# Patient Record
Sex: Female | Born: 1955 | Race: White | Hispanic: No | Smoking: Former smoker
Health system: Southern US, Community
[De-identification: ages and names within clinical notes are randomized; demographics above are authoritative.]

## PROBLEM LIST (undated history)

## (undated) DIAGNOSIS — M1712 Unilateral primary osteoarthritis, left knee: Secondary | ICD-10-CM

## (undated) DIAGNOSIS — M1990 Unspecified osteoarthritis, unspecified site: Secondary | ICD-10-CM

## (undated) DIAGNOSIS — E119 Type 2 diabetes mellitus without complications: Secondary | ICD-10-CM

## (undated) DIAGNOSIS — C801 Malignant (primary) neoplasm, unspecified: Secondary | ICD-10-CM

## (undated) DIAGNOSIS — F419 Anxiety disorder, unspecified: Secondary | ICD-10-CM

## (undated) DIAGNOSIS — F32A Depression, unspecified: Secondary | ICD-10-CM

## (undated) DIAGNOSIS — F329 Major depressive disorder, single episode, unspecified: Secondary | ICD-10-CM

## (undated) DIAGNOSIS — I10 Essential (primary) hypertension: Secondary | ICD-10-CM

## (undated) DIAGNOSIS — R55 Syncope and collapse: Secondary | ICD-10-CM

## (undated) DIAGNOSIS — Z8489 Family history of other specified conditions: Secondary | ICD-10-CM

## (undated) DIAGNOSIS — J189 Pneumonia, unspecified organism: Secondary | ICD-10-CM

## (undated) DIAGNOSIS — E7800 Pure hypercholesterolemia, unspecified: Secondary | ICD-10-CM

## (undated) DIAGNOSIS — J45909 Unspecified asthma, uncomplicated: Secondary | ICD-10-CM

## (undated) HISTORY — PX: ABDOMINAL HYSTERECTOMY: SHX81

## (undated) HISTORY — DX: Pure hypercholesterolemia, unspecified: E78.00

## (undated) HISTORY — PX: JOINT REPLACEMENT: SHX530

## (undated) HISTORY — PX: DILATION AND CURETTAGE OF UTERUS: SHX78

## (undated) HISTORY — PX: BACK SURGERY: SHX140

## (undated) NOTE — Assessment & Plan Note (Signed)
 Formatting of this note might be different from the original.  Problem: Acute Pain Goal: Improve-Acute Pain Description: Pain that persists over time -- Improve Flowsheets (Taken 01/10/2024 0403) Acute pain outcomes: Stabilizing  Electronically signed by Joen LITTIE Pinal at 01/10/2024  4:03 EST

## (undated) NOTE — Assessment & Plan Note (Signed)
 Formatting of this note is different from the original. Images from the original note were not included. Physical Therapy Evaluation-Inpatient  Patient Name:  Tiffany Robles   Date of Birth:  Oct 27, 1956  MRN: [id number]    LOS: 0 days Attending Physician: Ramiro LITTIE Clerk, MD  Problem List Patient Active Problem List  Diagnosis   Closed fracture of right tibial plateau   Past Medical History Past Medical History:   Anxiety   Chronic kidney disease (CKD), stage III (moderate) (CMS-HCC)   Diabetes mellitus (CMS, HHS-HCC)   Hyperlipidemia   Hypertension   Neuropathy   Past Surgical History Past Surgical History:  Procedure Laterality Date   BACK SURGERY     HYSTERECTOMY         ICD-10-CM   1. Closed fracture of lateral portion of right tibial plateau, initial encounter  S82.121A    2. Acute pain of right knee  M25.561    3. Impaired mobility and ADLs  Z74.09    Z78.9       Precautions Weight bearing right lower extremity: Non-weight bearing Bracing: R Hinged knee (unlocked) General precautions: Fall;Skin Other (comment): right tib plat fx (non op) unlocked hinged knee brace, PMHx: L TKA(approximately 3 years ago), anxiety, neuropathy  Subjective/Pain Assessment   Pt. Met in ED hallway, pleasant and agreeable to therapy. I just want to go home. Pt. With high levels of R knee pain with all mobility, improved with rest. RN aware.   Objective Evaluation    Cognition - 01/08/24 1729      Cognition   Overall cognitive status Within Functional Limits    Direction following Exceptions to [facility]    Exceptions to direction following Multi-step   extra time.   Attention span Attends with cues to redirect    Following commands Follows multi-step commands with increased time;Follows multi-step commands with repetition;Follows one-step commands without difficulty    Safety judgement Decreased awareness of need for assistance     Communication   Communication Within  functional limits          Home/PLOF - 01/08/24 1728      Living Situation   Lives with Significant other    Type of home House    Home layout One-level    Entrance Stairs    Exterior stairs 2    Bathroom shower/tub Walk-in shower    Bathroom toilet Tall toilet    Bathroom equipment Grab bars    Home Equipment Walker, front wheel    Other (comment) FWW from TKA, has not used recently.     Level of ADL Independence   Level of independence Independence with ADLs;Independence with transfers    General mobility Independence with ambulation    Other (comment) Was in [place] on vacation, fall during tub transfer.          Lower Extremity - 01/08/24 1730      RLE Assessment   RLE assessment Exceptions to [facility]   ankle PF/DF 4/5, unable to perform SLR but can perform quad set.    LLE Assessment   LLE assessment Within Functional Limits          Sensation - 01/08/24 1731      Sensation   Light touch No apparent deficits          Bed Mobility/Transfers - 01/08/24 1731      Bed Mobility   Supine to sit Moderate assist;Head of bed elevated;Rails used   extra time, assist with RLE   Sit  to supine Moderate assist;Rails used   extra time   Scooting Maximum assist;Two person   to [facility]   Lateral Moderate assist;Two person;Left   extra time, pt. only able to perform one short seated side scoot 2/2 pain    Transfers   Sit to stand Moderate assist;Two person   initially 2p modA for half stand, with time and education on goal for d/c,pt. was able to fully upright to [facility]   Assistive device used for sit to stand Walker, front wheel    Stand to sit Moderate assist    Assistive device used for stand to sit Vannie, front wheel          GaitWC Skills - 01/08/24 1733      Gait Skills   Level of independence Moderate assist (50% patient's effort)    Weight-bearing restrictions Nonweight-bearing, right    Assistive device Walker, front wheel    Gait distance 1  heel-toe side step with FWW, pt. unable to hop or complete functional side step or pivot; quickly fatigues     Gait Analysis   Impairments contributing to gait deviations Decreased strength;Decreased endurance;Acute pain          Balance - 01/08/24 1734      Balance   Static sitting balance Contact guard assist    Dynamic sitting balance Contact guard assist;Minimal assist   2/2 pain and pt. not wanting to fully place RLE on floor   Static standing balance Contact guard assist;Minimal assist   with FWW   Dynamic standing balance Minimal assist   FWW         Assessment    Assessment - 01/08/24 1735      Assessment   Mobility assessment Decline in ambulation;Decreased ADL status;Decreased LE strength;Decreased safe judgement;Decreased activity tolerance;Decreased self-care;Decreased high-level ADLs;Decreased functional ability;Decreased balance;Fall risk;Gait instability;Generalized weakness;Impaired gait;Impaired transfers;Decreased LE ROM    Overall assessment Pt. presented to ED s/p fall from shower while on vacation in [place]. At baseline, pt. is IND with amb and all ADLs. Today, she presents below mobility baseline 2/2 high levels of pain, new RLE NWB status, and decreased activity tolerance. She will benefit from continued PT to progress her mobility. She hopes to d/c back to hotel and then drive home to [facility] tomorrow. Long discussion with pt. and boyfriend regarding the assist level pt. required with hospital bed and inability to fully stand pivot to get to w/c. With discussion, pt. agreeable to continued PT.    Rehab potential Good for stated goals    Prognosis Good;With continued PT status post acute discharge;Ongoing PT assessment needed          Goals - 01/08/24 1743      Short Term Goals   Short term goal 1 sup<>sit CGA to improve bed mobility    Short term goal 2 sit<>stand to FWW minA to improve functional mobility    Short term goal 3 bed<>w/c transfer minA  to improve transfers and mobility    Short term goal 4 Demos understanding of NWB RLE with all mobility.     Long Term Goals   Long term goal 1 Return to PLOF without falls or complications.    Anticipated to be met by end of hospital stay No         Plan/Recommendations    Plan/Recommendations - 01/08/24 1745      Plan/Recommendations   Treatment/Interventions Gait training;Transfer training;Bed mobility;Therapeutic exercise;Therapeutic activity;Strengthening;Endurance;Patient/Family education;Neuro re-education;Balance;Wheelchair mobility;Equipment evaluation/education    PT IP frequency  3-5x/week;5-7x/week    Duration LOS or until goals met     Recommendations   PT Discharge recommendations Supervision/assistance recommended for mobility and ADL's;Post-acute therapy needed to maximize functional status given current deficits    Post-acute therapy recommendation Patient needs therapy at 3-5 days per week.   Pt. wishes to d/c home with [facility] however, not able to complete transfers at eval, would not be safe to d/c at this time.    Dietitian, front wheel;Wheelchair   R elevating leg rest        Time Calculation  Eval Time Calculation - 01/08/24 1747      PT Eval Time Calculation   Start time 1630    Stop time 1645    Time calculation (min) 15          Treatment Time Calculation     Row Name 01/08/24 1747    PT Visit Status   PT Visit Status --    PT Treat Time Calculation   Start time 1645   Stop time 1700   Time calculation (min) 15        Goals and treatment plan discussed and agreed upon with patient and family  Treatment will continue unless new orders are written  High complexity due to 3 or more social or personal factors impacting care including patient is past/current experience, behavior pattern, prior living conditions, prior physical limitations. Please see above for details for prior living  environment and PLOF. Patient demonstrates with four or more elements from any of the following body structures and function, activity limitation, and/or participation restrictions that needs to be addressed during the evaluation including the following decreased functional mobility, difficulty with transfers/ambulation, abilities to complete ADLs. Please see above for details about PMH and problem list. Patient demonstrated with unstable clinical presentation with changing characteristics.   Evaluation completed for 15 minutes with treatment followed by 15 minutes for TA to facilitate return to PLOF   Education:  Role, purpose, goals of acute PT. Educated on safety awareness, energy conservation, and fall prevention during functional mobility. Pt verbalized understanding.   Supine > Sit:  modA, cued pt. To complete quad set prior to moving RLE towards midline to exit bed to her L, cues for deep nasal breathing to avoid holding breath, needs assist at R heel, pt. Very fearful of R knee bending/pain.   Sitting balance:  minA 2/2 pain, progressed to SBA, needs assist using draw sheet and chuck to get feet to floor.   Sit < > stand: x2 attempts from slightly elevated EOB. First attempt modA x2 to FWW, pt. Unable to come to full stand with hip and trunk flexion. Second attempt, after discussion that pt. Would need to be able to at least stand to d/c tonight, pt. Able to complete full stand modA x1 + minA x1 to FWW.   Ambulation:  Pt. Unable to complete with RLE NWB, did take one heel-toe side step with FWW but quickly fatigued and needed to sit back down.   Sit > Supine:  modA to scoop legs and assist at trunk.modA x2 to boost in bed.   Tolerance to activity: fair  Pt left resting in bed, sig other at bedside, aware to ask Rns for any other needs.    [place] M Pester, PT 01/08/2024 17:48   Cosigned by Ramiro LITTIE Clerk, MD at 01/09/2024 16:45 EST Electronically signed by [place] M Pester at 01/08/2024  17:52 EST Electronically signed by  Ramiro LITTIE Clerk, MD at 01/09/2024 16:45 EST

## (undated) NOTE — Consults (Signed)
 Formatting of this note is different from the original.    Orthopaedic Trauma Surgery Consult  Date of Consult: 01/08/2024   Requesting Clinician: Dr. Niki  Reason for Consult: Right tibial plateau fracture  Subjective:    CC:  Knee Pain (Pt c/o R knee pain after slipping getting out of shower. Unable to bear weight. Denies hitting head/ blood thinners. )   HPI:  15 y.o. female with a PMH of HTN and DM2 who presents emergency department with complaint of right knee pain status post fall.  Patient was getting out of the shower when she slipped and fell sustaining injury to the right knee.  Upon presentation she was evaluated by the emergency department team where imaging was obtained and she was diagnosed with a right tibial plateau fracture.  Orthopedics has been consulted for the patient's right knee fracture.  The patient was seen in the emergency department with no family present at the bedside.  Patient reports right knee pain.  Pain is elicited with direct pressure placed about the proximal tibia and knee as well as movement of the right knee.  Pain improves with immobilization and pain medication.  Pain does not radiate from the knee.  She reports no numbness or tingling of the right leg.  She denies any additional complaints.  She does report sustaining prior right knee fracture in the past which was treated without surgery.    PAST MEDICAL HISTORY Past Medical History:   Anxiety   Diabetes mellitus (CMS, HHS-HCC)   Hypertension   PAST SURGICAL HISTORY Past Surgical History:  Procedure Laterality Date   BACK SURGERY     HYSTERECTOMY     -Left total knee arthroplasty - [place]   MEDICATIONS No current facility-administered medications for this encounter.   No current outpatient medications on file.   Prior to Admission medications   Not on File    ALLERGIES Not on File  SOCIAL HISTORY Social History   Socioeconomic History   Marital status: Single  Tobacco  Use   Smoking status: Unknown   FAMILY HISTORY No family history on file.    REVIEW OF SYSTEMS:  General: neg fever, chills, weight changes, no new food allergies HEENT: neg swollen lymph nodes, congestion, dental problems, environmental allergies, eye discharge, vision changes, hearing loss CV: neg chest pain, irregular heartbeat Resp: neg cough, cough with blood, shortness of breath, wheezing GI: neg abdominal pain, bloody or black tarry stool, diarrhea, nausea or vomiting GU: neg difficulty urinating, loss of bladder control, pain or burning with urination, blood in urine   Endocrine: neg excessive appetite, excessive thirst, excessive urination Musc:  Positive right knee pain. Neuro: neg fainting spells, numbness, headaches or migraines Psych: neg agitation, memory changes  Hemo: neg easy bruising, bleeding tendencies  Objective:    BP 144/76   Pulse 92   Temp 36.6 C (97.9 F)   Resp 16   Ht 1.6 m (5' 3)   Wt 210 lb   SpO2 92%   BMI 37.20 kg/m   Physical Exam:  - General: Healthy appearing.  Well-developed.  Well-nourished.  Apparent age appears consistent with stated age.  No acute distress.  Cooperative.  Pleasant and conversational.  Obese.  - Psych: Alert and oriented x3.  Normal affect.  Normal mood.   Examination of the RUE:   -SKIN: No erythema or ecchymosis. No skin lesions or masses.    -MUSCULOSKELETAL:  No obvious deformity.  No tenderness to palpation of the hand, wrist, radius,  ulna, humerus, or shoulder girdle.  No crepitus with palpation of the limb.   No swelling noted.  Active range of motion of the shoulder, elbow, wrist, and hand pain free and full.  No mechanical blocks or crepitus with motion of the shoulder, elbow, wrist, or hand.  -NEURO: Sensation to light touch intact in the distributions of the radial, median, ulnar, and axillary nerves.  Positive extensor pollicis longus, abductor digiti minimi, abductor pollicis brevis, and interosseous  muscle active motion.    -VASCULAR:  Capillary refill <2 seconds. 2+ radial pulse. No palpable edema.   Examination of the LUE:    -SKIN: No erythema or ecchymosis. No skin lesions or masses.     -MUSCULOSKELETAL:  No obvious deformity.  No tenderness to palpation of the hand, wrist, radius, ulna, humerus, or shoulder girdle.  No crepitus with palpation of the limb.   No swelling noted.  Active range of motion of the shoulder, elbow, wrist, and hand pain free and full.  No mechanical blocks or crepitus with motion of the shoulder, elbow, wrist, or hand.  -NEURO: Sensation to light touch intact in the distributions of the radial, median, ulnar, and axillary nerves.  Positive extensor pollicis longus, abductor digiti minimi, abductor pollicis brevis, and interosseous muscle active motion.    -VASCULAR:  Capillary refill <2 seconds. 2+ radial pulse. No palpable edema.   Examination of the RLE:   -SKIN:  Swelling present of the knee.  No erythema or ecchymosis.  No skin lesions or masses.     -MUSCULOSKELETAL:  Tenderness to palpation about the knee and proximal tibia.  No additional tenderness to palpation about the limb.  No crepitus with palpation of the limb.  All compartments of the lower extremity are soft and compressible.  No pain with passive range of motion of the toes in flexion or extension.  Active range of motion of the hip, ankle, and foot are pain free.  Range of motion of the knee elicits pain.  No hip pain with log roll.     -NEURO: Sensation to light touch intact in the distributions of the superficial peroneal nerve, deep peroneal nerve, and tibial nerve distributions.  Positive extensor hallucis longus, tibialis anterior, and gastrocsoleus complex active motion.    -VASCULAR: Capillary refill <2 seconds. 2+ dorsalis pedis pulse.  No palpable edema.  Examination of the LLE:   -SKIN: No erythema or ecchymosis.  No skin lesions or masses.  -MUSCULOSKELETAL:  No tenderness to palpation  of the foot, ankle, tibia, fibula, patella, femur, or hip.  No crepitus with palpation of the limb.  No gross swelling noted.  All compartments of the lower extremity are soft and compressible.  No pain with passive range of motion of the toes in flexion or extension.  Active range of motion of the hip, knee, ankle, and foot pain free.  No pain with log roll.  Patient able to perform straight leg raise.    -NEURO: Sensation to light touch intact in the distributions of the superficial peroneal nerve, deep peroneal nerve, and tibial nerve distributions.  Positive extensor hallucis longus, tibialis anterior, and gastrocsoleus complex active motion.    -VASCULAR: Capillary refill <2 seconds. 2+ dorsalis pedis pulse.  No palpable edema.  PELVIS: Stable to compression.  No pain with compression of the pelvis.  GAIT: Unable to assess at this time.    IMAGING: X-ray imaging of the right tibia/fibula personally reviewed.  Lateral depression fracture noted of the tibial plateau.  No dislocations noted.  No additional fractures noted.  CT imaging of the right knee personally reviewed.  There is a posterior lateral depression fracture of the right tibial plateau.  No additional fractures or dislocations are noted.  Radiology: X-ray hip right with pelvis 2-3 views  Result Date: 01/07/2024 Cortical irregularity along the lateral tibial plateau better appreciated on this study, suspicious for nondepressed fracture. Interpreted By: Belvie Mullinix                                 Electronically signed by: Belvie Mullinix  01/07/2024 11:10 [id number] Report created in [facility] [employer].   X-ray femur right 2 views  Result Date: 01/07/2024 Cortical irregularity along the lateral tibial plateau better appreciated on this study, suspicious for nondepressed fracture. Interpreted By: Belvie Mullinix                                 Electronically signed by: Belvie Mullinix  01/07/2024 11:10 [id number] Report  created in [facility] [employer].   X-ray tibia fibula right AP and lateral  Result Date: 01/07/2024 Cortical irregularity along the lateral tibial plateau better appreciated on this study, suspicious for nondepressed fracture. Interpreted By: Belvie Mullinix                                 Electronically signed by: Belvie Mullinix  01/07/2024 11:10 [id number] Report created in [facility] [employer].   X-ray knee right AP lateral and axial  Result Date: 01/07/2024 No acute bony injury. Mild degenerative changes of the knee, with small knee joint effusion. Interpreted By: Belvie Mullinix                                 Electronically signed by: Belvie Mullinix  01/07/2024 9:02 [id number] Report created in [facility] [employer].    Assessment & Plan    Diagnosis:   Closed right tibial plateau fracture  Plan:   Injury discussed with the patient.  Treatment options in the form of operative and non operative management were discussed.  The patient may benefit from surgical fixation right tibial plateau fracture.  Recommend she remain nonweightbearing to the right lower extremity.  Further treatment planning will follow once advanced imaging is further reviewed by our team.  She is being admitted to the hospitalist service for medical management and pain control.  Hold chemical prophylaxis at this time.  Remain NPO at this time.  Knee immobilizer applied to the right lower extremity at the bedside, this is to remain in place at this time.  Further treatment planning to follow up.   Deward Mosie Raddle., PA 01/08/24 0408   Charlie JONETTA Pouch, MD 01/08/24 1237  Cosigned by Charlie JONETTA Pouch, MD at 01/08/2024 12:37 EST Electronically signed by Deward Mosie Raddle., PA at 01/08/2024  4:05 EST Electronically signed by Deward Mosie Raddle., PA at 01/08/2024  4:08 EST Electronically signed by Charlie JONETTA Pouch, MD at 01/08/2024 12:37 EST  Associated attestation - Pouch Charlie JONETTA, MD - 01/08/2024 1237 EST Formatting  of this note might be different from the original. I have seen and examined Ms. Essner.  I have reviewed all available imaging.  I agree with the above findings, assessment, and plan of care.   The following  portions of the patient's history were reviewed and confirmed with the patient: allergies, current medications, past family history, past medical history, past social history and past surgical history.  Physical exam:  Right upper extremity:  No appreciable skin lacerations, abrasions, ecchymoses, edema Full range of motion of the shoulder, elbow, wrist, fingers Nontender to palpation over the long bones or bony prominences. 5/5 strength with thumb extension, A-Okay sign, finger abduction  Sensation intact to light touch 2/2 in the radial, median, ulnar nerve distributions Radial pulse 2 +, capillary refill less than 2 seconds  Left upper extremity:  No appreciable skin lacerations, abrasions, ecchymoses, edema Full range of motion of the shoulder, elbow, wrist, fingers Nontender to palpation over the long bones or bony prominences. 5/5 strength with thumb extension, A-Okay sign, finger abduction  Sensation intact to light touch 2/2 in the radial, median, ulnar nerve distributions Radial pulse 2 +, capillary refill less than 2 seconds  Right lower extremity:  No appreciable skin lacerations, abrasions, ecchymoses, edema  log roll and axial load deferred Tender to palpation about the knee, otherwise nontender to palpation over the long bones and bony prominences  4/5 strength with EHL/FHL, ankle dorsiflexion/plantar flexion.  Unable to perform straight leg raise  Sensation intact to light touch 2/2 in the sural, saphenous, superficial peroneal, deep peroneal, tibial nerve distributions  DP/PT pulses 2+, capillary refill less than 2 seconds  Anterior, lateral, superficial posterior compartments soft and compressible  Left lower extremity:  No appreciable skin lacerations,  abrasions, ecchymoses, edema  Nontender with log roll and axial load Nontender to palpation over the long bones and bony prominences  5/5 strength with EHL/FHL, ankle dorsiflexion/plantar flexion.  Able to perform straight leg raise  Sensation intact to light touch 2/2 in the sural, saphenous, superficial peroneal, deep peroneal, tibial nerve distributions  DP/PT pulses 2+, capillary refill less than 2 seconds   On review of her radiographs, Ms. Aranas has a lateral depression right tibial plateau fracture. CT reveals 4mm of depression  We have discussed the risks and benefits of continued nonoperative care versus operative intervention.  Specifically, we reviewed the low risks of permanent deformity, residual limb dysfunction, and chronic pain with conservative management of Schatzker III tibial plateau fracture, as well as the benefits of avoiding surgical risk.  We then reviewed the risks of general anesthesia (cardiac, pulmonary, and neurologic complications) as well the risks of surgical intervention.  We reviewed the specific risks of wound complications, infection, blood vessel damage, bleeding requiring a blood transfusion, nerve damage, malunion despite surgical intervention, nonunion despite surgical intervention, and the likely result of bony union and return to normal function with or without surgery.  Ms. Steinhilber understood this and the rationale for treating without surgery.  She agreed and wished to proceed with nonoperative treatment to avoid surgical risk.  She understands that this problem may progress to the point that the need for surgical intervention significantly outweighs the risks, and that we will need to observe closely for any interval changes.  She will return to clinic in 1 week for repeat radiographs of her right knee.  She will be nonweightbearing to the right lower extremity in an unlocked hinged knee brace. She plans to follow up with an orthopedist closer to home in  [place] . Recommend follow up in 1-2 weeks.

## (undated) NOTE — Assessment & Plan Note (Signed)
 Formatting of this note might be different from the original.  Problem: Acute Pain Goal: Improve-Acute Pain Description: Pain that persists over time -- Improve Flowsheets (Taken 01/09/2024 1007) Acute pain outcomes: Improving  Electronically signed by Sallyann Horsfall, RN at 01/09/2024 10:07 EST

## (undated) NOTE — Progress Notes (Signed)
 Formatting of this note is different from the original. Images from the original note were not included.  HOSPITAL MEDICINE DAILY PROGRESS NOTE Hospital Day: 2  Patient Identifiers:  Tiffany Robles 29 y.o.female DOB: [birth year]  MRN: [id number] ED Bed Date/Time:  01/07/2024 19:53  Attending Physician:  Cinderella Bien, MD    Patient Care Team: Provider Unknown Or Not Found as PCP - General  MDM ASSESSMENT/PLAN  Tiffany Robles is a 41 y.o. year old female chief complaint of right knee pain PMHx hypertension, hyperlipidemia, type 2 diabetes mellitus, CKD stage 3, neuropathy.   Closed right tibial plateau fracture  Multimodal pain management Left lower extremity mechanical DVT prophylaxis only  Most effective pain control with bowel regimen  Per ortho nonweightbearing right lower extremity, pain control medical management per primary team.  DVT prophylaxis per the primary team, she will need 6 weeks of chemical prophylaxis at the time of discharge.  He has knee brace remain in place to the right lower extremity.  Patient is to be unlocked for full range of motion of the knee.  PT/OT.  Medical records in this imaging are to be prior to the patient prior to discharge per her follow up in [place]  with her established orthopedic surgeon.  Detailed discussion was had with the patient about operative nonoperative management of her right tibial plateau fracture.  Patient had decided to proceed with the attempted course of nonoperative management of her right tibial plateau Soto's fraction.  Patient is okay for discharge from orthopedic standpoint once medically stable to do so per primary team.  Patient also to follow up with her established orthopedic surgeon wants 2 weeks.  If patient remains events she is to follow up with orthopedist who had seen her inpatient. PT/OT supervised assistance recommended for mobility and ADLs post-acute therapy needed to maximize functional status given current  deficits Type 2 diabetes mellitus  Monitor blood glucose ISS continue   AKI resolved   Case management consulted for discharge planning   I have reviewed the relevant elements of the check-list below with the team and addressed the issues pertinent DVT prophylaxis:  Lovenox  Class: Inpatient Code Status: Full Code Medication Reconciliation  Foley Catheter: N/A  Isolation: N/A Adult diet Type: Regular; Modifier: Heart healthy, Sodium restriction, Consistent carbohydrate; Sodium restriction: 2 gm Bowel regimen addressed   DISPOSITION  I have evaluated the patient on 01/10/2024 for discharge planning needs.  It is my recommendation that the following post acute services are needed to support a safe discharge plan post hospitalization.  Payor: MR [employer] DUAL AND DSNP / Plan: MR [employer] DUAL AND DSNP / Product Type: *No Product type* /  Barriers to progression/discharge:  Tentative Medically stable for discharge date:  Tentative Recommended Next Site of Care:  Tentative  SUBJECTIVE HISTORY   Interval History:  Patient is out of bed.  No new complaints.  Patient endorses that she is waiting for PT/OT because patient endorses that she does not feel full strength to be discharge and would like more time with PT.  Medications SCHEDULED  Scheduled Medications  Medication Dose Route Frequency Provider Last Rate Last Admin   atorvastatin   80 mg Oral Daily Shumet L Adnew, MD   80 mg at 01/10/24 0924   dapagliflozin propanediol  5 mg Oral Daily Shumet L Adnew, MD   5 mg at 01/10/24 9076   enoxaparin   40 mg Subcutaneous Daily Shumet L Adnew, MD   40 mg at 01/10/24 0924   escitalopram  oxalate  10 mg Oral Daily Shumet L Adnew, MD   10 mg at 01/10/24 9076   gabapentin   600 mg Oral BID Shumet L Adnew, MD   600 mg at 01/10/24 9076   insulin  lispro  0-10 Units Subcutaneous AC HS Shumet L Adnew, MD   3 Units at 01/10/24 9075   metoprolol  tartrate  50 mg Oral BID Shumet L Adnew, MD   50 mg at 01/10/24 9076    multivitamin with folic acid  1 tablet Oral Daily Shumet L Adnew, MD   1 tablet at 01/10/24 9076   traZODone   150 mg Oral Bedtime Shumet L Adnew, MD   150 mg at 01/09/24 2311   AS NEEDED  cyclobenzaprine , 10 mg, TID PRN [facility] Subcutaneous Sliding Scale Insulin  Protocol, , PRN  And insulin  lispro, 3-10 Units, PRN  And glucose, 12-24 g, PRN  And dextrose  50% in water , 12.5-25 g, PRN  And glucagon, 1 mg, PRN HYDROmorphone , 0.5 mg, Q3H PRN hydrOXYzine , 25 mg, Q8H PRN oxyCODONE , 5 mg, Q4H PRN   INFUSIONS  Infusions  Medication Dose Last Rate    EXAM   PHYSICAL EXAM: VITALS INTAKE/OUTPUT  BP 138/68   Pulse 79   Temp 36.6 C (97.9 F) (Oral)   Resp 17   Ht 1.6 m (5' 2.99)   Wt 97.1 kg (214 lb)   SpO2 95%   Breastfeeding No   BMI 37.92 kg/m  ----- Temp:  [36.6 C (97.9 F)-37.2 C (99 F)] 36.6 C (97.9 F) Heart Rate:  [73-88] 79 Resp:  [1-18] 17 BP: (125-161)/(68-84) 138/68 O2 Flow Rate (L/min): 2 (02/09 0749) SpO2:  [91 %-96 %] 95 % (02/09 1240)  Intake/Output Summary (Last 24 hours) at 01/10/2024 1326 Last data filed at 01/09/2024 1951 Gross per 24 hour  Intake --  Output 600 ml  Net -600 ml   Net IO Since Admission: -1,643.75 mL [01/10/24 1326]   Physical Exam Vitals and nursing note reviewed.  Constitutional:      Appearance: Normal appearance.  HENT:     Head: Normocephalic and atraumatic.     Right Ear: External ear normal.     Left Ear: External ear normal.     Nose: Nose normal.     Mouth/Throat:     Pharynx: Oropharynx is clear.  Eyes:     Extraocular Movements: Extraocular movements intact.     Conjunctiva/sclera: Conjunctivae normal.  Cardiovascular:     Rate and Rhythm: Normal rate.     Pulses: Normal pulses.  Pulmonary:     Effort: Pulmonary effort is normal.  Abdominal:     General: Bowel sounds are normal.  Musculoskeletal:        General: Normal range of motion.     Cervical back: Normal range of motion.     Comments: Right lower  extremity in brace  Skin:    General: Skin is warm.  Neurological:     General: No focal deficit present.     Mental Status: She is alert and oriented to person, place, and time.  Psychiatric:        Mood and Affect: Mood normal.     Access/Catheter: Patient Lines/Drains/Airways Status     Active Lines, Drains & Airways     Name Placement date Placement time Site Days   Peripheral IV 01/08/24 Left Antecubital 01/08/24  0258  Antecubital  2        LABS/RADIOLOGY RESULTS   Imaging: CT knee right without contrast  Result Date: 01/08/2024  [id number] HISTORY: fx CT KNEE RIGHT WO CONTRAST COMPARISON: Tib-fib x-ray January 07, 2024 TECHNIQUE: CT right knee without contrast. Coronal and sagittal reformats were obtained. FINDINGS: Comminuted, depressed lateral tibial plateau fracture with involvement of the tibial spine. Subtle acute nondisplaced medial tibial plateau fracture. Visualized proximal fibula is intact. Visualized distal femur is intact. Right knee joint appears congruent with moderate lipohemarthrosis. Infiltrating edema along the soft tissues of the knee.   Acute comminuted, depressed lateral tibial plateau fracture with involvement of the tibial spine. Subtle acute nondepressed medial tibial plateau fracture. Moderate volume right knee lipohemarthrosis. Report Created in [employer]. Interpreted By: Natha Forte, MD                                Electronically signed by: Allana Blanch, MD  01/08/2024 4:22 [id number] Report created in [employer].   X-ray hip right with pelvis 2-3 views  Result Date: 01/07/2024 [id number], [id number], [id number] XR TIBIA FIBULA RIGHT AP AND LATERAL, XR 2+V FEMUR RIGHT, XR HIP W PEL 2-3V RIGHT HISTORY: injury COMPARISON: Right knee x-ray same day. FINDINGS: Bones: Cortical irregularity along the lateral tibial plateau better appreciated on this study. Joints: Normal. Soft Tissues: Small knee joint effusion.   Cortical  irregularity along the lateral tibial plateau better appreciated on this study, suspicious for nondepressed fracture. Interpreted By: Belvie Mullinix                                 Electronically signed by: Belvie Mullinix  01/07/2024 11:10 [id number] Report created in [employer].   X-ray femur right 2 views  Result Date: 01/07/2024 [id number], [id number], [id number] XR TIBIA FIBULA RIGHT AP AND LATERAL, XR 2+V FEMUR RIGHT, XR HIP W PEL 2-3V RIGHT HISTORY: injury COMPARISON: Right knee x-ray same day. FINDINGS: Bones: Cortical irregularity along the lateral tibial plateau better appreciated on this study. Joints: Normal. Soft Tissues: Small knee joint effusion.   Cortical irregularity along the lateral tibial plateau better appreciated on this study, suspicious for nondepressed fracture. Interpreted By: Belvie Mullinix                                 Electronically signed by: Belvie Mullinix  01/07/2024 11:10 [id number] Report created in [employer].   X-ray tibia fibula right AP and lateral  Result Date: 01/07/2024 [id number], [id number], [id number] XR TIBIA FIBULA RIGHT AP AND LATERAL, XR 2+V FEMUR RIGHT, XR HIP W PEL 2-3V RIGHT HISTORY: injury COMPARISON: Right knee x-ray same day. FINDINGS: Bones: Cortical irregularity along the lateral tibial plateau better appreciated on this study. Joints: Normal. Soft Tissues: Small knee joint effusion.   Cortical irregularity along the lateral tibial plateau better appreciated on this study, suspicious for nondepressed fracture. Interpreted By: Belvie Mullinix                                 Electronically signed by: Belvie Mullinix  01/07/2024 11:10 [id number] Report created in [employer].   X-ray knee right AP lateral and axial  Result Date: 01/07/2024 [id number] XR KNEE RIGHT AP LATERAL AND AXIAL HISTORY: injury COMPARISON: None. FINDINGS: Bones: No acute fracture. Joints: Mild tricompartmental joint space narrowing. Soft  Tissues: Small knee joint effusion.   No acute bony injury. Mild degenerative changes  of the knee, with small knee joint effusion. Interpreted By: Belvie Mullinix                                 Electronically signed by: Belvie Mullinix  01/07/2024 9:02 [id number] Report created in [employer].   No results found.  EKG: No results found for this visit on 01/07/24 (from the past 36 hours).  Labs:  Recent Results (from the past 48 hours)  GLUCOSE POCT   Collection Time: 01/08/24 17:42  Result Value Ref Range   Glucose POCT 108 65 - 110 mg/dL  GLUCOSE POCT   Collection Time: 01/08/24 20:59  Result Value Ref Range   Glucose POCT 163 (H) 65 - 110 mg/dL  CBC w/auto diff   Collection Time: 01/09/24  1:48  Result Value Ref Range   WBC 8.0 4.5 - 11.0 K/uL   RBC 4.18 3.80 - 5.20 M/uL   Hemoglobin 12.3 11.7 - 16.1 g/dL   Hematocrit 61.3 64.9 - 47.0 %   MCV 92.3 81.0 - 102.0 fL   MCH 29.4 25.0 - 35.0 pg   MCHC 31.9 30.0 - 36.0 g/dL   RDW 86.7 88.4 - 85.3 %   MPV 8.7 No Established Reference Range fL   Neutrophils % 70.4 (H) 40.0 - 70.0 %   Lymphocytes % 18.3 15.0 - 45.0 %   Monocytes % 8.1 (H) 1.0 - 8.0 %   Eosinophils % 2.9 0.0 - 6.0 %   BASO % 0.3 0.0 - 2.0 %   MONO # 0.7 No Established Reference Range K/uL   EOS # 0.2 No Established Reference Range K/uL   BASO # 0.0 No Established Reference Range K/uL   Platelets 189 150 - 450 K/uL   nRBC 0 0 - 0 /100WBC   LYMPH # 1.5 No Established Reference Range K/uL   ABS NEUT # 5.63 No Established Reference Range K/uL  Basic metabolic panel   Collection Time: 01/09/24  1:48  Result Value Ref Range   Creatinine 1.02 0.52 - 1.25 mg/dL   Glucose 852.20 (H) 74 - 106 mg/dL   Sodium 865.89 (L) 862 - 145 mMOL/L   Potassium 4.3 3.5 - 5.1 mMOL/L   Chloride 100.05 98 - 107 mMOL/L   CO2 28 21 - 31 mMOL/L   Anion Gap 10 10 - 20 mMOL/L   BUN 16 7 - 18 mg/dL   Calcium  8.9 8.5 - 10.5 mg/dL   eGFR (7978) 60 (L) >39 mL/min/1.73 m2  GLUCOSE  POCT   Collection Time: 01/09/24  9:18  Result Value Ref Range   Glucose POCT 227 (H) 65 - 110 mg/dL  GLUCOSE POCT   Collection Time: 01/09/24 11:26  Result Value Ref Range   Glucose POCT 185 (H) 65 - 110 mg/dL  GLUCOSE POCT   Collection Time: 01/09/24 17:30  Result Value Ref Range   Glucose POCT 234 (H) 65 - 110 mg/dL  GLUCOSE POCT   Collection Time: 01/09/24 20:18  Result Value Ref Range   Glucose POCT 213 (H) 65 - 110 mg/dL  GLUCOSE POCT   Collection Time: 01/10/24  7:42  Result Value Ref Range   Glucose POCT 169 (H) 65 - 110 mg/dL  GLUCOSE POCT   Collection Time: 01/10/24 12:55  Result Value Ref Range   Glucose POCT 259 (H) 65 - 110 mg/dL   DISCLAIMER: This chart was created using M-Modal dictation software. Efforts were made by  me to ensure accuracy, however some errors may be present due to limitations of this technology and occasionally words are not transcribed as intended.  Electronically Signed:    Cinderella Bien, MD 01/10/24 1326  Electronically signed by Cinderella Bien, MD at 01/10/2024 13:26 EST

## (undated) NOTE — ED Provider Notes (Signed)
 Formatting of this note is different from the original. History   Chief Complaint  Patient presents with   Knee Pain    Pt c/o R knee pain after slipping getting out of shower. Unable to bear weight. Denies hitting head/ blood thinners.    Reason for ED Visit (v.PCP/UC):   Patient arrives complaining of acute severe right knee pain after an accidental slip and fall shower.  Unable to bear weight.  History provided by:  EMS personnel Knee Pain  Past Medical History:   Anxiety   Chronic kidney disease (CKD), stage III (moderate) (CMS-HCC)   Diabetes mellitus (CMS, HHS-HCC)   Hyperlipidemia   Hypertension   Neuropathy   Past Surgical History:  Procedure Laterality Date   BACK SURGERY     HYSTERECTOMY     History reviewed. No pertinent family history.  Social History   Socioeconomic History   Marital status: Single  Tobacco Use   Smoking status: Former    Types: Cigarettes   Smokeless tobacco: Never  Vaping Use   Vaping status: Never Used  Substance and Sexual Activity   Alcohol use: Never   Drug use: Never   Additional Smoking   E-Cigarettes/Vaping Use   E-Cigarette/Vaping Use never user    E-Cigarette/Vaping Substances   Review of Systems  Constitutional:        A complete review of systems was performed with this patient. All pertinent positives and negatives were included in the HPI and otherwise review of systems is negative.   Physical Exam   ED Triage Vitals [01/07/24 1939]  BP 156/68  Heart Rate 86  Resp 18  Temp 37 C (98.6 F)  SpO2 100 %  O2 Device RA  O2 Flow Rate (L/min)    Physical Exam Vitals and nursing note reviewed.  Constitutional:      General: She is not in acute distress.    Appearance: Normal appearance.  HENT:     Head: Normocephalic and atraumatic.     Nose: Nose normal.     Mouth/Throat:     Mouth: Mucous membranes are moist.     Pharynx: Oropharynx is clear.  Eyes:     Extraocular Movements: Extraocular movements  intact.     Conjunctiva/sclera: Conjunctivae normal.     Pupils: Pupils are equal, round, and reactive to light.  Cardiovascular:     Rate and Rhythm: Normal rate and regular rhythm.     Heart sounds: Normal heart sounds. No murmur heard. Pulmonary:     Effort: Pulmonary effort is normal. No respiratory distress.     Breath sounds: Normal breath sounds. No wheezing.  Abdominal:     General: There is no distension.     Palpations: Abdomen is soft.     Tenderness: There is no abdominal tenderness. There is no guarding.  Musculoskeletal:        General: Swelling and tenderness present.     Cervical back: Normal range of motion and neck supple.  Skin:    General: Skin is warm and dry.  Neurological:     General: No focal deficit present.     Mental Status: She is alert and oriented to person, place, and time. Mental status is at baseline.     Cranial Nerves: No cranial nerve deficit.  Psychiatric:        Mood and Affect: Mood normal.        Behavior: Behavior normal.        Thought Content: Thought content normal.  Judgment: Judgment normal.   ED Course    Procedure(s): Procedures  EKG: Results for orders placed or performed during the hospital encounter of 01/07/24  ECG 12 lead  Result Value Ref Range   Ventricular Rate 90 BPM   Atrial Rate 90 BPM   PR Interval 148 ms   QRS Duration 84 ms   QT Interval 368 ms   QTC Calculation 450 ms   P Axis 41 degrees   R Axis -10 degrees   T Wave Axis 27 degrees   Impression   Normal sinus rhythm Low voltage QRS Septal infarct , age undetermined Abnormal ECG     Laboratory Data: Results for orders placed or performed during the hospital encounter of 01/07/24  Comprehensive metabolic panel   Collection Time: 01/08/24  3:07  Result Value Ref Range   Glucose 121.76 (H) 74 - 106 mg/dL   Sodium 862.90 862 - 854 mMOL/L   Potassium 4.3 3.5 - 5.1 mMOL/L   Chloride 99.75 98 - 107 mMOL/L   CO2 28 21 - 31 mMOL/L   Anion Gap  14 10 - 20 mMOL/L   BUN 29 (H) 7 - 18 mg/dL   Creatinine 8.49 (H) 9.47 - 1.25 mg/dL   Calcium  8.9 8.5 - 10.5 mg/dL   Protein 6.2 6.0 - 8.4 g/dL   Albumin  3.9 3.0 - 5.0 g/dL   A/G Ratio 1.7 >=8.8   Globulin 2.3 1.5 - 3.8 g/dL   Total Bilirubin 0.6 0.2 - 1.3 mg/dL   AST 39 5 - 49 U/L   Alkaline Phosphatase 61.79 38 - 126 U/L   ALT 33 3 - 35 U/L   eGFR (2021) 37 (L) >60 mL/min/1.73 m2  CBC w/auto diff   Collection Time: 01/08/24  3:07  Result Value Ref Range   WBC 10.4 4.5 - 11.0 K/uL   RBC 4.21 3.80 - 5.20 M/uL   Hemoglobin 12.3 11.7 - 16.1 g/dL   Hematocrit 61.6 64.9 - 47.0 %   MCV 91.0 81.0 - 102.0 fL   MCH 29.2 25.0 - 35.0 pg   MCHC 32.1 30.0 - 36.0 g/dL   RDW 86.6 88.4 - 85.3 %   MPV 8.3 No Established Reference Range fL   Neutrophils % 67.7 40.0 - 70.0 %   Lymphocytes % 23.0 15.0 - 45.0 %   Monocytes % 7.8 1.0 - 8.0 %   Eosinophils % 1.2 0.0 - 6.0 %   BASO % 0.3 0.0 - 2.0 %   MONO # 0.8 No Established Reference Range K/uL   EOS # 0.1 No Established Reference Range K/uL   BASO # 0.0 No Established Reference Range K/uL   Platelets 194 150 - 450 K/uL   nRBC 0 0 - 0 /100WBC   LYMPH # 2.4 No Established Reference Range K/uL   ABS NEUT # 7.05 No Established Reference Range K/uL  Protime-INR   Collection Time: 01/08/24  3:07  Result Value Ref Range   PT Patient 10.8 9.9 - 13.6 Sec   INR 0.96 0.88 - 1.20  APTT   Collection Time: 01/08/24  3:07  Result Value Ref Range   PTT Patient 28.5 26.2 - 37.2 Sec  HIV-1 and HIV-2 antibodies   Collection Time: 01/08/24  3:07  Result Value Ref Range   S/CO-HIV 0.11    HIV AG/AB Nonreactive Nonreactive  Hepatitis C antibody   Collection Time: 01/08/24  3:07  Result Value Ref Range   Hepatitis C Antibodies Nonreactive Nonreactive  Type and  screen   Collection Time: 01/08/24  3:07  Result Value Ref Range   ABORh O Positive    Antibody Screen Negative   GLUCOSE POCT   Collection Time: 01/08/24  4:49  Result Value Ref Range    Glucose POCT 130 (H) 65 - 110 mg/dL  GLUCOSE POCT   Collection Time: 01/08/24 11:03  Result Value Ref Range   Glucose POCT 116 (H) 65 - 110 mg/dL  GLUCOSE POCT   Collection Time: 01/08/24 17:42  Result Value Ref Range   Glucose POCT 108 65 - 110 mg/dL   Imaging: Results for orders placed or performed during the hospital encounter of 01/07/24  X-ray knee right AP lateral and axial   Narrative   [id number]  XR KNEE RIGHT AP LATERAL AND AXIAL  HISTORY: injury   COMPARISON: None.  FINDINGS:  Bones: No acute fracture.  Joints: Mild tricompartmental joint space narrowing.  Soft Tissues: Small knee joint effusion.   Impression   No acute bony injury. Mild degenerative changes of the knee, with small knee joint effusion.  Interpreted By: Belvie Mullinix                                 Electronically signed by: Belvie Mullinix  01/07/2024 9:02 [id number] Report created in [facility] [employer].  X-ray hip right with pelvis 2-3 views   Narrative   [id number], [id number], [id number]  XR TIBIA FIBULA RIGHT AP AND LATERAL, XR 2+V FEMUR RIGHT, XR HIP W PEL 2-3V RIGHT  HISTORY: injury   COMPARISON: Right knee x-ray same day.  FINDINGS:  Bones: Cortical irregularity along the lateral tibial plateau better appreciated on this study.  Joints: Normal.  Soft Tissues: Small knee joint effusion.   Impression   Cortical irregularity along the lateral tibial plateau better appreciated on this study, suspicious for nondepressed fracture.  Interpreted By: Belvie Mullinix                                 Electronically signed by: Belvie Mullinix  01/07/2024 11:10 [id number] Report created in [facility] [employer].  X-ray femur right 2 views   Narrative   [id number], [id number], [id number]  XR TIBIA FIBULA RIGHT AP AND LATERAL, XR 2+V FEMUR RIGHT, XR HIP W PEL 2-3V RIGHT  HISTORY: injury   COMPARISON: Right knee x-ray same  day.  FINDINGS:  Bones: Cortical irregularity along the lateral tibial plateau better appreciated on this study.  Joints: Normal.  Soft Tissues: Small knee joint effusion.   Impression   Cortical irregularity along the lateral tibial plateau better appreciated on this study, suspicious for nondepressed fracture.  Interpreted By: Belvie Mullinix                                 Electronically signed by: Belvie Mullinix  01/07/2024 11:10 [id number] Report created in [facility] [employer].  X-ray tibia fibula right AP and lateral   Narrative   [id number], [id number], [id number]  XR TIBIA FIBULA RIGHT AP AND LATERAL, XR 2+V FEMUR RIGHT, XR HIP W PEL 2-3V RIGHT  HISTORY: injury   COMPARISON: Right knee x-ray same day.  FINDINGS:  Bones: Cortical irregularity along the lateral tibial plateau better appreciated on this study.  Joints: Normal.  Soft Tissues: Small knee joint effusion.   Impression   Cortical irregularity along the lateral  tibial plateau better appreciated on this study, suspicious for nondepressed fracture.  Interpreted By: Belvie Mullinix                                 Electronically signed by: Belvie Mullinix  01/07/2024 11:10 [id number] Report created in [facility] [employer].  CT knee right without contrast   Narrative   [id number]  HISTORY: fx  CT KNEE RIGHT WO CONTRAST  COMPARISON: Tib-fib x-ray January 07, 2024  TECHNIQUE:  CT right knee without contrast. Coronal and sagittal reformats were obtained.  FINDINGS: Comminuted, depressed lateral tibial plateau fracture with involvement of the tibial spine. Subtle acute nondisplaced medial tibial plateau fracture. Visualized proximal fibula is intact. Visualized distal femur is intact.  Right knee joint appears congruent with moderate lipohemarthrosis.  Infiltrating edema along the soft tissues of the knee.   Impression   Acute comminuted, depressed lateral tibial plateau  fracture with involvement of the tibial spine.  Subtle acute nondepressed medial tibial plateau fracture.  Moderate volume right knee lipohemarthrosis.  Report Created in [facility] [employer].  Interpreted By: Natha Forte, MD                                 Electronically signed by: Allana Blanch, MD  01/08/2024 4:22 [id number] Report created in [facility] [employer].   Most Recent Vitals: BP: 173/74 (01/08/24 1825) Heart Rate: 103 (01/08/24 1825) Resp: 15 (01/08/24 1825) Temp: 37.2 C (99 F) (01/08/24 1421) SpO2: 93 % (01/08/24 1825) O2 Device: Room air (01/08/24 1825)    Medical Decision Making Right knee x-ray initially showed no obvious acute bony injury however I was highly suspicious of fracture.  X-rays of the right femur and tibia and fibula now demonstrate what seems to be likely lateral tibial plateau fracture.    I did obtain a CT scan of the right knee to confirm and this demonstrated an acute comminuted depressed lateral tibial plateau fracture with involvement of the tibial spine.  Also an acute nondepressed medial plateau fracture.  Patient was given Dilaudid  1 mg IM followed by 1 mg IV.  She was given 1000 mL normal saline bolus.    EKG showed no signs of acute ischemia or dysrhythmia and a rate of 90, please see chart for full interpretation.    Laboratory studies were basically normal.    I did place an emergent consultation with the on-call orthopedist and the patient will be admitted at this time and further disposition management be per the admitting team  Amount and/or Complexity of Data Reviewed Independent Historian: EMS Labs: ordered. Decision-making details documented in ED Course. Radiology: ordered and independent interpretation performed. Decision-making details documented in ED Course. ECG/medicine tests: ordered and independent interpretation performed. Decision-making details documented in ED Course.  Risk Prescription drug  management. Parenteral controlled substances. Decision regarding hospitalization. Emergency major surgery.  ED CONSULT   None   ED REEVALUATION   None   Clinical Impression: Diagnoses     Diagnosis Comment Added By Time Added   Closed fracture of lateral portion of right tibial plateau, initial encounter  Selinda GORMAN Beagle, MD 01/08/2024  3:43   Acute pain of right knee  Selinda GORMAN Beagle, MD 01/08/2024  3:43     Disposition:  Was admitted and treated  Attending/admitting provider: carrington, amy l [[id number]] Estimated length of stay: two or more midnights Admission certification: i  certify that the patient status is appropriate and is based on my best clinical judgment and the patient?s condition as documented in the medical record. Admitting diagnosis: tibial plateau fracture [[id number]]   Selinda GORMAN Beagle, MD 01/08/24 1927  Electronically signed by Selinda GORMAN Beagle, MD at 01/08/2024 19:27 EST

## (undated) NOTE — Progress Notes (Signed)
 Formatting of this note is different from the original. Medication History Pharmacy Progress Note  Tiffany Robles  is a 1 y.o. female; DOB: [birth year]  Home Medications  Outpatient Medications Marked as Taking for the 01/07/24 encounter [facility] Encounter)  Medication Sig   atorvastatin  (LIPITOR) 80 MG tablet Take 1 tablet (80 mg total) by mouth daily   cyclobenzaprine  (FLEXERIL ) 10 MG tablet Take 1 tablet (10 mg total) by mouth Daily as needed   escitalopram  oxalate (LEXAPRO ) 10 MG tablet Take 1 tablet (10 mg total) by mouth daily   FARXIGA 5 mg Tablet tablet Take 1 tablet (5 mg total) by mouth daily   gabapentin  (NEURONTIN ) 600 MG tablet Take 1 tablet (600 mg total) by mouth in the morning and 1 tablet (600 mg total) before bedtime.   hydrOXYzine  (ATARAX ) 25 MG tablet Take 1 tablet (25 mg total) by mouth Every 8 (eight) hours as needed for Itching or Anxiety   LANTUS  SOLOSTAR U-100 INSULIN  100 unit/mL (3 mL) Insulin  Pen insulin  pen Inject 40 Units into the skin bedtime   lisinopriL  (ZESTRIL ) 40 MG tablet Take 1 tablet (40 mg total) by mouth daily   metFORMIN  (GLUCOPHAGE ) 500 MG tablet Take 1 tablet (500 mg total) by mouth in the morning and 1 tablet (500 mg total) before bedtime.   metoprolol  tartrate (LOPRESSOR ) 50 MG tablet Take 1 tablet (50 mg total) by mouth in the morning and 1 tablet (50 mg total) before bedtime.   multivitamin per tablet Take 1 tablet by mouth daily   traZODone  (DESYREL ) 150 MG tablet Take 1 tablet (150 mg total) by mouth bedtime   TRULICITY  4.5 mg/0.5 mL Pen Injector Inject 4.5 mg into the skin once a week     Medication History Source of Information: Pt + MedHx/Dispense Report. Pharmacy confirmed. PCP changed to Annabella Rigg FNP. Allergies updated to include augmentin and contrast.  Medication History/Interview: The patient is a good historian and very adherent. PCP DC humalog and mounjaro; pt takes a multivitamin otc. Pt takes lantus  in the mornings, and pt  states mounjaro caused weight gain.     Med rec conducted by: Harlene GORMAN Ohara 01/08/2024 10:18  Medications not taken as prescribed: Lantus  AM instead of bedtime  Pharmacist Recommendations   Past Medical History:   Anxiety   Chronic kidney disease (CKD), stage III (moderate) (CMS-HCC)   Diabetes mellitus (CMS, HHS-HCC)   Hyperlipidemia   Hypertension   Neuropathy   Consider resuming Lexapro , Atorvastatin , Metoprolol ,  Gabapentin , Trazodone  if clinically indicated.   All medications and pertinent labs reviewed. Consider restarting current home medications as clinically indicated. No other recommendations at this time.  Marshal Baller, PharmD Medication Reconciliation Pharmacist Ext [phone] 01/08/24 14:33  Disclaimer: The home medication history is based on claim history provided, PDMP, interview of patients/family, and/or contacting reported pharmacies or facilities. This history is obtained to the best of our ability and dependent on information provided by the patient/family and may not contain all medications.    Medications Discontinued During This Encounter  Medication Reason   HYDROmorphone  (DILAUDID ) injection 1 mg    insulin  lispro (HUMALOG) 100 unit/mL Insulin  Pen Discontinued by another clinician   metoprolol  succinate (TOPROL -XL) 50 MG 24 hr tablet Error   MOUNJARO 2.5 mg/0.5 mL Pen Injector Discontinued by another clinician    Electronically signed by Harlene GORMAN Ohara at 01/08/2024 10:20 EST Electronically signed by Marshal LELON Baller, PharmD at 01/08/2024 14:33 EST Electronically signed by Marshal LELON Baller, PharmD at 01/08/2024 14:34  EST Electronically signed by Marshal LELON Baller, PharmD at 01/08/2024 14:35 EST

## (undated) NOTE — Discharge Summary (Signed)
 Formatting of this note is different from the original. Physician Discharge Summary: Patient Name Tiffany Robles Age/Sex 25 y.o./female  Date of Birth 1956/06/17 MRN [id number]  Admit Date 01/07/2024 LOS 3 days  Admit Diagnosis Tibial plateau fracture [S82.143A] Impaired mobility and ADLs [Z74.09, Z78.9] Acute pain of right knee [M25.561] Closed fracture of lateral portion of right tibial plateau, initial encounter [S82.121A] Discharge Diagnosis Closed fracture of right tibial plateau  Admitting Physician Greig LITTIE Sailors, MD Discharging Physician Carlin CHRISTELLA Dublin, MD   Problem List: Active Hospital Problems    Closed fracture of right tibial plateau  Resolved  Hospital Problems   * No resolved hospital problems. *  Discharge Date:  01/11/2024  Discharged Condition: stable  Hospital Course/Discharge Exam and Assessment: Chief Complaint  Patient presents with   Knee Pain      Pt c/o R knee pain after slipping getting out of shower. Unable to bear weight. Denies hitting head/ blood thinners.     History of Present Illness:  Tiffany Robles is a 12 y.o. year old female with PMHx significant for HTN, HLD, IDDM, CKD stage 3, neuropathy who presented to emergency department with complaint of right knee pain after fall.  Patient reports that she was getting out of the tub when she slipped and fell on to her knee and heard a snap.  She notes that since then she has pain and been unable to weight-bear.  Patient denies hitting her head or syncope.  She denies fever, cough, chest pain, palpitations,, vomiting, abdominal pain or changes in bladder and bowel habits.  She was admitted to the hospitalist service with a closed fracture of the right tibial plateau and seen by Orthopedic surgery who offered patient both operative and nonoperative management. Patient has opted to pursue nonoperative management at this time.    PT also recommended post acute therapy but patient has declined inpatient rehab  placement and will like to be discharged home at this time.  She is cleared for discharge in overall fair condition to follow-up with her PCP within 1-2 weeks of discharge as well as with out-of-town orthopedic team closer to her home address in [place]  in the next 2 weeks.  Inpatient Specialty Consults:  Orthopedic surgery  Follow-up Ordered by Discharging Physician: The following labs are in progress     No pending labs for this visit     Post-discharge Referrals     Follow-up with Home Health     Please evaluate Janese Supinski for admission to Home Health.  Disciplines requested: Physical Therapy and Occupational Therapy  Services to provide: Strengthening Exercises and Evaluate  Physician to follow patient's care (the person listed here will be responsible for signing ongoing orders): PCP  Requested Start of Care Date: Within 2-3 days  Special Instructions:  N/a   When to Follow-up: 3 days   Follow-up with Orthopedic Surgeon     When to Follow-up: 2 weeks   Follow-up with Primary Care Provider in 1-2 weeks     When to Follow-up: in 1-2 weeks     Patient Discharge Instructions: Activity Instructions     Discharge Activity     Discharge activity: As tolerated     Patient Special Instructions:   Patient Instructions     Ortho Patient information: 1. Please follow up in the Orthopedic clinic in 1-2 weeks with her established orthopedic surgeon.  If you remain in the [facility] area then please contact our office at [phone] for an appointment on 01/14/2024.  2. Remain nonweightbearing on the right leg using an assistive device such as crutches or walker while ambulating. 3. Keep the hinged knee brace in place to the right leg.  Braces to be unlocked to allow for full right knee range of motion. 4. Elevate and ice as needed in order to minimize swelling and pain.  5. Patient is advised to get up and move around as tolerated in order to reduce the risk of  blood clots.  Discharge Medications:  Medication List    START taking these medications    oxyCODONE  5 MG immediate release tablet Take 1 tablet (5 mg total) by mouth Every 4 (four) hours as needed Quantity: 20 tablet Refills: 0 Commonly known as: ROXICODONE   polyethylene glycol 17 gram packet Take 17 g by mouth Daily as needed Quantity: 14 each Refills: 0 Commonly known as: MIRALAX      CONTINUE taking these medications    atorvastatin  80 MG tablet Take 1 tablet (80 mg total) by mouth daily Refills: 0 Commonly known as: LIPITOR  cyclobenzaprine  10 MG tablet Take 1 tablet (10 mg total) by mouth Daily as needed Refills: 0 Commonly known as: FLEXERIL   escitalopram  oxalate 10 MG tablet Take 1 tablet (10 mg total) by mouth daily Refills: 0 Commonly known as: LEXAPRO   FARXIGA 5 mg Tab tablet Take 1 tablet (5 mg total) by mouth daily Refills: 0 Generic drug: dapagliflozin propanediol  gabapentin  600 MG tablet Take 1 tablet (600 mg total) by mouth in the morning and 1 tablet (600 mg total) before bedtime. Refills: 0 Commonly known as: NEURONTIN   hydrOXYzine  25 MG tablet Take 1 tablet (25 mg total) by mouth Every 8 (eight) hours as needed for Itching or Anxiety Refills: 0 Commonly known as: ATARAX   LANTUS  SOLOSTAR U-100 INSULIN  100 unit/mL (3 mL) Inpn insulin  pen Inject 40 Units into the skin bedtime Refills: 0 Generic drug: insulin  glargine  lisinopriL  40 MG tablet Take 1 tablet (40 mg total) by mouth daily Refills: 0 Commonly known as: ZESTRIL   metFORMIN  500 MG tablet Take 1 tablet (500 mg total) by mouth in the morning and 1 tablet (500 mg total) before bedtime. Refills: 0 Commonly known as: GLUCOPHAGE   metoprolol  tartrate 50 MG tablet Take 1 tablet (50 mg total) by mouth in the morning and 1 tablet (50 mg total) before bedtime. Refills: 0 Commonly known as: LOPRESSOR   multivitamin per tablet Take 1 tablet by mouth daily Refills: 0  traZODone   150 MG tablet Take 1 tablet (150 mg total) by mouth bedtime Refills: 0 Commonly known as: DESYREL   TRULICITY  4.5 mg/0.5 mL Pnij Inject 4.5 mg into the skin once a week Refills: 0 Generic drug: dulaglutide       Where to Get Your Medications    These medications were sent to [id number] - [place], [place] - [address]  [address] [place] [address]    Phone: [phone]  oxyCODONE  5 MG immediate release tablet polyethylene glycol 17 gram packet   Prescriptions: eScript Discharge Management: Greater than 30 minutes Time spent on patient care (minutes): 37 50% or more spent counseling/coordination of care: Yes  Disposition: home with home health services  Emergency Instructions: Contact your regular physician if your condition worsens or changes unexpectedly, if you are not improving as expected, or if other unexpected problems arise.   If you feel like any changes in your condition constitute an emergency and require immediate attention, go to an Emergency Room or call 911.   Carlin HERO  Valaria, MD 01/11/24 1321   Carlin CHRISTELLA Valaria, MD 01/11/24 1322  Electronically signed by Carlin CHRISTELLA Valaria, MD at 01/11/2024 13:21 EST Electronically signed by Carlin CHRISTELLA Valaria, MD at 01/11/2024 13:22 EST

## (undated) NOTE — Assessment & Plan Note (Signed)
 Formatting of this note might be different from the original.  Problem: Acute Pain Goal: Improve-Acute Pain Description: Pain that persists over time -- Improve Flowsheets (Taken 01/08/2024 1745) Acute pain outcomes: Stabilizing  Electronically signed by Bernice Birmingham, RN at 01/08/2024 17:45 EST

## (undated) NOTE — Assessment & Plan Note (Signed)
 Formatting of this note might be different from the original.  Problem: Acute Pain Goal: Improve-Acute Pain Description: Pain that persists over time -- Improve Flowsheets (Taken 01/11/2024 0800) Acute pain outcomes: Stabilizing  Problem: Musculoskeletal Alteration Goal: Improve-Musculoskeletal Alteration Description: Change in or modification of the muscles, bones or support structures Flowsheets (Taken 01/11/2024 0800) Musculoskeletal Alteration Outcomes: Stabilizing  Problem: Infection Risk Goal: Stabilize- Infection Risk Description: Increased chance of contamination with disease-producing germs. Flowsheets (Taken 01/11/2024 0800) Infection Risk Outcomes: Stabilizing  Electronically signed by Twana Neysa Dee, RN at 01/11/2024 12:02 EST

## (undated) NOTE — Progress Notes (Signed)
 Formatting of this note is different from the original. Images from the original note were not included.  HOSPITAL MEDICINE DAILY PROGRESS NOTE Hospital Day: 0  Patient Identifiers:  Tiffany Robles 78 y.o.female DOB: Oct 05, 1956  MRN: [id number] ED Bed Date/Time:  01/07/2024 19:53  Attending Physician:  Ramiro LITTIE Clerk, MD    Patient Care Team: Provider Unknown Or Not Found as PCP - General  MDM ASSESSMENT/PLAN  Tiffany Robles is a 20 y.o. year old female chief complaint of right knee pain PMHx hypertension, hyperlipidemia, type 2 DM, CKD stage 3 and neuropathy .   Closed right tibial plateau fracture  - admitted to inpatient floor - optimize pain management - LLE mech DVT prophy only - lowest effective pain control with bowel regimen - Benefit > Risk to proceed to OR. Intermediate risk due to age and ortho procedure. Otherwise optimized - evaluated by Orthopedics who provided detailed information about surgical versus nonsurgical management and patient opted nonsurgical management - consulted PT/OT - case management consult for discharge placement  Chronic kidney disease - known baseline CKD - Cr at presentation 1.5 - avoid nephrotoxins and renally dose medications - monitor creatinine  Type 2 DM - on sliding scale insulin  - Accu-Chek monitoring and ADA diet - Check A1c level  Other chronic medical problems - hypertension, hyperlipidemia, neuropathy  I have reviewed the relevant elements of the check-list below with the team and addressed the issues pertinent DVT prophylaxis:  SCD Class: Inpatient Code Status: Full Code Medication Reconciliation  Foley Catheter: N/A  Isolation: N/A Adult diet Type: Regular Bowel regimen addressed   DISPOSITION  I have evaluated the patient on 01/08/2024 for discharge planning needs.  It is my recommendation that the following post acute services are needed to support a safe discharge plan post hospitalization.  Payor: MR [employer] DUAL  AND DSNP / Plan: MR [employer] DUAL AND DSNP / Product Type: *No Product type* /  Barriers to progression/discharge:  Severe pain, evaluation by PT/OT Medically stable for discharge date:  24-48 hours Recommended Next Site of Care: TBD  SUBJECTIVE HISTORY   Interval History:  Seen and examined by the bedside  Was not severe pain as well as feel like she is not able to pee by herself  No other complaints  Reviewed vital signs, labs, medications and findings discussed with patient and care team  Evaluated by Orthopedics and patient chose nonsurgical management  Consulted PT/OT and case management  Optimize pain management - added oxycodone  for moderate pain  Other pertinent systems reviewed and were negative apart from the ones mentioned in subjective review  Medications SCHEDULED  Scheduled Medications  Medication Dose Route Frequency Provider Last Rate Last Admin   insulin  lispro  0-10 Units Subcutaneous AC HS Euginie Charlery, NP       AS NEEDED  [facility] Subcutaneous Sliding Scale Insulin  Protocol, , PRN  And insulin  lispro, 3-10 Units, PRN  And glucose, 12-24 g, PRN  And dextrose  50% in water , 12.5-25 g, PRN  And glucagon, 1 mg, PRN HYDROmorphone , 0.5 mg, Q3H PRN oxyCODONE , 5 mg, Q4H PRN   INFUSIONS  Infusions  Medication Dose Last Rate   sodium chloride    75 mL/hr at 01/08/24 0459    EXAM   PHYSICAL EXAM: VITALS INTAKE/OUTPUT  BP 138/74   Pulse 93   Temp 37.2 C (99 F)   Resp 16   Ht 1.6 m (5' 3)   Wt 95.3 kg (210 lb)   SpO2 93%   BMI 37.20  kg/m  ----- Temp:  [36.6 C (97.9 F)-37.2 C (99 F)] 37.2 C (99 F) Heart Rate:  [85-101] 93 Resp:  [12-18] 16 BP: (123-156)/(67-84) 138/74 SpO2:  [86 %-100 %] 93 % (02/07 1421) No intake or output data in the 24 hours ending 01/08/24 1634 Net IO Since Admission: No IO data has been entered for this period [01/08/24 1634]   Physical Exam  Constitutional:  Alert and awake and well-developed, well-nourished, and  in no distress.  HENT:  Head: Normocephalic and atraumatic.  Eyes: Pupils are equal, round, and reactive to light. No scleral icterus.  Neck: Normal range of motion. No tracheal deviation present.  Cardiovascular: Normal rate, regular rhythm, normal heart sounds and intact distal pulses.   Pulmonary/Chest: Effort normal and breath sounds normal. No respiratory distress. No wheezes or Rhonchi.  Abdominal: Soft. Bowel sounds are normal. No distension. There is no tenderness.  Musculoskeletal:  Right knee with immobilizer in place.  Mild tenderness.  Limited range of motion. No edema.  Neurological: Alert and oriented to person, place, and time. No cranial nerve deficit.  Skin: Skin is warm and dry. No diaphoresis.   Psychiatric: Mood and affect normal.    Access/Catheter: Patient Lines/Drains/Airways Status     Active Lines, Drains & Airways     Name Placement date Placement time Site Days   Peripheral IV 01/08/24 Left Antecubital 01/08/24  0258  Antecubital  less than 1        LABS/RADIOLOGY RESULTS   Imaging: CT knee right without contrast  Result Date: 01/08/2024 [id number] HISTORY: fx CT KNEE RIGHT WO CONTRAST COMPARISON: Tib-fib x-ray January 07, 2024 TECHNIQUE: CT right knee without contrast. Coronal and sagittal reformats were obtained. FINDINGS: Comminuted, depressed lateral tibial plateau fracture with involvement of the tibial spine. Subtle acute nondisplaced medial tibial plateau fracture. Visualized proximal fibula is intact. Visualized distal femur is intact. Right knee joint appears congruent with moderate lipohemarthrosis. Infiltrating edema along the soft tissues of the knee.   Acute comminuted, depressed lateral tibial plateau fracture with involvement of the tibial spine. Subtle acute nondepressed medial tibial plateau fracture. Moderate volume right knee lipohemarthrosis. Report Created in [facility] [employer]. Interpreted By: Natha Forte, MD                                 Electronically signed by: Allana Blanch, MD  01/08/2024 4:22 [id number] Report created in [facility] [employer].   X-ray hip right with pelvis 2-3 views  Result Date: 01/07/2024 [id number], [id number], [id number] XR TIBIA FIBULA RIGHT AP AND LATERAL, XR 2+V FEMUR RIGHT, XR HIP W PEL 2-3V RIGHT HISTORY: injury COMPARISON: Right knee x-ray same day. FINDINGS: Bones: Cortical irregularity along the lateral tibial plateau better appreciated on this study. Joints: Normal. Soft Tissues: Small knee joint effusion.   Cortical irregularity along the lateral tibial plateau better appreciated on this study, suspicious for nondepressed fracture. Interpreted By: Belvie Mullinix                                 Electronically signed by: Belvie Mullinix  01/07/2024 11:10 [id number] Report created in [employer].   X-ray femur right 2 views  Result Date: 01/07/2024 [id number], [id number], [id number] XR TIBIA FIBULA RIGHT AP AND LATERAL, XR 2+V FEMUR RIGHT, XR HIP W PEL 2-3V RIGHT HISTORY: injury COMPARISON: Right knee x-ray same day. FINDINGS: Bones: Cortical irregularity along the  lateral tibial plateau better appreciated on this study. Joints: Normal. Soft Tissues: Small knee joint effusion.   Cortical irregularity along the lateral tibial plateau better appreciated on this study, suspicious for nondepressed fracture. Interpreted By: Belvie Mullinix                                 Electronically signed by: Belvie Mullinix  01/07/2024 11:10 [id number] Report created in [facility] [employer].   X-ray tibia fibula right AP and lateral  Result Date: 01/07/2024 [id number], [id number], [id number] XR TIBIA FIBULA RIGHT AP AND LATERAL, XR 2+V FEMUR RIGHT, XR HIP W PEL 2-3V RIGHT HISTORY: injury COMPARISON: Right knee x-ray same day. FINDINGS: Bones: Cortical irregularity along the lateral tibial plateau better appreciated on this study. Joints: Normal. Soft Tissues: Small knee joint effusion.   Cortical  irregularity along the lateral tibial plateau better appreciated on this study, suspicious for nondepressed fracture. Interpreted By: Belvie Mullinix                                 Electronically signed by: Belvie Mullinix  01/07/2024 11:10 [id number] Report created in [facility] [employer].   X-ray knee right AP lateral and axial  Result Date: 01/07/2024 [id number] XR KNEE RIGHT AP LATERAL AND AXIAL HISTORY: injury COMPARISON: None. FINDINGS: Bones: No acute fracture. Joints: Mild tricompartmental joint space narrowing. Soft Tissues: Small knee joint effusion.   No acute bony injury. Mild degenerative changes of the knee, with small knee joint effusion. Interpreted By: Belvie Mullinix                                 Electronically signed by: Belvie Mullinix  01/07/2024 9:02 [id number] Report created in [facility] [employer].   CT knee right without contrast  Result Date: 01/08/2024 Acute comminuted, depressed lateral tibial plateau fracture with involvement of the tibial spine. Subtle acute nondepressed medial tibial plateau fracture. Moderate volume right knee lipohemarthrosis. Report Created in [facility]. Interpreted By: Natha Forte, MD                                Electronically signed by: Allana Blanch, MD  01/08/2024 4:22 [id number] Report created in [facility] [employer].   X-ray hip right with pelvis 2-3 views  Result Date: 01/07/2024 Cortical irregularity along the lateral tibial plateau better appreciated on this study, suspicious for nondepressed fracture. Interpreted By: Belvie Mullinix                                 Electronically signed by: Belvie Mullinix  01/07/2024 11:10 [id number] Report created in [employer].   X-ray femur right 2 views  Result Date: 01/07/2024 Cortical irregularity along the lateral tibial plateau better appreciated on this study, suspicious for nondepressed fracture. Interpreted By: Belvie Mullinix                                 Electronically  signed by: Belvie Mullinix  01/07/2024 11:10 [id number] Report created in [facility] [employer].   X-ray tibia fibula right AP and lateral  Result Date: 01/07/2024 Cortical irregularity along the lateral tibial plateau better appreciated on this study, suspicious  for nondepressed fracture. Interpreted By: Belvie Mullinix                                 Electronically signed by: Belvie Mullinix  01/07/2024 11:10 [id number] Report created in [facility] [employer].   X-ray knee right AP lateral and axial  Result Date: 01/07/2024 No acute bony injury. Mild degenerative changes of the knee, with small knee joint effusion. Interpreted By: Belvie Mullinix                                 Electronically signed by: Belvie Mullinix  01/07/2024 9:02 [id number] Report created in [facility] [employer].    EKG: Results for orders placed or performed during the hospital encounter of 01/07/24 (from the past 36 hours)  ECG 12 lead  Result Value Ref Range   Ventricular Rate 90 BPM   Atrial Rate 90 BPM   PR Interval 148 ms   QRS Duration 84 ms   QT Interval 368 ms   QTC Calculation 450 ms   P Axis 41 degrees   R Axis -10 degrees   T Wave Axis 27 degrees   Impression   Normal sinus rhythm Low voltage QRS Septal infarct , age undetermined Abnormal ECG   Labs:  Recent Results (from the past 48 hours)  Comprehensive metabolic panel   Collection Time: 01/08/24  3:07  Result Value Ref Range   Glucose 121.76 (H) 74 - 106 mg/dL   Sodium 862.90 862 - 854 mMOL/L   Potassium 4.3 3.5 - 5.1 mMOL/L   Chloride 99.75 98 - 107 mMOL/L   CO2 28 21 - 31 mMOL/L   Anion Gap 14 10 - 20 mMOL/L   BUN 29 (H) 7 - 18 mg/dL   Creatinine 8.49 (H) 9.47 - 1.25 mg/dL   Calcium  8.9 8.5 - 10.5 mg/dL   Protein 6.2 6.0 - 8.4 g/dL   Albumin  3.9 3.0 - 5.0 g/dL   A/G Ratio 1.7 >=8.8   Globulin 2.3 1.5 - 3.8 g/dL   Total Bilirubin 0.6 0.2 - 1.3 mg/dL   AST 39 5 - 49 U/L   Alkaline Phosphatase 61.79 38 - 126 U/L   ALT 33 3 - 35 U/L   eGFR  (2021) 37 (L) >60 mL/min/1.73 m2  CBC w/auto diff   Collection Time: 01/08/24  3:07  Result Value Ref Range   WBC 10.4 4.5 - 11.0 K/uL   RBC 4.21 3.80 - 5.20 M/uL   Hemoglobin 12.3 11.7 - 16.1 g/dL   Hematocrit 61.6 64.9 - 47.0 %   MCV 91.0 81.0 - 102.0 fL   MCH 29.2 25.0 - 35.0 pg   MCHC 32.1 30.0 - 36.0 g/dL   RDW 86.6 88.4 - 85.3 %   MPV 8.3 No Established Reference Range fL   Neutrophils % 67.7 40.0 - 70.0 %   Lymphocytes % 23.0 15.0 - 45.0 %   Monocytes % 7.8 1.0 - 8.0 %   Eosinophils % 1.2 0.0 - 6.0 %   BASO % 0.3 0.0 - 2.0 %   MONO # 0.8 No Established Reference Range K/uL   EOS # 0.1 No Established Reference Range K/uL   BASO # 0.0 No Established Reference Range K/uL   Platelets 194 150 - 450 K/uL   nRBC 0 0 - 0 /100WBC   LYMPH # 2.4 No Established Reference Range  K/uL   ABS NEUT # 7.05 No Established Reference Range K/uL  Protime-INR   Collection Time: 01/08/24  3:07  Result Value Ref Range   PT Patient 10.8 9.9 - 13.6 Sec   INR 0.96 0.88 - 1.20  APTT   Collection Time: 01/08/24  3:07  Result Value Ref Range   PTT Patient 28.5 26.2 - 37.2 Sec  Type and screen   Collection Time: 01/08/24  3:07  Result Value Ref Range   ABORh O Positive    Antibody Screen Negative   HIV-1 and HIV-2 antibodies   Collection Time: 01/08/24  3:07  Result Value Ref Range   S/CO-HIV 0.11    HIV AG/AB Nonreactive Nonreactive  Hepatitis C antibody   Collection Time: 01/08/24  3:07  Result Value Ref Range   Hepatitis C Antibodies Nonreactive Nonreactive  ECG 12 lead   Collection Time: 01/08/24  3:24  Result Value Ref Range   Ventricular Rate 90 BPM   Atrial Rate 90 BPM   PR Interval 148 ms   QRS Duration 84 ms   QT Interval 368 ms   QTC Calculation 450 ms   P Axis 41 degrees   R Axis -10 degrees   T Wave Axis 27 degrees  GLUCOSE POCT   Collection Time: 01/08/24  4:49  Result Value Ref Range   Glucose POCT 130 (H) 65 - 110 mg/dL  GLUCOSE POCT   Collection Time: 01/08/24  11:03  Result Value Ref Range   Glucose POCT 116 (H) 65 - 110 mg/dL   DISCLAIMER: This chart was created using M-Modal dictation software. Efforts were made by me to ensure accuracy, however some errors may be present due to limitations of this technology and occasionally words are not transcribed as intended.  Electronically Signed:    Ramiro LITTIE Clerk, MD 01/08/24 1640  Electronically signed by Ramiro LITTIE Clerk, MD at 01/08/2024 16:40 EST

## (undated) NOTE — Assessment & Plan Note (Signed)
 Formatting of this note is different from the original. Images from the original note were not included. Occupational Therapy Evaluation-Inpatient  Patient Name:  Tiffany Robles   Date of Birth:  August 13, 1956  MRN: [id number]    LOS: 0 days Attending Physician: Ramiro LITTIE Clerk, MD  Problem List Patient Active Problem List  Diagnosis   Closed fracture of right tibial plateau   Past Medical History Past Medical History:   Anxiety   Chronic kidney disease (CKD), stage III (moderate) (CMS-HCC)   Diabetes mellitus (CMS, HHS-HCC)   Hyperlipidemia   Hypertension   Neuropathy   Past Surgical History Past Surgical History:  Procedure Laterality Date   BACK SURGERY     HYSTERECTOMY         ICD-10-CM   1. Closed fracture of lateral portion of right tibial plateau, initial encounter  S82.121A    2. Acute pain of right knee  M25.561    3. Impaired mobility and ADLs  Z74.09    Z78.9       Precautions Weight bearing right lower extremity: Non-weight bearing Bracing: R Hinged knee (unlocked) General precautions: Fall Other (comment): right tib plat fx (non op), PMHx:  L TKA(approximately 3 years ago), anxiety  History of Present Condition  30 yo female admitted 01/07/2024 s/p fall.  She sustained a right tibial plateau fx which is being treated non operatively.  She is NWB RLE in hinged knee brace.  Subjective  I hate that this happened. Pain Assessment  Pain Assessment Pain Assessment: 0-10 Pain Score:   6 Pain Location: Knee Pain Orientation: Right Objective Evaluation    Cognition - 01/08/24 1715      Cognition   Overall cognitive status Within Functional Limits    Attention span Attends with cues to redirect    Following commands Follows multi-step commands with increased time    Safety judgement Good awareness of safety precautions     Communication   Communication Within functional limits          Home/PLOF - 01/08/24 1707      Living Situation   Lives  with Significant other   boyfriend Arley   Type of home [employer] layout One-level    Exterior stairs 2 steps to enter    [employer] shower/tub Walk-in shower    Bathroom toilet Tall    Secondary school teacher chair     Level of ADL Independence   Level of independence Independent with ADLs    General mobility Independent with ambulation        Patient is visiting from [place]   ADL Assessment - 01/08/24 8289      ADL Assessment   Dressing upper body Minimal assist    Dressing lower body Dependent    Toileting Maximum assist   has purewick but unable to urinate   Where assessed toileting Bed level    ADL Comments ADL assessment limited by patient hallway room in the ER and pain          Functional Mobility - 01/08/24 1712      Functional Mobility   Sit to stand Moderate assist    Stand to sit Minimal assist    Bed mobility Moderate assist    Assist comment prolonged transfer with maximum encouragement. physical assist with trunk and RLE    Functional transfer Unable to assess   patient unable to attempt transfer secondary to pain   Room mobility Unable to assess  Visual Perception - 01/08/24 1716      Basic Assessment   Current vision Wears glasses all the time          Upper Extremity - 01/08/24 1716      RUE Assessment   RUE assessment Within Functional Limits     LUE Assessment   LUE assessment Within Functional Limits          Sensation - 01/08/24 1717      Sensation   General sensation reports RLE feels numb at times         Assessment    Assessment - 01/08/24 1719      Assessment   Problem list Decreased ADL status;Decreased activity tolerance;Decreased functional mobility    Rehab potential Good for stated goals    Progress Progressing towards goals          Goals - 01/08/24 1723      Short Term Goals   Short term goal 1 Patient will dress LB with min A    Short term goal 2 Patient will  perform toilet transfer with min A     Long Term Goals   Long term goal 1 maximize ADL status         Plan/Recommendations    Plan/Recommendations - 01/08/24 1724      Treatment Plan   IP Frequency 1-5x/week    Treatment interventions ADL/IADL training;Functional mobility/transfers;Functional activity tolerance;Therapeutic activity;Patient/Family education     OT Recommendations   OT Discharge recommendation Supervision/assistance recommended for mobility and ADL's;Post-acute therapy needed to maximize functional status given current deficits    Post-acute therapy recommendation Patient needs therapy at 3-5 days per week.    Other recommended service PT    Equipment recommendation RW, BSC, WC         Time Calculation  Eval Time Calculation - 01/08/24 1726      OT Visit Status   OT Visit Status Visit completed     OT Eval Time Calculation   Start time 1610    Stop time 1630    Time calculation (min) 20         Moderate Complexity Evaluation due to expanded review of physical, cognitve, and psychosocial performance.  3-5 performance deficits relating to physical, cognitive and psychosocial limitations . Moderate analytic complexity, detailed assessments, minimal to moderate modifications of assessment, may have comorbidities affecting performance.  Goals and treatment plan discussed and agreed upon with patient  Treatment will continue unless new orders are written  Dorothyann Rummer, OT 01/08/2024 17:28   Cosigned by Ramiro LITTIE Clerk, MD at 01/09/2024 16:45 EST Electronically signed by Dorothyann Rummer, OT at 01/08/2024 17:34 EST Electronically signed by Ramiro LITTIE Clerk, MD at 01/09/2024 16:45 EST

## (undated) NOTE — Assessment & Plan Note (Signed)
 Formatting of this note is different from the original. Physical Therapy Progress Note  Patient Name:   Tiffany Robles Date of Birth:    1956-06-23  Today's Date:   January 11, 2024 Length of Stay: 3  Subjective   Pt agreeable for OOB.    Objective   Bed Mobility: SBA supine to sit Transfers: CG/SBA to stand with RW Gait: several small hops to chair using RW, RLE NWB in brace, SBA  Assessment   Able to xfer to chair with walker, SBA.  Will be mostly limited to W/C while NWB RLE.  Discharge recommendations:  Supervision/assistance recommended for mobility and ADL's.  DME recs:  walker for xfers; W/C with ELR for amb; BSC would be helpful   Plan   Home  TIME: 0800-0810  Medford Rumble, PT  01/11/2024    Electronically signed by Medford Rumble, PT at 01/11/2024  8:21 EST

## (undated) NOTE — Progress Notes (Signed)
 Formatting of this note is different from the original. Ortho Daily Progress Note Hospital Day: 1  Patient Name: Tiffany Robles Admission Date/Time: 01/07/2024 19:53  Age/Race/Sex:67 y.o. female MRN#: [id number]  DOB: 05-Feb-1956  Attending Provider: Greig LITTIE Sailors, MD    Subjective:  77 y.o. female s/p fall sustaining a right Schatzker 3 tibial plateau fracture.  Patient sitting up in bed on entering the room with family present at the bedside.  Patient reports pain is controlled.  No additional complaints.  No acute events or issues overnight.  Objective:   Vitals:   01/09/24 1951  BP: 148/74  Pulse: 86  Resp: 16  Temp: 37.2 C (99 F)  SpO2: 96%    Labs: Lab Results  Component Value Date   HGB 12.3 01/09/2024   HCT 38.6 01/09/2024   Lab Results  Component Value Date   WBC 8.0 01/09/2024   Lab Results  Component Value Date   K 4.3 01/09/2024   ANIONGAP 10 01/09/2024   BUN 16 01/09/2024   CALCIUM  8.9 01/09/2024   CL 100.05 01/09/2024   CO2 28 01/09/2024   CREATININE 1.02 01/09/2024   GLU 147.79 (H) 01/09/2024   NA 134.10 (L) 01/09/2024   Intake/Output:  I/O last 3 completed shifts: In: 1456.3 [P.O.:450; I.V.:1006.3] Out: 2500 [Urine:2500]  Drain:  None   Physical Exam:  General: A&Ox3, NAD, cooperative Right lower extremity Skin: No erythema, ecchymosis, or lesions noted of the exposed limb.     MSK:  Hinged knee brace unlocked in place.  TTP of the knee.  No additional TTP of the limb.  ROM of the ankle and foot are pain free.     Neurological: Sensation to light touch intact in the distributions of the superficial peroneal nerve, deep peroneal nerve, and tibial nerve. Positive extensor hallucis longus, tibialis anterior, and gastrocsoleus complex active motion. Vascular: 2+ dorsalis pedis pulse  Capillary refill <2 seconds.   Assessment & Plan    Principal Problem:   Closed fracture of right tibial plateau  1. NWB RLE 2. Pain control and medical  management per primary team.  3. DVT prophylaxis per the primary team, she will need 6 weeks of chemical prophylaxis at the time of discharge.  4. Hinged knee brace is to remain in place to the right lower extremity.  Braces to be unlocked for full range of motion of the knee 5. PT/OT 6. Medical records and imaging disc are to be provided to the patient prior to discharge for her follow up in [place]  with her established orthopedic surgeon. 7. Detailed discussion was had with the patient about operative and non operative management of her right tibial plateau fracture.  The patient again decided to proceed with an attempted course of non operative management of her right tibial plateau fracture.  Dispo:  The patient is okay for discharge from an orthopedic standpoint once medically safe to do so per the primary team.  The patient is to follow up with her established orthopedic surgeon in 1-2 weeks.  If she remains in the [facility] area at that time she may follow up in our clinic.     Deward Mosie Raddle., PA 01/09/24 2016  Cosigned by Charlie JONETTA Pouch, MD at 01/10/2024  7:42 EST Electronically signed by Deward Mosie Raddle., PA at 01/09/2024 20:16 EST Electronically signed by Charlie JONETTA Pouch, MD at 01/10/2024  7:42 EST

## (undated) NOTE — ED Notes (Signed)
 Formatting of this note might be different from the original. Pt placed on 2L [place]  Electronically signed by Richerd Crease, RN at 01/08/2024  4:56 EST Electronically signed by Richerd Crease, RN at 01/08/2024  4:56 EST

## (undated) NOTE — Progress Notes (Signed)
 Formatting of this note is different from the original. Images from the original note were not included.  HOSPITAL MEDICINE DAILY PROGRESS NOTE Hospital Day: 1  Patient Identifiers:  Tiffany Robles 9 y.o.female DOB: 11-02-1956  MRN: [id number] ED Bed Date/Time:  01/07/2024 19:53  Attending Physician:  Ramiro LITTIE Clerk, MD    Patient Care Team: Provider Unknown Or Not Found as PCP - General  MDM ASSESSMENT/PLAN  Tiffany Robles is a 26 y.o. year old female chief complaint of right knee pain PMHx hypertension, hyperlipidemia, type 2 DM, CKD stage 3 and neuropathy  Closed right tibial plateau fracture  - admitted to inpatient floor - optimize pain management - LLE mech DVT prophy only - lowest effective pain control with bowel regimen - Benefit > Risk to proceed to OR. Intermediate risk due to age and ortho procedure. Otherwise optimized - evaluated by Orthopedics who provided detailed information about surgical versus nonsurgical management and patient opted nonsurgical management - evaluated by PT/OT - needs therapy at 3-5 days per week.  Patient not safe to discharge home, she was not able to complete transfers at evaluation - case management consulted for discharge placement  Acute kidney injury - resolved - Cr at presentation 1.5 improved to 1 - avoid nephrotoxins and renally dose medications - monitor creatinine  Type 2 DM - on sliding scale insulin  - Accu-Chek monitoring and ADA diet - Check A1c level  Other chronic medical problems - hypertension, hyperlipidemia, neuropathy  I have reviewed the relevant elements of the check-list below with the team and addressed the issues pertinent DVT prophylaxis:  Start on Lovenox  Class: Inpatient Code Status: Full Code Medication Reconciliation  Foley Catheter: N/A  Isolation: N/A Adult diet Type: Regular; Modifier: Heart healthy, Sodium restriction; Sodium restriction: 2 gm Bowel regimen addressed   DISPOSITION  I have  evaluated the patient on 01/09/2024 for discharge planning needs.  It is my recommendation that the following post acute services are needed to support a safe discharge plan post hospitalization.  Payor: MR [employer] DUAL AND DSNP / Plan: MR [employer] DUAL AND DSNP / Product Type: *No Product type* /  Barriers to progression/discharge:  Severe pain, evaluation by PT/OT Medically stable for discharge date:  24-48 hours Recommended Next Site of Care: TBD  SUBJECTIVE HISTORY   Interval History:  Seen and examined by the bedside  States she feels much better today.  Comfortably lying in bed in no pain  No other complaints  Reviewed vital signs, labs, medications and findings discussed with patient and care team  Evaluated by PT/OT - needs therapy at 3-5 days per week.  Patient not safe to discharge home, as she was not able to complete transfers at evaluation.  Rehab discussed  Case management consulted for discharge planning  Continue PT/OT, and control pain  Other pertinent systems reviewed and were negative apart from the ones mentioned in subjective review  Medications SCHEDULED  Scheduled Medications  Medication Dose Route Frequency Provider Last Rate Last Admin   atorvastatin   80 mg Oral Daily Shumet L Adnew, MD   80 mg at 01/08/24 1816   dapagliflozin propanediol  5 mg Oral Daily Shumet L Adnew, MD   5 mg at 01/08/24 1930   escitalopram  oxalate  10 mg Oral Daily Shumet L Adnew, MD   10 mg at 01/08/24 1816   gabapentin   600 mg Oral BID Shumet L Adnew, MD   600 mg at 01/08/24 2316   insulin  lispro  0-10 Units Subcutaneous AC  HS Shumet L Adnew, MD       metoprolol  tartrate  50 mg Oral BID Shumet L Adnew, MD   50 mg at 01/08/24 2317   multivitamin with folic acid  1 tablet Oral Daily Shumet L Adnew, MD   1 tablet at 01/08/24 1931   traZODone   150 mg Oral Bedtime Shumet L Adnew, MD   150 mg at 01/08/24 2313   AS NEEDED  cyclobenzaprine , 10 mg, TID PRN [facility] Subcutaneous Sliding Scale Insulin   Protocol, , PRN  And insulin  lispro, 3-10 Units, PRN  And glucose, 12-24 g, PRN  And dextrose  50% in water , 12.5-25 g, PRN  And glucagon, 1 mg, PRN HYDROmorphone , 0.5 mg, Q3H PRN hydrOXYzine , 25 mg, Q8H PRN oxyCODONE , 5 mg, Q4H PRN   INFUSIONS  Infusions  Medication Dose Last Rate    EXAM   PHYSICAL EXAM: VITALS INTAKE/OUTPUT  BP 137/65   Pulse 77   Temp 36.6 C (97.9 F) (Oral)   Resp 16   Ht 1.6 m (5' 2.99)   Wt 97.1 kg (214 lb)   SpO2 93%   Breastfeeding No   BMI 37.92 kg/m  ----- Temp:  [36.6 C (97.9 F)-37.2 C (99 F)] 36.6 C (97.9 F) Heart Rate:  [77-107] 77 Resp:  [12-16] 16 BP: (127-173)/(64-79) 137/65 SpO2:  [91 %-95 %] 93 % (02/08 0523)  Intake/Output Summary (Last 24 hours) at 01/09/2024 0806 Last data filed at 01/09/2024 0523 Gross per 24 hour  Intake 1456.25 ml  Output 2200 ml  Net -743.75 ml   Net IO Since Admission: -743.75 mL [01/09/24 0806]   Physical Exam  Constitutional:  Alert and awake and well-developed, well-nourished, and in no distress.  HENT:  Head: Normocephalic and atraumatic.  Eyes: Pupils are equal, round, and reactive to light. No scleral icterus.  Neck: Normal range of motion. No tracheal deviation present.  Cardiovascular: Normal rate, regular rhythm, normal heart sounds and intact distal pulses.   Pulmonary/Chest: Effort normal and breath sounds normal. No respiratory distress. No wheezes or Rhonchi.  Abdominal: Soft. Bowel sounds are normal. No distension. There is no tenderness.  Musculoskeletal:  Right knee with immobilizer in place.  Mild tenderness.  Limited range of motion. No edema.  Neurological: Alert and oriented to person, place, and time. No cranial nerve deficit.  Skin: Skin is warm and dry. No diaphoresis.   Psychiatric: Mood and affect normal.    Access/Catheter: Patient Lines/Drains/Airways Status     Active Lines, Drains & Airways     Name Placement date Placement time Site Days   Peripheral IV  01/08/24 Left Antecubital 01/08/24  0258  Antecubital  1   Urethral Catheter 2 way 14 01/08/24  1736  2 way  1        LABS/RADIOLOGY RESULTS   Imaging: CT knee right without contrast  Result Date: 01/08/2024 [id number] HISTORY: fx CT KNEE RIGHT WO CONTRAST COMPARISON: Tib-fib x-ray January 07, 2024 TECHNIQUE: CT right knee without contrast. Coronal and sagittal reformats were obtained. FINDINGS: Comminuted, depressed lateral tibial plateau fracture with involvement of the tibial spine. Subtle acute nondisplaced medial tibial plateau fracture. Visualized proximal fibula is intact. Visualized distal femur is intact. Right knee joint appears congruent with moderate lipohemarthrosis. Infiltrating edema along the soft tissues of the knee.   Acute comminuted, depressed lateral tibial plateau fracture with involvement of the tibial spine. Subtle acute nondepressed medial tibial plateau fracture. Moderate volume right knee lipohemarthrosis. Report Created in [employer]. Interpreted By: Brantley Grimball,  MD                                Electronically signed by: Allana Blanch, MD  01/08/2024 4:22 [id number] Report created in [employer].   X-ray hip right with pelvis 2-3 views  Result Date: 01/07/2024 [id number], [id number], [id number] XR TIBIA FIBULA RIGHT AP AND LATERAL, XR 2+V FEMUR RIGHT, XR HIP W PEL 2-3V RIGHT HISTORY: injury COMPARISON: Right knee x-ray same day. FINDINGS: Bones: Cortical irregularity along the lateral tibial plateau better appreciated on this study. Joints: Normal. Soft Tissues: Small knee joint effusion.   Cortical irregularity along the lateral tibial plateau better appreciated on this study, suspicious for nondepressed fracture. Interpreted By: Belvie Mullinix                                 Electronically signed by: Belvie Mullinix  01/07/2024 11:10 [id number] Report created in [employer].   X-ray femur right 2 views  Result Date:  01/07/2024 [id number], [id number], [id number] XR TIBIA FIBULA RIGHT AP AND LATERAL, XR 2+V FEMUR RIGHT, XR HIP W PEL 2-3V RIGHT HISTORY: injury COMPARISON: Right knee x-ray same day. FINDINGS: Bones: Cortical irregularity along the lateral tibial plateau better appreciated on this study. Joints: Normal. Soft Tissues: Small knee joint effusion.   Cortical irregularity along the lateral tibial plateau better appreciated on this study, suspicious for nondepressed fracture. Interpreted By: Belvie Mullinix                                 Electronically signed by: Belvie Mullinix  01/07/2024 11:10 [id number] Report created in [employer].   X-ray tibia fibula right AP and lateral  Result Date: 01/07/2024 [id number], [id number], [id number] XR TIBIA FIBULA RIGHT AP AND LATERAL, XR 2+V FEMUR RIGHT, XR HIP W PEL 2-3V RIGHT HISTORY: injury COMPARISON: Right knee x-ray same day. FINDINGS: Bones: Cortical irregularity along the lateral tibial plateau better appreciated on this study. Joints: Normal. Soft Tissues: Small knee joint effusion.   Cortical irregularity along the lateral tibial plateau better appreciated on this study, suspicious for nondepressed fracture. Interpreted By: Belvie Mullinix                                 Electronically signed by: Belvie Mullinix  01/07/2024 11:10 [id number] Report created in [employer].   X-ray knee right AP lateral and axial  Result Date: 01/07/2024 [id number] XR KNEE RIGHT AP LATERAL AND AXIAL HISTORY: injury COMPARISON: None. FINDINGS: Bones: No acute fracture. Joints: Mild tricompartmental joint space narrowing. Soft Tissues: Small knee joint effusion.   No acute bony injury. Mild degenerative changes of the knee, with small knee joint effusion. Interpreted By: Belvie Mullinix                                 Electronically signed by: Belvie Mullinix  01/07/2024 9:02 [id number] Report created in [employer].   No results found.  EKG: Results  for orders placed or performed during the hospital encounter of 01/07/24 (from the past 36 hours)  ECG 12 lead  Result Value Ref Range   Ventricular Rate 90 BPM   Atrial Rate 90 BPM   PR Interval 148 ms  QRS Duration 84 ms   QT Interval 368 ms   QTC Calculation 450 ms   P Axis 41 degrees   R Axis -10 degrees   T Wave Axis 27 degrees   Impression   Normal sinus rhythm Low voltage QRS Septal infarct , age undetermined Abnormal ECG   Labs:  Recent Results (from the past 48 hours)  Comprehensive metabolic panel   Collection Time: 01/08/24  3:07  Result Value Ref Range   Glucose 121.76 (H) 74 - 106 mg/dL   Sodium 862.90 862 - 854 mMOL/L   Potassium 4.3 3.5 - 5.1 mMOL/L   Chloride 99.75 98 - 107 mMOL/L   CO2 28 21 - 31 mMOL/L   Anion Gap 14 10 - 20 mMOL/L   BUN 29 (H) 7 - 18 mg/dL   Creatinine 8.49 (H) 9.47 - 1.25 mg/dL   Calcium  8.9 8.5 - 10.5 mg/dL   Protein 6.2 6.0 - 8.4 g/dL   Albumin  3.9 3.0 - 5.0 g/dL   A/G Ratio 1.7 >=8.8   Globulin 2.3 1.5 - 3.8 g/dL   Total Bilirubin 0.6 0.2 - 1.3 mg/dL   AST 39 5 - 49 U/L   Alkaline Phosphatase 61.79 38 - 126 U/L   ALT 33 3 - 35 U/L   eGFR (2021) 37 (L) >60 mL/min/1.73 m2  CBC w/auto diff   Collection Time: 01/08/24  3:07  Result Value Ref Range   WBC 10.4 4.5 - 11.0 K/uL   RBC 4.21 3.80 - 5.20 M/uL   Hemoglobin 12.3 11.7 - 16.1 g/dL   Hematocrit 61.6 64.9 - 47.0 %   MCV 91.0 81.0 - 102.0 fL   MCH 29.2 25.0 - 35.0 pg   MCHC 32.1 30.0 - 36.0 g/dL   RDW 86.6 88.4 - 85.3 %   MPV 8.3 No Established Reference Range fL   Neutrophils % 67.7 40.0 - 70.0 %   Lymphocytes % 23.0 15.0 - 45.0 %   Monocytes % 7.8 1.0 - 8.0 %   Eosinophils % 1.2 0.0 - 6.0 %   BASO % 0.3 0.0 - 2.0 %   MONO # 0.8 No Established Reference Range K/uL   EOS # 0.1 No Established Reference Range K/uL   BASO # 0.0 No Established Reference Range K/uL   Platelets 194 150 - 450 K/uL   nRBC 0 0 - 0 /100WBC   LYMPH # 2.4 No Established Reference Range K/uL    ABS NEUT # 7.05 No Established Reference Range K/uL  Protime-INR   Collection Time: 01/08/24  3:07  Result Value Ref Range   PT Patient 10.8 9.9 - 13.6 Sec   INR 0.96 0.88 - 1.20  APTT   Collection Time: 01/08/24  3:07  Result Value Ref Range   PTT Patient 28.5 26.2 - 37.2 Sec  Type and screen   Collection Time: 01/08/24  3:07  Result Value Ref Range   ABORh O Positive    Antibody Screen Negative   HIV-1 and HIV-2 antibodies   Collection Time: 01/08/24  3:07  Result Value Ref Range   S/CO-HIV 0.11    HIV AG/AB Nonreactive Nonreactive  Hepatitis C antibody   Collection Time: 01/08/24  3:07  Result Value Ref Range   Hepatitis C Antibodies Nonreactive Nonreactive  ECG 12 lead   Collection Time: 01/08/24  3:24  Result Value Ref Range   Ventricular Rate 90 BPM   Atrial Rate 90 BPM   PR Interval 148 ms   QRS  Duration 84 ms   QT Interval 368 ms   QTC Calculation 450 ms   P Axis 41 degrees   R Axis -10 degrees   T Wave Axis 27 degrees  GLUCOSE POCT   Collection Time: 01/08/24  4:49  Result Value Ref Range   Glucose POCT 130 (H) 65 - 110 mg/dL  GLUCOSE POCT   Collection Time: 01/08/24 11:03  Result Value Ref Range   Glucose POCT 116 (H) 65 - 110 mg/dL  GLUCOSE POCT   Collection Time: 01/08/24 17:42  Result Value Ref Range   Glucose POCT 108 65 - 110 mg/dL  GLUCOSE POCT   Collection Time: 01/08/24 20:59  Result Value Ref Range   Glucose POCT 163 (H) 65 - 110 mg/dL  CBC w/auto diff   Collection Time: 01/09/24  1:48  Result Value Ref Range   WBC 8.0 4.5 - 11.0 K/uL   RBC 4.18 3.80 - 5.20 M/uL   Hemoglobin 12.3 11.7 - 16.1 g/dL   Hematocrit 61.3 64.9 - 47.0 %   MCV 92.3 81.0 - 102.0 fL   MCH 29.4 25.0 - 35.0 pg   MCHC 31.9 30.0 - 36.0 g/dL   RDW 86.7 88.4 - 85.3 %   MPV 8.7 No Established Reference Range fL   Neutrophils % 70.4 (H) 40.0 - 70.0 %   Lymphocytes % 18.3 15.0 - 45.0 %   Monocytes % 8.1 (H) 1.0 - 8.0 %   Eosinophils % 2.9 0.0 - 6.0 %   BASO % 0.3 0.0  - 2.0 %   MONO # 0.7 No Established Reference Range K/uL   EOS # 0.2 No Established Reference Range K/uL   BASO # 0.0 No Established Reference Range K/uL   Platelets 189 150 - 450 K/uL   nRBC 0 0 - 0 /100WBC   LYMPH # 1.5 No Established Reference Range K/uL   ABS NEUT # 5.63 No Established Reference Range K/uL  Basic metabolic panel   Collection Time: 01/09/24  1:48  Result Value Ref Range   Creatinine 1.02 0.52 - 1.25 mg/dL   Glucose 852.20 (H) 74 - 106 mg/dL   Sodium 865.89 (L) 862 - 145 mMOL/L   Potassium 4.3 3.5 - 5.1 mMOL/L   Chloride 100.05 98 - 107 mMOL/L   CO2 28 21 - 31 mMOL/L   Anion Gap 10 10 - 20 mMOL/L   BUN 16 7 - 18 mg/dL   Calcium  8.9 8.5 - 10.5 mg/dL   eGFR (7978) 60 (L) >39 mL/min/1.73 m2   DISCLAIMER: This chart was created using M-Modal dictation software. Efforts were made by me to ensure accuracy, however some errors may be present due to limitations of this technology and occasionally words are not transcribed as intended.  Electronically Signed:    Ramiro LITTIE Clerk, MD 01/09/24 1244  Electronically signed by Ramiro LITTIE Clerk, MD at 01/09/2024 12:44 EST

## (undated) NOTE — Assessment & Plan Note (Signed)
 Formatting of this note is different from the original.   01/08/24 1600  Clinical Encounter Type  Reason for Visit Chaplain initiated  Visited With Patient and family together;Health care provider  Religious Encounters  Religious Needs/Interventions Spiritual/emotional support  Progress and Plan  Pastoral Care Plan Ongoing support  Basile Notes  (Chaplain offered emotional support and validated emotions around faith. Pt and pt's family told chaplain that they are coping well, but they appreciate visit. Pastoral Care may follow up.)    Electronically signed by Bertrum FORBES Neri at 01/08/2024 16:05 EST

## (undated) NOTE — Assessment & Plan Note (Signed)
 Formatting of this note might be different from the original.  Problem: Acute Pain Goal: Improve-Acute Pain Description: Pain that persists over time -- Improve Flowsheets (Taken 01/10/2024 1031) Acute pain outcomes: Stabilizing  Problem: Musculoskeletal Alteration Goal: Improve-Musculoskeletal Alteration Description: Change in or modification of the muscles, bones or support structures Flowsheets (Taken 01/10/2024 1031) Musculoskeletal Alteration Outcomes: Stabilizing  Problem: Infection Risk Goal: Stabilize- Infection Risk Description: Increased chance of contamination with disease-producing germs. Flowsheets (Taken 01/10/2024 1031) Infection Risk Outcomes: Improving  Electronically signed by Sallyann Horsfall, RN at 01/10/2024 10:31 EST

## (undated) NOTE — Assessment & Plan Note (Signed)
 Formatting of this note might be different from the original.  Problem: Acute Pain Goal: Improve-Acute Pain Description: Pain that persists over time -- Improve Flowsheets (Taken 01/11/2024 0002) Acute pain outcomes: Stabilizing  Problem: Musculoskeletal Alteration Goal: Improve-Musculoskeletal Alteration Description: Change in or modification of the muscles, bones or support structures Flowsheets (Taken 01/11/2024 0002) Musculoskeletal Alteration Outcomes: Stabilizing  Problem: Infection Risk Goal: Stabilize- Infection Risk Description: Increased chance of contamination with disease-producing germs. Flowsheets (Taken 01/11/2024 0002) Infection Risk Outcomes: Stabilizing  Electronically signed by Charmaine Daring, RN at 01/11/2024  0:02 EST

## (undated) NOTE — Nursing Note (Signed)
 Formatting of this note might be different from the original. -------------------------------------------------------------------------------- HCM SUPPORT SERVICES -------------------------------------------------------------------------------- HCM Support Services User Fields:  ---------------------------------------- Date Entered:   01/14/2024 Service Type:   DME Supplier Case Worker:    Celestia Liberty Harp Date:  12/01/2006  Agency:         [facility] [place] Referral Status:Booked  -------------------------------------------------------------------------------- HCM DISCHARGE PLANNING -------------------------------------------------------------------------------- HCM Discharge Planning Comments:  --- 01/11/2024 02:17 PM by Liberty Celestia --- P: Discharge Planning  A: Discuss PAC needs, CM noted home health .  I: CM sent  referral to [facility].  P: CM following for response, will continue to assist with DCP needs.  --- 01/11/2024 09:12 AM by Liberty Celestia --- P: Discharge Planning  A: Discuss PAC needs, CM noted PT recommendation for DME need RW.  I: CM sent  referral to DME provider [facility].  P: CM following for response, will continue to assist with DCP needs.  --- 01/10/2024 05:24 PM by Meade RodriguezUntiveros --- DME ordered with [facility] - pending delivery  --- 01/10/2024 04:55 PM by Meade RodriguezUntiveros --- P: Continued Stay review  A: Clinical problems/barriers to discharge: medically unstable  I: Patient endorses not feeling in full strength and requested  re evaluation  PT/OT. Placement vs. Home w/HH  P: CM following  --- 01/09/2024 10:44 AM by Liberty Celestia --- Cm introduced herself to patient and informed of her role in her DCP. PCP is  Dr. Annabella Rigg. Patient stated that she  resides at [address] with  her life partner.  Patient lives in a house with a two stair step up. Patient  does not have a living will or Power of [id number].  Patient denies DME usage  .Patient is insured with [employer] and DSNP.  Preferred RX is [facility] and mail-in.  Patient  denies  previous  home health services or community resources. Patient  family will transport upon D/C. CM will continue to follow for DCP assistance.  -------------------------------------------------------------------------------- HCM DISCHARGE PLANNING EVALUATION -------------------------------------------------------------------------------- Case Workers:   Investment banker, operational Living Status:  Spouse-partner Setting:        Home-residence  ADL Limits:     Mobility or ambulation  DME:            None  HCM Adult nurse Fields:  Advertising copywriter Prior to Admission:  None or NA  Current Mental Status/Cognition:  Alert and Oriented  Information obtained from:  Patient  Discharge Barriers, select all that apply:  None or NA  Readmission (unplanned) in the last 30 days:  No  Patient goals and preferences after discharge:  home  Based on information gathered, is it likely that the patient's care needs can  be met in the environment from which he/she entered the hospital?:  Yes  Proposed Discharge Plan, select one:  Home  Home Services needed:  None or NA  If a caregiver is needed, is there a caregiver available, willing and capable  to provide care?:  Yes  Community services needed:  None  New DME required at discharge:  None or NA  If new DME is required, is patient able to obtain DME?:  Yes  Home Modifications required:  None or NA  If home modifications are required, is patient able to obtain them?:  Yes  Patient concerns about obtaining medications on day of discharge?:  None  Transportation needs at discharge:  none  Discharge Plan Discussed with:  Patient  Have you discussed with the patient how his/her care needs may  change over  time?:  Yes  Patient/representative agrees with discharge plan:  Yes  Discussed expected insurance  coverage and/or out of pocket expenses:  Yes  Date / Time:  01/09/2024 10:40 AM  Evaluated by:  Celestia Jaksch  -------------------------------------------------------------------------------- Discharge Planning Additional Information --------------------------------------------------------------------------------  ================================================================================ Updated by:     REBECKA FRESH (HISINTFC) - 01/14/2024 8:38 AM ================================================================================ Electronically signed by Jaksch Celestia, LCSW at 01/09/2024 10:44 EST Electronically signed by Meade Evern Friday, LCSW at 01/10/2024 16:55 EST Electronically signed by Meade Evern Friday, LCSW at 01/10/2024 17:24 EST Electronically signed by Jaksch Celestia, LCSW at 01/11/2024  9:12 EST Electronically signed by Jaksch Celestia, LCSW at 01/11/2024 14:17 EST

## (undated) NOTE — H&P (Signed)
 Formatting of this note is different from the original. Images from the original note were not included.  HOSPITAL MEDICINE  ADMISSION HISTORY AND PHYSICAL  Patient Identifiers:  Tiffany Robles 9 y.o.female DOB: 09/04/1956  MRN: [id number] ED Bed Date/Time:  01/07/2024 19:53  Attending Physician:  Darlen Poster, NP    Patient Care Team: Freddrick Fang, MD as PCP - General  ASSESSMENT/PLAN  Tiffany Robles is a 60 y.o. year old female chief complaint of right knee pain PMHx HTN, HLD, IDDM, CKD stage 3, neuropathy.   This case was directly dicussed with the EM provider and staff; work-up reviewed together and independently. I have reviewed prior EMR inpatient/outpatient documentation as unique sources of history and information. Patient will be Admitted to the hospital for greater than 2 midnights as Inpatient status.  Closed fracture of right tibial plateau  CT knee right noted acute comminuted, depressed lateral tibial plateau fracture with involvement to the tibial spine; subtle acute nondisplaced medial tibial plateau fracture. Keep NPO Patient evaluated by orthopedic surgery Maintain right knee immobilizer elevate affected extremity Analgesics as needed Neurovascular checks  Insulin -dependent type 2 diabetes mellitus Serum glucose 121 mg/dL Glycemic management with sliding scale insulin   Essential hypertension Monitor BP Review and resume home antihypertensives as appropriate  Hyperlipidemia Resume home statins as appropriate  Chronic kidney disease stage III Creatinine 1.50 (1.36 a year ago) Avoid nephrotoxins Monitor renal indices Monitor urine output  I have reviewed the relevant elements of the check-list below with the team and addressed the issues pertinent DVT Prophylaxis:  SCD (left) Class:  Inpatient Code Status: Full Code Medication Reconciliation Foley Catheter: N/A  Isolation: N/A No diet orders on file Bowel regimen addressed   HPI and ROS    Chief Complaint  Patient presents with   Knee Pain    Pt c/o R knee pain after slipping getting out of shower. Unable to bear weight. Denies hitting head/ blood thinners.    History of Present Illness:  Tiffany Robles is a 82 y.o. year old female with PMHx significant for HTN, HLD, IDDM, CKD stage 3, neuropathy who presented to emergency department with complaint of right knee pain after fall.  Patient reports that she was getting out of the tub when she slipped and fell on to her knee and heard a snap.  She notes that since then she has pain and been unable to weight-bear.  Patient denies hitting her head or syncope.  She denies fever, cough, chest pain, palpitations,, vomiting, abdominal pain or changes in bladder and bowel habits.  Vital signs on arrival in ED temperature 98.6 F, HR 86, R 18, BP 156/68, SpO2 100% on room air.  Labs notable for BUN 29, creatinine 1.50, glucose 121, otherwise unremarkable.  XR knee right noted no acute bony injury; mild degenerative changes of the knee with small knee joint effusion.  XR hip right and pelvis, XR tib fib right, XR femur right noted cortical irregularity along the lateral tibia plateau, suspicious for nondepressed fracture. CT knee right noted acute comminuted, depressed lateral tibial plateau fracture with involvement to the tibial spine; subtle acute nondisplaced medial tibial plateau fracture. Patient received hydromorphone  1 mg IV he in ED.  Review of Systems  Constitutional:  Negative for chills and fever.  HENT:  Negative for congestion, sinus pain and sore throat.   Eyes:  Negative for blurred vision and photophobia.  Respiratory:  Negative for cough, shortness of breath and wheezing.   Cardiovascular:  Negative for chest pain, palpitations  and leg swelling.  Gastrointestinal:  Negative for abdominal pain, diarrhea, nausea and vomiting.  Genitourinary:  Negative for dysuria and frequency.  Musculoskeletal:  Positive for joint pain.   Skin:  Negative for rash.  Neurological:  Negative for dizziness, tingling, sensory change and headaches.  Endo/Heme/Allergies:  Does not bruise/bleed easily.  Psychiatric/Behavioral:  Negative for hallucinations and suicidal ideas.    PAST MEDICAL, SURGICAL, SOCIAL, AND FAMILY HISTORY   Prior Medical History Prior Surgical History  Past Medical History:   Anxiety   Chronic kidney disease (CKD), stage III (moderate) (CMS-HCC)   Diabetes mellitus (CMS, HHS-HCC)   Hyperlipidemia   Hypertension   Neuropathy   Past Surgical History:  Procedure Laterality Date   BACK SURGERY     HYSTERECTOMY     Prior Family History Prior Social History  History reviewed. No pertinent family history.  Social History   Socioeconomic History   Marital status: Single  Tobacco Use   Smoking status: Former    Types: Cigarettes   Smokeless tobacco: Never  Vaping Use   Vaping status: Never Used  Substance and Sexual Activity   Alcohol use: Never   Drug use: Never    MEDICATIONS HOME MEDICATIONS  Prior to Admission medications   Not on File   Home Medications verified and updated by Pharm Tech: []  Yes, completed  []  No, to be verified and completed   ALLERGIES  Not on File   EXAM   PHYSICAL EXAM: VITALS INTAKE/OUTPUT  Vitals:   01/07/24 2344  BP: 144/76  Pulse: 92  Resp: 16  Temp: 36.6 C (97.9 F)  SpO2: 92%   SpO2:  [92 %-100 %] 92 % (02/06 2344) No intake or output data in the 24 hours ending 01/08/24 0434   Physical Exam Constitutional:      General: She is not in acute distress.    Appearance: Normal appearance.  HENT:     Head: Normocephalic and atraumatic.     Nose: Nose normal.     Mouth/Throat:     Mouth: Mucous membranes are dry.  Eyes:     Extraocular Movements: Extraocular movements intact.     Conjunctiva/sclera: Conjunctivae normal.     Pupils: Pupils are equal, round, and reactive to light.  Cardiovascular:     Rate and Rhythm: Normal rate and regular  rhythm.     Pulses: Normal pulses.     Heart sounds: Normal heart sounds.  Pulmonary:     Effort: Pulmonary effort is normal. No respiratory distress.     Breath sounds: Normal breath sounds. No wheezing.  Abdominal:     General: Bowel sounds are normal.     Palpations: Abdomen is soft.     Tenderness: There is no abdominal tenderness.  Musculoskeletal:        General: Tenderness (right knee) present.     Cervical back: Neck supple. No tenderness.     Comments: Right knee with immobilizer in place  Skin:    General: Skin is warm and dry.     Capillary Refill: Capillary refill takes less than 2 seconds.  Neurological:     General: No focal deficit present.     Mental Status: She is alert and oriented to person, place, and time.     Sensory: No sensory deficit.  Psychiatric:        Mood and Affect: Mood normal.        Behavior: Behavior normal.        Thought Content: Thought  content normal.        Judgment: Judgment normal.   Access/Catheter: Patient Lines/Drains/Airways Status     Active Lines, Drains & Airways     Name Placement date Placement time Site Days   Peripheral IV 01/08/24 Left Antecubital 01/08/24  0258  Antecubital  less than 1        LABS/RADIOLOGY RESULTS   Imaging: CT knee right without contrast  Result Date: 01/08/2024 [id number] HISTORY: fx CT KNEE RIGHT WO CONTRAST COMPARISON: Tib-fib x-ray January 07, 2024 TECHNIQUE: CT right knee without contrast. Coronal and sagittal reformats were obtained. FINDINGS: Comminuted, depressed lateral tibial plateau fracture with involvement of the tibial spine. Subtle acute nondisplaced medial tibial plateau fracture. Visualized proximal fibula is intact. Visualized distal femur is intact. Right knee joint appears congruent with moderate lipohemarthrosis. Infiltrating edema along the soft tissues of the knee.   Acute comminuted, depressed lateral tibial plateau fracture with involvement of the tibial spine. Subtle  acute nondepressed medial tibial plateau fracture. Moderate volume right knee lipohemarthrosis. Report Created in [facility] [employer]. Interpreted By: Natha Forte, MD                                Electronically signed by: Allana Blanch, MD  01/08/2024 4:22 [id number] Report created in [facility] [employer].   X-ray hip right with pelvis 2-3 views  Result Date: 01/07/2024 [id number], [id number], [id number] XR TIBIA FIBULA RIGHT AP AND LATERAL, XR 2+V FEMUR RIGHT, XR HIP W PEL 2-3V RIGHT HISTORY: injury COMPARISON: Right knee x-ray same day. FINDINGS: Bones: Cortical irregularity along the lateral tibial plateau better appreciated on this study. Joints: Normal. Soft Tissues: Small knee joint effusion.   Cortical irregularity along the lateral tibial plateau better appreciated on this study, suspicious for nondepressed fracture. Interpreted By: Belvie Mullinix                                 Electronically signed by: Belvie Mullinix  01/07/2024 11:10 [id number] Report created in [facility] [employer].   X-ray femur right 2 views  Result Date: 01/07/2024 [id number], [id number], [id number] XR TIBIA FIBULA RIGHT AP AND LATERAL, XR 2+V FEMUR RIGHT, XR HIP W PEL 2-3V RIGHT HISTORY: injury COMPARISON: Right knee x-ray same day. FINDINGS: Bones: Cortical irregularity along the lateral tibial plateau better appreciated on this study. Joints: Normal. Soft Tissues: Small knee joint effusion.   Cortical irregularity along the lateral tibial plateau better appreciated on this study, suspicious for nondepressed fracture. Interpreted By: Belvie Mullinix                                 Electronically signed by: Belvie Mullinix  01/07/2024 11:10 [id number] Report created in [facility] [employer].   X-ray tibia fibula right AP and lateral  Result Date: 01/07/2024 [id number], [id number], [id number] XR TIBIA FIBULA RIGHT AP AND LATERAL, XR 2+V FEMUR RIGHT, XR HIP W PEL 2-3V RIGHT HISTORY: injury COMPARISON: Right knee  x-ray same day. FINDINGS: Bones: Cortical irregularity along the lateral tibial plateau better appreciated on this study. Joints: Normal. Soft Tissues: Small knee joint effusion.   Cortical irregularity along the lateral tibial plateau better appreciated on this study, suspicious for nondepressed fracture. Interpreted By: Belvie Mullinix  Electronically signed by: Belvie Mullinix  01/07/2024 11:10 [id number] Report created in [facility] [employer].   X-ray knee right AP lateral and axial  Result Date: 01/07/2024 [id number] XR KNEE RIGHT AP LATERAL AND AXIAL HISTORY: injury COMPARISON: None. FINDINGS: Bones: No acute fracture. Joints: Mild tricompartmental joint space narrowing. Soft Tissues: Small knee joint effusion.   No acute bony injury. Mild degenerative changes of the knee, with small knee joint effusion. Interpreted By: Belvie Mullinix                                 Electronically signed by: Belvie Mullinix  01/07/2024 9:02 [id number] Report created in [facility] [employer].   CT knee right without contrast  Result Date: 01/08/2024 Acute comminuted, depressed lateral tibial plateau fracture with involvement of the tibial spine. Subtle acute nondepressed medial tibial plateau fracture. Moderate volume right knee lipohemarthrosis. Report Created in [facility] [employer]. Interpreted By: Natha Forte, MD                                Electronically signed by: Allana Blanch, MD  01/08/2024 4:22 [id number] Report created in [facility] [employer].   X-ray hip right with pelvis 2-3 views  Result Date: 01/07/2024 Cortical irregularity along the lateral tibial plateau better appreciated on this study, suspicious for nondepressed fracture. Interpreted By: Belvie Mullinix                                 Electronically signed by: Belvie Mullinix  01/07/2024 11:10 [id number] Report created in [facility] [employer].   X-ray femur right 2 views  Result Date: 01/07/2024 Cortical  irregularity along the lateral tibial plateau better appreciated on this study, suspicious for nondepressed fracture. Interpreted By: Belvie Mullinix                                 Electronically signed by: Belvie Mullinix  01/07/2024 11:10 [id number] Report created in [facility] [employer].   X-ray tibia fibula right AP and lateral  Result Date: 01/07/2024 Cortical irregularity along the lateral tibial plateau better appreciated on this study, suspicious for nondepressed fracture. Interpreted By: Belvie Mullinix                                 Electronically signed by: Belvie Mullinix  01/07/2024 11:10 [id number] Report created in [facility] [employer].   X-ray knee right AP lateral and axial  Result Date: 01/07/2024 No acute bony injury. Mild degenerative changes of the knee, with small knee joint effusion. Interpreted By: Belvie Mullinix                                 Electronically signed by: Belvie Mullinix  01/07/2024 9:02 [id number] Report created in [facility] [employer].    EKG: Results for orders placed or performed during the hospital encounter of 01/07/24 (from the past 36 hours)  ECG 12 lead  Result Value Ref Range   Ventricular Rate 90 BPM   Atrial Rate 90 BPM   PR Interval 148 ms   QRS Duration 84 ms   QT Interval 368 ms   QTC Calculation 450 ms   P Axis 41  degrees   R Axis -10 degrees   T Wave Axis 27 degrees   Impression   Normal sinus rhythm Low voltage QRS Septal infarct , age undetermined Abnormal ECG   Labs:  Recent Results (from the past 48 hours)  Comprehensive metabolic panel   Collection Time: 01/08/24  3:07  Result Value Ref Range   Glucose 121.76 (H) 74 - 106 mg/dL   Sodium 862.90 862 - 854 mMOL/L   Potassium 4.3 3.5 - 5.1 mMOL/L   Chloride 99.75 98 - 107 mMOL/L   CO2 28 21 - 31 mMOL/L   Anion Gap 14 10 - 20 mMOL/L   BUN 29 (H) 7 - 18 mg/dL   Creatinine 8.49 (H) 9.47 - 1.25 mg/dL   Calcium  8.9 8.5 - 10.5 mg/dL   Protein 6.2 6.0 - 8.4 g/dL   Albumin  3.9  3.0 - 5.0 g/dL   A/G Ratio 1.7 >=8.8   Globulin 2.3 1.5 - 3.8 g/dL   Total Bilirubin 0.6 0.2 - 1.3 mg/dL   AST 39 5 - 49 U/L   Alkaline Phosphatase 61.79 38 - 126 U/L   ALT 33 3 - 35 U/L   eGFR (2021) 37 (L) >60 mL/min/1.73 m2  CBC w/auto diff   Collection Time: 01/08/24  3:07  Result Value Ref Range   WBC 10.4 4.5 - 11.0 K/uL   RBC 4.21 3.80 - 5.20 M/uL   Hemoglobin 12.3 11.7 - 16.1 g/dL   Hematocrit 61.6 64.9 - 47.0 %   MCV 91.0 81.0 - 102.0 fL   MCH 29.2 25.0 - 35.0 pg   MCHC 32.1 30.0 - 36.0 g/dL   RDW 86.6 88.4 - 85.3 %   MPV 8.3 No Established Reference Range fL   Neutrophils % 67.7 40.0 - 70.0 %   Lymphocytes % 23.0 15.0 - 45.0 %   Monocytes % 7.8 1.0 - 8.0 %   Eosinophils % 1.2 0.0 - 6.0 %   BASO % 0.3 0.0 - 2.0 %   MONO # 0.8 No Established Reference Range K/uL   EOS # 0.1 No Established Reference Range K/uL   BASO # 0.0 No Established Reference Range K/uL   Platelets 194 150 - 450 K/uL   nRBC 0 0 - 0 /100WBC   LYMPH # 2.4 No Established Reference Range K/uL   ABS NEUT # 7.05 No Established Reference Range K/uL  Protime-INR   Collection Time: 01/08/24  3:07  Result Value Ref Range   PT Patient 10.8 9.9 - 13.6 Sec   INR 0.96 0.88 - 1.20  APTT   Collection Time: 01/08/24  3:07  Result Value Ref Range   PTT Patient 28.5 26.2 - 37.2 Sec  Type and screen   Collection Time: 01/08/24  3:07  Result Value Ref Range   ABORh O Positive    Antibody Screen Negative   HIV-1 and HIV-2 antibodies   Collection Time: 01/08/24  3:07  Result Value Ref Range   S/CO-HIV 0.11    HIV AG/AB Nonreactive Nonreactive  Hepatitis C antibody   Collection Time: 01/08/24  3:07  Result Value Ref Range   Hepatitis C Antibodies Nonreactive Nonreactive  ECG 12 lead   Collection Time: 01/08/24  3:24  Result Value Ref Range   Ventricular Rate 90 BPM   Atrial Rate 90 BPM   PR Interval 148 ms   QRS Duration 84 ms   QT Interval 368 ms   QTC Calculation 450 ms   P Axis 41 degrees  R  Axis -10 degrees   T Wave Axis 27 degrees   DISCLAIMER: This chart was created using M-Modal dictation software. Efforts were made by me to ensure accuracy, however some errors may be present due to limitations of this technology and occasionally words are not transcribed as intended.  Electronically Signed:    Darlen Poster, NP 01/08/24 0450   Darlen Poster, NP 01/08/24 9546   Greig LITTIE Sailors, MD 01/08/24 0543  Cosigned by Greig LITTIE Sailors, MD at 01/08/2024  5:43 EST Electronically signed by Darlen Poster, NP at 01/08/2024  4:50 EST Electronically signed by Darlen Poster, NP at 01/08/2024  4:53 EST Electronically signed by Greig LITTIE Sailors, MD at 01/08/2024  5:43 EST  Associated attestation - Sailors Greig LITTIE, MD - 01/08/2024 0543 EST Formatting of this note might be different from the original. Images from the original note were not included.  HOSPITAL MEDICINE ATTENDING PHYSICIAN ATTESTATION  The patient was independently seen and examined on 01/08/2024 by me and by the advanced practice provider Darlen Poster, NP We both independently obtained a history.  We both independently performed a physical exam.  I have fully discussed the patient's case in detail and the assessment and plan of care has been outlined above by the Advanced Practice Provider. Medical decision making was performed entirely by me as outlined below.    76 y/o female with mechanical fall in shower resulting in closed R tibial plateau fx. Improved pain control. Otherwise in baseline state of health. No CP, SOB.  Closed right tibial plateau fracture  - Admit inpt - Ortho consult - LLE mech DVT prophy only - lowest effective pain control with bowel regimen - Benefit > Risk to proceed to OR. Intermediate risk due to age and ortho procedure. Otherwise optimized  Cr 1.5 - no baseline CKD vs AKI   Electronically signed:

---
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
## 2018-01-12 DIAGNOSIS — F3341 Major depressive disorder, recurrent, in partial remission: Secondary | ICD-10-CM | POA: Insufficient documentation

## 2018-01-15 DIAGNOSIS — Z1211 Encounter for screening for malignant neoplasm of colon: Secondary | ICD-10-CM | POA: Insufficient documentation

## 2018-02-15 DIAGNOSIS — F418 Other specified anxiety disorders: Secondary | ICD-10-CM | POA: Insufficient documentation

## 2018-02-15 DIAGNOSIS — B379 Candidiasis, unspecified: Secondary | ICD-10-CM | POA: Insufficient documentation

## 2018-03-10 DIAGNOSIS — K635 Polyp of colon: Secondary | ICD-10-CM | POA: Insufficient documentation

## 2018-04-20 DIAGNOSIS — G4700 Insomnia, unspecified: Secondary | ICD-10-CM | POA: Insufficient documentation

## 2018-04-20 DIAGNOSIS — R0683 Snoring: Secondary | ICD-10-CM | POA: Insufficient documentation

## 2018-04-23 DIAGNOSIS — R269 Unspecified abnormalities of gait and mobility: Secondary | ICD-10-CM | POA: Insufficient documentation

## 2018-04-23 DIAGNOSIS — G8929 Other chronic pain: Secondary | ICD-10-CM | POA: Insufficient documentation

## 2018-11-10 ENCOUNTER — Other Ambulatory Visit (HOSPITAL_COMMUNITY): Payer: Self-pay | Admitting: *Deleted

## 2018-11-10 DIAGNOSIS — Z1231 Encounter for screening mammogram for malignant neoplasm of breast: Secondary | ICD-10-CM

## 2018-12-15 ENCOUNTER — Other Ambulatory Visit: Payer: Self-pay | Admitting: Family Medicine

## 2018-12-15 ENCOUNTER — Ambulatory Visit
Admission: RE | Admit: 2018-12-15 | Discharge: 2018-12-15 | Disposition: A | Discharge status: Home / Self Care | Payer: PRIVATE HEALTH INSURANCE | Source: Ambulatory Visit | Attending: Family Medicine | Admitting: Family Medicine

## 2018-12-15 DIAGNOSIS — M5442 Lumbago with sciatica, left side: Secondary | ICD-10-CM

## 2019-01-28 ENCOUNTER — Other Ambulatory Visit: Payer: Self-pay | Admitting: Family Medicine

## 2019-01-28 ENCOUNTER — Ambulatory Visit
Admission: RE | Admit: 2019-01-28 | Discharge: 2019-01-28 | Disposition: A | Discharge status: Home / Self Care | Payer: PRIVATE HEALTH INSURANCE | Source: Ambulatory Visit | Attending: Family Medicine | Admitting: Family Medicine

## 2019-01-28 DIAGNOSIS — M25562 Pain in left knee: Secondary | ICD-10-CM

## 2019-02-01 ENCOUNTER — Ambulatory Visit (HOSPITAL_COMMUNITY)
Admission: RE | Admit: 2019-02-01 | Discharge: 2019-02-01 | Disposition: A | Discharge status: Home / Self Care | Payer: PRIVATE HEALTH INSURANCE | Source: Ambulatory Visit | Attending: Obstetrics and Gynecology | Admitting: Obstetrics and Gynecology

## 2019-02-01 ENCOUNTER — Ambulatory Visit
Admission: RE | Admit: 2019-02-01 | Discharge: 2019-02-01 | Disposition: A | Discharge status: Home / Self Care | Payer: Self-pay | Source: Ambulatory Visit | Attending: Obstetrics and Gynecology | Admitting: Obstetrics and Gynecology

## 2019-02-01 ENCOUNTER — Encounter (HOSPITAL_COMMUNITY): Payer: Self-pay

## 2019-02-01 VITALS — BP 142/78 | Wt 182.0 lb

## 2019-02-01 DIAGNOSIS — Z1231 Encounter for screening mammogram for malignant neoplasm of breast: Secondary | ICD-10-CM

## 2019-02-01 DIAGNOSIS — Z1239 Encounter for other screening for malignant neoplasm of breast: Secondary | ICD-10-CM

## 2019-02-01 HISTORY — DX: Type 2 diabetes mellitus without complications: E11.9

## 2019-02-01 HISTORY — DX: Unspecified osteoarthritis, unspecified site: M19.90

## 2019-02-01 HISTORY — DX: Essential (primary) hypertension: I10

## 2019-02-01 NOTE — Progress Notes (Signed)
No complaints today.   Pap Smear: Pap smear not completed today. Last Pap smear was 20 years ago and normal per patient. Per patient has a history of an abnormal Pap smear prior to her hysterectomy in 1987. Patient has a history of a hysterectomy in 1987 due to cervical cancer per patient. No further Pap smears recommended by BCCCP and ACOG due to abnormal Pap smear and hysterectomy was 33 years ago. No Pap smear results are in Epic.  Physical exam: Breasts Breasts symmetrical. No skin abnormalities bilateral breasts. No nipple retraction bilateral breasts. No nipple discharge bilateral breasts. No lymphadenopathy. No lumps palpated bilateral breasts. No complaints of pain or tenderness on exam. Referred patient to the [facility] for a screening mammogram. Appointment scheduled for Tuesday, February 01, 2019 at 1210.        Pelvic/Bimanual No Pap smear completed today since patient has a history of a hysterectomy 33 years ago. Pap smear not indicated per BCCCP guidelines.   Smoking History: Patient is a former smoker that quit 21 years ago.   Patient Navigation: Patient education provided. Access to services provided for patient through [facility] program.   Colorectal Cancer Screening: Per patient had a colonoscopy completed in April 2019. No complaints today.   Breast and Cervical Cancer Risk Assessment: Patient has no family history of breast cancer, known genetic mutations, or radiation treatment to the chest before age 84. Per patient has a history of cervical dysplasia. Patient has no history of being immunocompromised or DES exposure in-utero.  Risk Assessment    Risk Scores      02/01/2019   Last edited by: Armond Hang, LPN   5-year risk: 1.2 %   Lifetime risk: 4.7 %

## 2019-02-01 NOTE — Patient Instructions (Signed)
Explained breast self awareness with [facility]. Patient did not need a Pap smear today due to patient has a history of a hysterectomy 33 years ago. Let patient know that she doesn't need any further Pap smears due to history of a hysterectomy 33 years ago.  Referred patient to the [facility] for a screening mammogram. Appointment scheduled for Tuesday, February 01, 2019 at 1210. Patient aware of appointment and will be there. Let patient know the [facility] will follow up with her within the next couple weeks with results of mammogram by letter or phone. Dorien Ohagan verbalized understanding.  Elyan Vanwieren, Arvil Chaco, RN 12:16 PM

## 2019-03-07 ENCOUNTER — Encounter (HOSPITAL_COMMUNITY): Payer: Self-pay | Admitting: *Deleted

## 2019-06-13 ENCOUNTER — Other Ambulatory Visit: Payer: Self-pay | Admitting: Family Medicine

## 2019-06-13 DIAGNOSIS — M545 Low back pain, unspecified: Secondary | ICD-10-CM

## 2019-06-28 ENCOUNTER — Other Ambulatory Visit: Payer: Self-pay

## 2019-06-28 ENCOUNTER — Ambulatory Visit
Admission: RE | Admit: 2019-06-28 | Discharge: 2019-06-28 | Disposition: A | Discharge status: Home / Self Care | Payer: Medicare HMO | Source: Ambulatory Visit | Attending: Family Medicine | Admitting: Family Medicine

## 2019-06-28 DIAGNOSIS — M545 Low back pain, unspecified: Secondary | ICD-10-CM

## 2019-07-20 ENCOUNTER — Encounter (HOSPITAL_COMMUNITY): Payer: Self-pay | Admitting: Physician Assistant

## 2019-07-20 DIAGNOSIS — I10 Essential (primary) hypertension: Secondary | ICD-10-CM | POA: Diagnosis present

## 2019-07-20 DIAGNOSIS — F419 Anxiety disorder, unspecified: Secondary | ICD-10-CM | POA: Diagnosis present

## 2019-07-20 DIAGNOSIS — M1712 Unilateral primary osteoarthritis, left knee: Secondary | ICD-10-CM | POA: Diagnosis present

## 2019-07-20 DIAGNOSIS — E119 Type 2 diabetes mellitus without complications: Secondary | ICD-10-CM

## 2019-07-20 NOTE — H&P (Addendum)
TOTAL KNEE ADMISSION H&P  Patient is being admitted for left total knee arthroplasty.  Subjective:  Chief Complaint:left knee pain.  HPI: Tiffany Robles, 63 y.o. female, has a history of pain and functional disability in the left knee due to arthritis and has failed non-surgical conservative treatments for greater than 12 weeks to includeNSAID's and/or analgesics, corticosteriod injections, viscosupplementation injections, flexibility and strengthening excercises, supervised PT with diminished ADL's post treatment, weight reduction as appropriate and activity modification.  Onset of symptoms was gradual, starting 10 years ago with gradually worsening course since that time. The patient noted no past surgery on the left knee(s).  Patient currently rates pain in the left knee(s) at 10 out of 10 with activity. Patient has night pain, worsening of pain with activity and weight bearing, pain that interferes with activities of daily living, crepitus and joint swelling.  Patient has evidence of subchondral sclerosis, periarticular osteophytes and joint space narrowing by imaging studies. There is no active infection.  Patient Active Problem List   Diagnosis Date Noted  . Primary localized osteoarthritis of left knee   . Hypertension   . Diabetes mellitus without complication ([place])   . Anxiety    Past Medical History:  Diagnosis Date  . Anxiety   . Arthritis   . Diabetes mellitus without complication ([place])   . Hypertension   . Primary localized osteoarthritis of left knee     Past Surgical History:  Procedure Laterality Date  . ABDOMINAL HYSTERECTOMY      No current facility-administered medications for this encounter.    Current Outpatient Medications  Medication Sig Dispense Refill Last Dose  . atorvastatin (LIPITOR) 40 MG tablet Take 40 mg by mouth at bedtime.   07/20/2019 at Unknown time  . Calcium Carb-Cholecalciferol (CALCIUM 600 + D PO) Take 1 tablet by mouth 2 (two) times daily.    07/20/2019 at Unknown time  . diazepam (VALIUM) 5 MG tablet Take 5 mg by mouth daily as needed (anxiety related to procedures).    Past Month at Unknown time  . Dulaglutide (TRULICITY) 1.5 YI/9.4WN SOPN Inject 1.5 mg into the skin every Friday.    Past Week at Unknown time  . glipiZIDE (GLUCOTROL) 5 MG tablet Take 5 mg by mouth 2 (two) times daily.   07/20/2019 at Unknown time  . ibuprofen (ADVIL,MOTRIN) 800 MG tablet Take 800 mg by mouth 2 (two) times daily as needed for moderate pain.    Past Month at Unknown time  . lisinopril-hydrochlorothiazide (ZESTORETIC) 20-25 MG tablet Take 1 tablet by mouth daily.   07/20/2019 at Unknown time  . metFORMIN (GLUCOPHAGE) 500 MG tablet Take 1,000 mg by mouth 2 (two) times daily.   07/20/2019 at Unknown time  . Multiple Vitamin (MULTIVITAMIN WITH MINERALS) TABS tablet Take 1 tablet by mouth 2 (two) times daily.   07/20/2019 at Unknown time  . PARoxetine (PAXIL) 30 MG tablet Take 60 mg by mouth daily.   07/20/2019 at Unknown time  . pregabalin (LYRICA) 75 MG capsule Take 75 mg by mouth 2 (two) times daily.   07/20/2019 at Unknown time  . tiZANidine (ZANAFLEX) 4 MG tablet Take 4 mg by mouth 2 (two) times daily as needed for muscle spasms.   07/20/2019 at Unknown time  . traZODone (DESYREL) 100 MG tablet Take 100 mg by mouth at bedtime.   07/19/2019 at Unknown time  . diphenhydrAMINE (BENADRYL) 25 MG tablet Take 25 mg by mouth daily as needed for allergies.   More than a  month at Unknown time   Allergies  Allergen Reactions  . Augmentin [Amoxicillin-Pot Clavulanate] Nausea And Vomiting  . Codeine Nausea And Vomiting  . Ivp Dye [Iodinated Diagnostic Agents] Hives and Itching  . Penicillins Swelling    Facial swelling Did it involve swelling of the face/tongue/throat, SOB, or low BP? Yes Did it involve sudden or severe rash/hives, skin peeling, or any reaction on the inside of your mouth or nose? No Did you need to seek medical attention at a hospital or doctor's  office? Yes When did it last happen?10+ years If all above answers are "NO", may proceed with cephalosporin use.     Social History   Tobacco Use  . Smoking status: Former Research scientist (life sciences)  . Smokeless tobacco: Never Used  Substance Use Topics  . Alcohol use: Yes    Comment: occassionally    Family History  Problem Relation Age of Onset  . Diabetes Mother   . Hypertension Mother   . Diabetes Father   . Hypertension Father   . Diabetes Brother   . Hypertension Brother   . Breast cancer Neg Hx      Review of Systems  Constitutional: Negative.   HENT: Negative.   Eyes: Negative.   Respiratory: Negative.   Cardiovascular: Negative.   Gastrointestinal: Negative.   Genitourinary: Negative.   Musculoskeletal: Positive for back pain and joint pain.  Skin: Negative.   Neurological: Negative.   Endo/Heme/Allergies: Negative.   Psychiatric/Behavioral: Negative.     Objective:  Physical Exam  Constitutional: She is oriented to person, place, and time. She appears well-developed and well-nourished.  HENT:  Head: Normocephalic and atraumatic.  Mouth/Throat: Oropharynx is clear and moist.  Eyes: Pupils are equal, round, and reactive to light. Conjunctivae are normal.  Neck: Neck supple.  Cardiovascular: Normal rate.  Respiratory: Effort normal.  GI: Soft.  Genitourinary:    Genitourinary Comments: Not pertinent to current symptomatology therefore not examined.   Musculoskeletal:     Comments: Examination of her left knee reveals pain medially and laterally.  1+ crepitation.  1+ synovitis.  Range of motion 0-120 degrees.  Knee is stable with moderate varus deformity.  Normal patella tracking.  Examination of her right knee reveals full range of motion without pain, swelling, weakness or instability.  Lumbar exam reveals left paralumbar pain radiating into the left leg and down into the left lower leg.  Range of motion of the lumbar spine decreased by 20%.  Straight leg raising  positive on the left at 70 degrees, negative on the right.  Neurologic exam: Distal motor and sensory examination is within normal limits.    Neurological: She is alert and oriented to person, place, and time.  Skin: Skin is warm and dry.  Psychiatric: She has a normal mood and affect. Her behavior is normal.    Vital signs in last 24 hours: Temp:  [97.9 F (36.6 C)] 97.9 F (36.6 C) (08/19 1400) Pulse Rate:  [109] 109 (08/19 1400) BP: (138)/(81) 138/81 (08/19 1400) Weight:  [85.4 kg] 85.4 kg (08/19 1400)  Labs:   Estimated body mass index is 34.42 kg/m as calculated from the following:   Height as of this encounter: 5\' 2"  (1.575 m).   Weight as of this encounter: 85.4 kg.   Imaging Review Plain radiographs demonstrate severe degenerative joint disease of the left knee(s). The overall alignment issignificant varus. The bone quality appears to be good for age and reported activity level.      Assessment/Plan:  End  stage arthritis, left knee  Principal Problem:   Primary localized osteoarthritis of left knee Active Problems:   Hypertension   Diabetes mellitus without complication (HCC)   Anxiety   The patient history, physical examination, clinical judgment of the provider and imaging studies are consistent with end stage degenerative joint disease of the left knee(s) and total knee arthroplasty is deemed medically necessary. The treatment options including medical management, injection therapy arthroscopy and arthroplasty were discussed at length. The risks and benefits of total knee arthroplasty were presented and reviewed. The risks due to aseptic loosening, infection, stiffness, patella tracking problems, thromboembolic complications and other imponderables were discussed. The patient acknowledged the explanation, agreed to proceed with the plan and consent was signed. Patient is being admitted for inpatient treatment for surgery, pain control, PT, OT, prophylactic  antibiotics, VTE prophylaxis, progressive ambulation and ADL's and discharge planning. The patient is planning to be discharged home with home health services  Patient has a left leg radiculopy.  She has failed epidurals by Dr Ron Agee and her primray care has referred her to neurosurgery for this problem   Appointment has not been made for this evaluation yet.      Patient's anticipated LOS is less than 2 midnights, meeting these requirements: - Younger than 84 - Lives within 1 hour of care - Has a competent adult at home to recover with post-op recover - NO history of  - Chronic pain requiring opiods  - Diabetes  - Coronary Artery Disease  - Heart failure  - Heart attack  - Stroke  - DVT/VTE  - Cardiac arrhythmia  - Respiratory Failure/COPD  - Renal failure  - Anemia  - Advanced Liver disease

## 2019-07-21 NOTE — Patient Instructions (Addendum)
DUE TO COVID-19 ONLY ONE VISITOR IS ALLOWED TO COME WITH YOU AND STAY IN THE WAITING ROOM ONLY DURING PRE OP AND PROCEDURE. THE ONE VISITOR MAY VISIT WITH YOU IN YOUR PRIVATE ROOM DURING VISITING HOURS ONLY!!    COVID SWAB TESTING MUST BE COMPLETED ON:  Thursday, Aug. 27, 2020 at   [address] [place] [facility] enter pre surgical testing line (Must self quarantine after testing. Follow instructions on handout.)              Your procedure is scheduled on: Monday, Aug. 31, 2020   Report to [facility] Main  Entrance   Report to Short Stay at 5:30 AM   Call this number if you have problems the morning of surgery [phone]   Do not eat food :After Midnight.   May have liquids until 4:30AM day of surgery   Complete one G2 drink the morning of surgery at 4:15AM the day of surgery.    CLEAR LIQUID DIET  Foods Allowed                                                                     Foods Excluded  Water, Black Coffee and tea, regular and decaf                             liquids that you cannot  Plain Jell-O in any flavor  (No red)                                           see through such as: Fruit ices (not with fruit pulp)                                     milk, soups, orange juice  Iced Popsicles (No red)                                    All solid food Carbonated beverages, regular and diet                                    Apple juices Sports drinks like Gatorade (No red) Lightly seasoned clear broth or consume(fat free) Sugar, honey syrup  Sample Menu Breakfast                                Lunch                                     Supper Cranberry juice                    Beef broth  Chicken broth Jell-O                                     Grape juice                           Apple juice Coffee or tea                        Jell-O                                      Popsicle                                                 Coffee or tea                        Coffee or tea      Brush your teeth the morning of surgery.    Take these medicines the morning of surgery with A SIP OF WATER: Paroxetine   THE NIGHT BEFORE SURGERY,   DO NOT TAKE EVENING DOSE OF GLIPIZIDE  DO NOT TAKE ANY DIABETIC MEDICATIONS DAY OF YOUR SURGERY                               You may not have any metal on your body including hair pins, jewelry, and body piercings             Do not wear make-up, lotions, powders, perfumes/cologne, or deodorant             Do not wear nail polish.  Do not shave  48 hours prior to surgery.               Do not bring valuables to the hospital. [place].   Contacts, dentures or bridgework may not be worn into surgery.   Bring small overnight bag day of surgery.   Special Instructions: Bring a copy of your healthcare power of attorney and living will documents         the day of surgery if you haven't scanned them in before.              Please read over the following fact sheets you were given:  How to Manage Your Diabetes Before and After Surgery  Why is it important to control my blood sugar before and after surgery? . Improving blood sugar levels before and after surgery helps healing and can limit problems. . A way of improving blood sugar control is eating a healthy diet by: o  Eating less sugar and carbohydrates o  Increasing activity/exercise o  Talking with your doctor about reaching your blood sugar goals . High blood sugars (greater than 180 mg/dL) can raise your risk of infections and slow your recovery, so you will need to focus on controlling your diabetes during the weeks before surgery. . Make sure that the doctor who takes care of  your diabetes knows about your planned surgery including the date and location.  How do I manage my blood sugar before surgery? . Check your blood sugar at least 4 times a  day, starting 2 days before surgery, to make sure that the level is not too high or low. o Check your blood sugar the morning of your surgery when you wake up and every 2 hours until you get to the Short Stay unit. . If your blood sugar is less than 70 mg/dL, you will need to treat for low blood sugar: o Do not take insulin. o Treat a low blood sugar (less than 70 mg/dL) with  cup of clear juice (cranberry or apple), 4 glucose tablets, OR glucose gel. o Recheck blood sugar in 15 minutes after treatment (to make sure it is greater than 70 mg/dL). If your blood sugar is not greater than 70 mg/dL on recheck, call [phone] for further instructions. . Report your blood sugar to the short stay nurse when you get to Short Stay.  . If you are admitted to the hospital after surgery: o Your blood sugar will be checked by the staff and you will probably be given insulin after surgery (instead of oral diabetes medicines) to make sure you have good blood sugar levels. o The goal for blood sugar control after surgery is 80-180 mg/dL.   WHAT DO I DO ABOUT MY DIABETES MEDICATION?  Marland Kitchen Do not take oral diabetes medicines (pills) the morning of surgery.   DO NOT TAKE EVENING DOSE OF GLIPIZIDE THE NIGHT BEFORE SURGERY     . The day of surgery, do not take other diabetes injectables, including Byetta (exenatide), Bydureon (exenatide ER), Victoza (liraglutide), or Trulicity (dulaglutide).  . If your CBG is greater than 220 mg/dL, you may take  of your sliding scale  . (correction) dose of insulin.     Patient Signature:  Date:   Nurse Signature:  Date:   Reviewed and Endorsed by [facility] Patient Education Committee, August 2015    [facility] - Preparing for Surgery Before surgery, you can play an important role.  Because skin is not sterile, your skin needs to be as free of germs as possible.  You can reduce the number of germs on your skin by washing with CHG (chlorahexidine gluconate) soap  before surgery.  CHG is an antiseptic cleaner which kills germs and bonds with the skin to continue killing germs even after washing. Please DO NOT use if you have an allergy to CHG or antibacterial soaps.  If your skin becomes reddened/irritated stop using the CHG and inform your nurse when you arrive at Short Stay. Do not shave (including legs and underarms) for at least 48 hours prior to the first CHG shower.  You may shave your face/neck.  Please follow these instructions carefully:  1.  Shower with CHG Soap the night before surgery and the  morning of surgery.  2.  If you choose to wash your hair, wash your hair first as usual with your normal  shampoo.  3.  After you shampoo, rinse your hair and body thoroughly to remove the shampoo.                             4.  Use CHG as you would any other liquid soap.  You can apply chg directly to the skin and wash.  Gently with a scrungie or clean washcloth.  5.  Apply the CHG Soap to your body ONLY FROM THE NECK DOWN.   Do   not use on face/ open                           Wound or open sores. Avoid contact with eyes, ears mouth and   genitals (private parts).                       Wash face,  Genitals (private parts) with your normal soap.             6.  Wash thoroughly, paying special attention to the area where your    surgery  will be performed.  7.  Thoroughly rinse your body with warm water from the neck down.  8.  DO NOT shower/wash with your normal soap after using and rinsing off the CHG Soap.                9.  Pat yourself dry with a clean towel.            10.  Wear clean pajamas.            11.  Place clean sheets on your bed the night of your first shower and do not  sleep with pets. Day of Surgery : Do not apply any lotions/deodorants the morning of surgery.  Please wear clean clothes to the hospital/surgery center.  FAILURE TO FOLLOW THESE INSTRUCTIONS MAY RESULT IN THE CANCELLATION OF YOUR SURGERY  PATIENT  SIGNATURE_________________________________  NURSE SIGNATURE__________________________________  ________________________________________________________________________   Adam Phenix  An incentive spirometer is a tool that can help keep your lungs clear and active. This tool measures how well you are filling your lungs with each breath. Taking long deep breaths may help reverse or decrease the chance of developing breathing (pulmonary) problems (especially infection) following:  A long period of time when you are unable to move or be active. BEFORE THE PROCEDURE   If the spirometer includes an indicator to show your best effort, your nurse or respiratory therapist will set it to a desired goal.  If possible, sit up straight or lean slightly forward. Try not to slouch.  Hold the incentive spirometer in an upright position. INSTRUCTIONS FOR USE  1. Sit on the edge of your bed if possible, or sit up as far as you can in bed or on a chair. 2. Hold the incentive spirometer in an upright position. 3. Breathe out normally. 4. Place the mouthpiece in your mouth and seal your lips tightly around it. 5. Breathe in slowly and as deeply as possible, raising the piston or the ball toward the top of the column. 6. Hold your breath for 3-5 seconds or for as long as possible. Allow the piston or ball to fall to the bottom of the column. 7. Remove the mouthpiece from your mouth and breathe out normally. 8. Rest for a few seconds and repeat Steps 1 through 7 at least 10 times every 1-2 hours when you are awake. Take your time and take a few normal breaths between deep breaths. 9. The spirometer may include an indicator to show your best effort. Use the indicator as a goal to work toward during each repetition. 10. After each set of 10 deep breaths, practice coughing to be sure your lungs are clear. If you have an incision (the cut made at the time of surgery), support your incision  when coughing  by placing a pillow or rolled up towels firmly against it. Once you are able to get out of bed, walk around indoors and cough well. You may stop using the incentive spirometer when instructed by your caregiver.  RISKS AND COMPLICATIONS  Take your time so you do not get dizzy or light-headed.  If you are in pain, you may need to take or ask for pain medication before doing incentive spirometry. It is harder to take a deep breath if you are having pain. AFTER USE  Rest and breathe slowly and easily.  It can be helpful to keep track of a log of your progress. Your caregiver can provide you with a simple table to help with this. If you are using the spirometer at home, follow these instructions: [place] IF:   You are having difficultly using the spirometer.  You have trouble using the spirometer as often as instructed.  Your pain medication is not giving enough relief while using the spirometer.  You develop fever of 100.5 F (38.1 C) or higher. SEEK IMMEDIATE MEDICAL CARE IF:   You cough up bloody sputum that had not been present before.  You develop fever of 102 F (38.9 C) or greater.  You develop worsening pain at or near the incision site. MAKE SURE YOU:   Understand these instructions.  Will watch your condition.  Will get help right away if you are not doing well or get worse. Document Released: 03/30/2007 Document Revised: 02/09/2012 Document Reviewed: 05/31/2007 Exit[employer] Patient Information 2014 [employer], [place].   ________________________________________________________________________  WHAT IS A BLOOD TRANSFUSION? Blood Transfusion Information  A transfusion is the replacement of blood or some of its parts. Blood is made up of multiple cells which provide different functions.  Red blood cells carry oxygen and are used for blood loss replacement.  White blood cells fight against infection.  Platelets control bleeding.  Plasma helps clot  blood.  Other blood products are available for specialized needs, such as hemophilia or other clotting disorders. BEFORE THE TRANSFUSION  Who gives blood for transfusions?   Healthy volunteers who are fully evaluated to make sure their blood is safe. This is blood bank blood. Transfusion therapy is the safest it has ever been in the practice of medicine. Before blood is taken from a donor, a complete history is taken to make sure that person has no history of diseases nor engages in risky social behavior (examples are intravenous drug use or sexual activity with multiple partners). The donor's travel history is screened to minimize risk of transmitting infections, such as malaria. The donated blood is tested for signs of infectious diseases, such as HIV and hepatitis. The blood is then tested to be sure it is compatible with you in order to minimize the chance of a transfusion reaction. If you or a relative donates blood, this is often done in anticipation of surgery and is not appropriate for emergency situations. It takes many days to process the donated blood. RISKS AND COMPLICATIONS Although transfusion therapy is very safe and saves many lives, the main dangers of transfusion include:   Getting an infectious disease.  Developing a transfusion reaction. This is an allergic reaction to something in the blood you were given. Every precaution is taken to prevent this. The decision to have a blood transfusion has been considered carefully by your caregiver before blood is given. Blood is not given unless the benefits outweigh the risks. AFTER THE TRANSFUSION  Right after  receiving a blood transfusion, you will usually feel much better and more energetic. This is especially true if your red blood cells have gotten low (anemic). The transfusion raises the level of the red blood cells which carry oxygen, and this usually causes an energy increase.  The nurse administering the transfusion will monitor  you carefully for complications. HOME CARE INSTRUCTIONS  No special instructions are needed after a transfusion. You may find your energy is better. Speak with your caregiver about any limitations on activity for underlying diseases you may have. SEEK MEDICAL CARE IF:   Your condition is not improving after your transfusion.  You develop redness or irritation at the intravenous (IV) site. SEEK IMMEDIATE MEDICAL CARE IF:  Any of the following symptoms occur over the next 12 hours:  Shaking chills.  You have a temperature by mouth above 102 F (38.9 C), not controlled by medicine.  Chest, back, or muscle pain.  People around you feel you are not acting correctly or are confused.  Shortness of breath or difficulty breathing.  Dizziness and fainting.  You get a rash or develop hives.  You have a decrease in urine output.  Your urine turns a dark color or changes to pink, red, or brown. Any of the following symptoms occur over the next 10 days:  You have a temperature by mouth above 102 F (38.9 C), not controlled by medicine.  Shortness of breath.  Weakness after normal activity.  The white part of the eye turns yellow (jaundice).  You have a decrease in the amount of urine or are urinating less often.  Your urine turns a dark color or changes to pink, red, or brown. Document Released: 11/14/2000 Document Revised: 02/09/2012 Document Reviewed: 07/03/2008 [facility] Patient Information 2014 [place], [place].  _______________________________________________________________________

## 2019-07-22 ENCOUNTER — Encounter (HOSPITAL_COMMUNITY)
Admission: RE | Admit: 2019-07-22 | Discharge: 2019-07-22 | Disposition: A | Discharge status: Home / Self Care | Payer: Medicare HMO | Source: Ambulatory Visit | Attending: Orthopedic Surgery | Admitting: Orthopedic Surgery

## 2019-07-22 ENCOUNTER — Encounter (HOSPITAL_COMMUNITY): Payer: Self-pay

## 2019-07-22 ENCOUNTER — Other Ambulatory Visit: Payer: Self-pay

## 2019-07-22 ENCOUNTER — Encounter (INDEPENDENT_AMBULATORY_CARE_PROVIDER_SITE_OTHER): Payer: Self-pay

## 2019-07-22 DIAGNOSIS — Z01818 Encounter for other preprocedural examination: Secondary | ICD-10-CM | POA: Insufficient documentation

## 2019-07-22 HISTORY — DX: Syncope and collapse: R55

## 2019-07-22 LAB — CBC WITH DIFFERENTIAL/PLATELET
Abs Immature Granulocytes: 0.04 10*3/uL (ref 0.00–0.07)
Basophils Absolute: 0 10*3/uL (ref 0.0–0.1)
Basophils Relative: 0 %
Eosinophils Absolute: 0.2 10*3/uL (ref 0.0–0.5)
Eosinophils Relative: 3 %
HCT: 38.9 % (ref 36.0–46.0)
Hemoglobin: 12.4 g/dL (ref 12.0–15.0)
Immature Granulocytes: 0 %
Lymphocytes Relative: 29 %
Lymphs Abs: 2.7 10*3/uL (ref 0.7–4.0)
MCH: 29.6 pg (ref 26.0–34.0)
MCHC: 31.9 g/dL (ref 30.0–36.0)
MCV: 92.8 fL (ref 80.0–100.0)
Monocytes Absolute: 0.6 10*3/uL (ref 0.1–1.0)
Monocytes Relative: 6 %
Neutro Abs: 5.7 10*3/uL (ref 1.7–7.7)
Neutrophils Relative %: 62 %
Platelets: 228 10*3/uL (ref 150–400)
RBC: 4.19 MIL/uL (ref 3.87–5.11)
RDW: 12.5 % (ref 11.5–15.5)
WBC: 9.3 10*3/uL (ref 4.0–10.5)
nRBC: 0 % (ref 0.0–0.2)

## 2019-07-22 LAB — HEMOGLOBIN A1C
Hgb A1c MFr Bld: 8.5 % — ABNORMAL HIGH (ref 4.8–5.6)
Mean Plasma Glucose: 197.25 mg/dL

## 2019-07-22 LAB — COMPREHENSIVE METABOLIC PANEL
ALT: 26 U/L (ref 0–44)
AST: 18 U/L (ref 15–41)
Albumin: 3.8 g/dL (ref 3.5–5.0)
Alkaline Phosphatase: 54 U/L (ref 38–126)
Anion gap: 8 (ref 5–15)
BUN: 24 mg/dL — ABNORMAL HIGH (ref 8–23)
CO2: 28 mmol/L (ref 22–32)
Calcium: 9.8 mg/dL (ref 8.9–10.3)
Chloride: 101 mmol/L (ref 98–111)
Creatinine, Ser: 1.12 mg/dL — ABNORMAL HIGH (ref 0.44–1.00)
GFR calc Af Amer: 60 mL/min — ABNORMAL LOW (ref >60)
GFR calc non Af Amer: 52 mL/min — ABNORMAL LOW (ref >60)
Glucose, Bld: 195 mg/dL — ABNORMAL HIGH (ref 70–99)
Potassium: 4.5 mmol/L (ref 3.5–5.1)
Sodium: 137 mmol/L (ref 135–145)
Total Bilirubin: 0.4 mg/dL (ref 0.3–1.2)
Total Protein: 6.3 g/dL — ABNORMAL LOW (ref 6.5–8.1)

## 2019-07-22 LAB — PROTIME-INR
INR: 0.9 (ref 0.8–1.2)
Prothrombin Time: 12.3 seconds (ref 11.4–15.2)

## 2019-07-22 LAB — SURGICAL PCR SCREEN
MRSA, PCR: NEGATIVE
Staphylococcus aureus: POSITIVE — AB

## 2019-07-22 LAB — GLUCOSE, CAPILLARY: Glucose-Capillary: 198 mg/dL — ABNORMAL HIGH (ref 70–99)

## 2019-07-22 LAB — APTT: aPTT: 27 seconds (ref 24–36)

## 2019-07-22 NOTE — Progress Notes (Signed)
PCP - tamika lott, per patient lov was earlier this year  Cardiologist - none since 2010 syncope and collapse   Chest x-ray - n/a EKG - epic 07-22-2019 pending confirmation  Stress Test - n/a ECHO - last 2010 done in [place]  Sleep Study - n/a CPAP - n/a  Fasting Blood Sugar -  Checks Blood Sugar _____ times a day----minimum once daily   Blood Thinner Instructions:n/a Aspirin Instructions:n/a Last Dose:n/a  Anesthesia review:   HGBA1C 8.5 at pre-op   Also Patient reports hx of syncope and collapse .  first occurrence 40 years ago , last occurrence 10 years ago , saw cardio in [place] (dr Posey Pronto , cannot recall first name of physician)  for eval , underwent echo and per patient results were normal and was told to f/u with cardio if syncope reoccurs, patient reports no reoccurence of syncope since that time . No dx OSA but reports daytime somnolence, and family reporting loud snoring. Reports prior to syncope incidences, she would suddenly feel "very sleepy" then would just allow herself to sleep, denies accompanied sob, palpitations, chest pain, change in vision, or n/v   Patient denies shortness of breath, fever, cough and chest pain at PAT appointment   Patient verbalized understanding of instructions that were given to them at the PAT appointment. Patient was also instructed that they will need to review over the PAT instructions again at home before surgery.

## 2019-07-23 LAB — URINE CULTURE

## 2019-07-28 ENCOUNTER — Other Ambulatory Visit (HOSPITAL_COMMUNITY): Payer: Medicare HMO

## 2019-08-16 ENCOUNTER — Other Ambulatory Visit: Payer: Self-pay

## 2019-08-16 ENCOUNTER — Ambulatory Visit
Admission: EM | Admit: 2019-08-16 | Discharge: 2019-08-16 | Disposition: A | Discharge status: Home / Self Care | Payer: Medicare HMO

## 2019-08-16 ENCOUNTER — Encounter: Payer: Self-pay | Admitting: Emergency Medicine

## 2019-08-16 DIAGNOSIS — H1132 Conjunctival hemorrhage, left eye: Secondary | ICD-10-CM | POA: Diagnosis not present

## 2019-08-16 NOTE — ED Triage Notes (Signed)
Pt said left eye issue stated yesterday " No burning/itchy  Or drainage".  No injury to eye per patient.,

## 2019-08-16 NOTE — ED Provider Notes (Signed)
[facility]    CSN: [id number] Arrival date & time: 08/16/19  1152      History   Chief Complaint Chief Complaint  Patient presents with  . Conjunctivitis    HPI Tiffany Robles is a 63 y.o. female with history of diabetes, hypertension presenting for left eye redness.  States a friend pointed this out on Friday, has worsened since.  Patient states that she felt "totally fine" and "would have noticed it if they didn't point it out to me".  Patient denies trauma to the area, seizure, eye pain, photophobia, change in vision.  Patient wears glasses: Denies history of contact lens use.  No change in medications, uncontrolled blood pressure since symptom onset.  Past Medical History:  Diagnosis Date  . Anxiety   . Arthritis   . Diabetes mellitus without complication ([place])    type 2   . Hypertension   . Primary localized osteoarthritis of left knee   . Syncope and collapse    first occurrence 40 years ago , last occurrence 10 years ago , saw cardio for eval , underwent echo and per patient results were normal and was told to f/u with cardio if syncope reoccurs, patient reports no reoccurence of syncope since that time .    Patient Active Problem List   Diagnosis Date Noted  . Primary localized osteoarthritis of left knee   . Hypertension   . Diabetes mellitus without complication ([place])   . Anxiety     Past Surgical History:  Procedure Laterality Date  . ABDOMINAL HYSTERECTOMY      OB History    Gravida  6   Para      Term      Preterm      AB  2   Living  4     SAB  2   TAB      Ectopic      Multiple      Live Births  4            Home Medications    Prior to Admission medications   Medication Sig Start Date End Date Taking? Authorizing Provider  Calcium Carb-Cholecalciferol (CALCIUM 600 + D PO) Take 1 tablet by mouth 2 (two) times daily.   Yes [provider]  Dulaglutide (TRULICITY) 1.5 0000000 SOPN Inject 1.5 mg into  the skin every Friday.    Yes [provider]  glipiZIDE (GLUCOTROL) 5 MG tablet Take 5 mg by mouth 2 (two) times daily.   Yes [provider]  ibuprofen (ADVIL,MOTRIN) 800 MG tablet Take 800 mg by mouth 2 (two) times daily as needed for moderate pain.    Yes [provider]  lisinopril-hydrochlorothiazide (ZESTORETIC) 20-25 MG tablet Take 1 tablet by mouth daily.   Yes [provider]  metFORMIN (GLUCOPHAGE) 500 MG tablet Take 1,000 mg by mouth 2 (two) times daily.   Yes [provider]  Multiple Vitamin (MULTIVITAMIN WITH MINERALS) TABS tablet Take 1 tablet by mouth 2 (two) times daily.   Yes [provider]  PARoxetine (PAXIL) 30 MG tablet Take 60 mg by mouth daily.   Yes [provider]  pregabalin (LYRICA) 75 MG capsule Take 75 mg by mouth 2 (two) times daily.   Yes [provider]  tiZANidine (ZANAFLEX) 4 MG tablet Take 4 mg by mouth 2 (two) times daily as needed for muscle spasms.   Yes [provider]  traZODone (DESYREL) 100 MG tablet Take 100 mg  by mouth at bedtime.    [provider]  diphenhydrAMINE (BENADRYL) 25 MG tablet Take 25 mg by mouth daily as needed for allergies.  08/16/19  [provider]    Family History Family History  Problem Relation Age of Onset  . Diabetes Mother   . Hypertension Mother   . Diabetes Father   . Hypertension Father   . Diabetes Brother   . Hypertension Brother   . Breast cancer Neg Hx     Social History Social History   Tobacco Use  . Smoking status: Former Research scientist (life sciences)  . Smokeless tobacco: Never Used  Substance Use Topics  . Alcohol use: Yes    Comment: occassionally  . Drug use: Never     Allergies   Augmentin [amoxicillin-pot clavulanate], Codeine, Ivp dye [iodinated diagnostic agents], and Penicillins   Review of Systems Review of Systems  Constitutional: Negative for fatigue and fever.  HENT: Negative for congestion, dental  problem, ear pain, facial swelling, hearing loss, sinus pain, sore throat, trouble swallowing and voice change.   Eyes: Positive for redness. Negative for photophobia, pain, discharge, itching and visual disturbance.  Respiratory: Negative for cough and shortness of breath.   Cardiovascular: Negative for chest pain and palpitations.  Gastrointestinal: Negative for abdominal pain, diarrhea and vomiting.  Musculoskeletal: Negative for arthralgias and myalgias.  Skin: Negative for rash and wound.  Neurological: Negative for dizziness, syncope and headaches.     Physical Exam Triage Vital Signs ED Triage Vitals  Enc Vitals Group     BP      Pulse      Resp      Temp      Temp src      SpO2      Weight      Height      Head Circumference      Peak Flow      Pain Score      Pain Loc      Pain Edu?      Excl. in [place]?    No data found.  Updated Vital Signs BP 138/81   Pulse 83   Temp 98.2 F (36.8 C)   Resp 18   SpO2 98%   Visual Acuity Right Eye Distance:   Left Eye Distance:   Bilateral Distance:    Right Eye Near: R Near: 20/30 Left Eye Near:  L Near: 20/40 Bilateral Near:  20/25(With correction)  Physical Exam Constitutional:      General: She is not in acute distress.    Appearance: She is obese. She is not ill-appearing.  HENT:     Head: Normocephalic and atraumatic.     Jaw: There is normal jaw occlusion. No tenderness or pain on movement.     Right Ear: Hearing, tympanic membrane, ear canal and external ear normal. No tenderness. No mastoid tenderness.     Left Ear: Hearing, tympanic membrane, ear canal and external ear normal. No tenderness. No mastoid tenderness.     Nose: Nose normal. No nasal deformity, septal deviation or nasal tenderness.     Right Turbinates: Not swollen or pale.     Left Turbinates: Not swollen or pale.     Right Sinus: No maxillary sinus tenderness or frontal sinus tenderness.     Left Sinus: No maxillary sinus tenderness or  frontal sinus tenderness.     Comments: Nares without blood    Mouth/Throat:     Lips: Pink. No lesions.  Mouth: Mucous membranes are moist. No injury.     Pharynx: Oropharynx is clear. Uvula midline. No posterior oropharyngeal erythema or uvula swelling.     Comments: no tonsillar exudate or hypertrophy Eyes:     General: No scleral icterus.       Right eye: No discharge.        Left eye: No discharge.     Extraocular Movements: Extraocular movements intact.     Pupils: Pupils are equal, round, and reactive to light.     Comments: Left eye with lateral subconjunctival hemorrhage that spares the limbus.  EOMs and orbital palpation are nontender.  No foreign bodies identified when upper and lower lids are everted.  Neck:     Musculoskeletal: Normal range of motion and neck supple. No muscular tenderness.  Cardiovascular:     Rate and Rhythm: Normal rate and regular rhythm.     Heart sounds: No murmur. No gallop.   Pulmonary:     Effort: Pulmonary effort is normal. No respiratory distress.     Breath sounds: No wheezing.  Lymphadenopathy:     Cervical: No cervical adenopathy.  Skin:    Coloration: Skin is not jaundiced or pale.  Neurological:     Mental Status: She is alert and oriented to person, place, and time.      UC Treatments / Results  Labs (all labs ordered are listed, but only abnormal results are displayed) Labs Reviewed - No data to display  EKG   Radiology No results found.  Procedures Procedures (including critical care time)  Medications Ordered in UC Medications - No data to display  Initial Impression / Assessment and Plan / UC Course  I have reviewed the triage vital signs and the nursing notes.  Pertinent labs & imaging results that were available during my care of the patient were reviewed by me and considered in my medical decision making (see chart for details).     1.  Subconjunctival hemorrhage of left eye Atraumatic, patient without  fever or hemodynamic instability today.  Vision slightly worse on left side as compared to right, the patient states this is stable for her.  Visual fields intact.  No neurocognitive deficits on exam.  Reviewed expected course of resolution.  Patient to follow-up with ophthalmology within 1 week, or sooner if she develops symptoms outlined below.  Return precautions discussed, patient verbalized understanding and is agreeable to plan. Final Clinical Impressions(s) / UC Diagnoses   Final diagnoses:  Subconjunctival hemorrhage of left eye     Discharge Instructions     Go to eye doctor, ER immediately if you develop change in vision (or see bright, flashing lights, black curtain drape across your visual field), severe eye pain or eye swelling.    ED Prescriptions    None     Controlled Substance Prescriptions [place] Controlled Substance Registry consulted? Not Applicable   Quincy Sheehan, [place] 08/16/19 1242

## 2019-08-16 NOTE — Discharge Instructions (Addendum)
Go to eye doctor, ER immediately if you develop change in vision (or see bright, flashing lights, black curtain drape across your visual field), severe eye pain or eye swelling.

## 2019-08-16 NOTE — ED Notes (Signed)
Patient able to ambulate independently  

## 2019-08-17 ENCOUNTER — Telehealth: Payer: Self-pay | Admitting: Emergency Medicine

## 2019-09-15 NOTE — Patient Instructions (Addendum)
DUE TO COVID-19 ONLY ONE VISITOR IS ALLOWED TO COME WITH YOU AND STAY IN THE WAITING ROOM ONLY DURING PRE OP AND PROCEDURE DAY OF SURGERY. THE 1 VISITOR MAY VISIT WITH YOU AFTER SURGERY IN YOUR PRIVATE ROOM DURING VISITING HOURS ONLY!  YOU NEED TO HAVE A COVID 19 TEST ON_Thursday 10/22/2020______ @___1050  am____, THIS TEST MUST BE DONE BEFORE SURGERY, COME  [place], [address] , [id number].  ([place]) ONCE YOUR COVID TEST IS COMPLETED, PLEASE BEGIN THE QUARANTINE INSTRUCTIONS AS OUTLINED IN YOUR HANDOUT.                Sylver Blechman    Your procedure is scheduled on: Monday 09/26/2019   Report to [facility] Main  Entrance    Report to Short Stay at 5:30am     Call this number if you have problems the morning of surgery [phone]    Remember: Do not eat food or drink liquids after Midnight.    BRUSH YOUR TEETH MORNING OF SURGERY AND RINSE YOUR MOUTH OUT, NO CHEWING GUM CANDY OR MINTS.     Take these medicines the morning of surgery with A SIP OF WATER: Lyrica and Paxil   DO NOT TAKE ANY DIABETIC MEDICATIONS DAY OF YOUR SURGERY!   How to Manage Your Diabetes Before and After Surgery  Why is it important to control my blood sugar before and after surgery? . Improving blood sugar levels before and after surgery helps healing and can limit problems. . A way of improving blood sugar control is eating a healthy diet by: o  Eating less sugar and carbohydrates o  Increasing activity/exercise o  Talking with your doctor about reaching your blood sugar goals . High blood sugars (greater than 180 mg/dL) can raise your risk of infections and slow your recovery, so you will need to focus on controlling your diabetes during the weeks before surgery. . Make sure that the doctor who takes care of your diabetes knows about your planned surgery including the date and location.  How do I manage my blood sugar before surgery? . Check your blood sugar at least  4 times a day, starting 2 days before surgery, to make sure that the level is not too high or low. o Check your blood sugar the morning of your surgery when you wake up and every 2 hours until you get to the Short Stay unit. . If your blood sugar is less than 70 mg/dL, you will need to treat for low blood sugar: o Do not take insulin. o Treat a low blood sugar (less than 70 mg/dL) with  cup of clear juice (cranberry or apple), 4 glucose tablets, OR glucose gel. o Recheck blood sugar in 15 minutes after treatment (to make sure it is greater than 70 mg/dL). If your blood sugar is not greater than 70 mg/dL on recheck, call [phone] for further instructions. . Report your blood sugar to the short stay nurse when you get to Short Stay.  . If you are admitted to the hospital after surgery: o Your blood sugar will be checked by the staff and you will probably be given insulin after surgery (instead of oral diabetes medicines) to make sure you have good blood sugar levels. o The goal for blood sugar control after surgery is 80-180 mg/dL.   WHAT DO I DO ABOUT MY DIABETES MEDICATION?        The day before surgery, Take Metformin as usual.  The day before surgery, Take Glipizide in the morning but NO evening dose.  . Do not take oral diabetes medicines (pills) the morning of surgery.                       You may not have any metal on your body including hair pins and              piercings  Do not wear jewelry, make-up, lotions, powders or perfumes, deodorant             Do not wear nail polish on your fingernails.  Do not shave  48 hours prior to surgery.              Men may shave face and neck.   Do not bring valuables to the hospital. [place].  Contacts, dentures or bridgework may not be worn into surgery.  Leave suitcase in the car. After surgery it may be brought to your room.                Please read over the following  fact sheets you were given: _____________________________________________________________________             [facility] - Preparing for Surgery Before surgery, you can play an important role.  Because skin is not sterile, your skin needs to be as free of germs as possible.  You can reduce the number of germs on your skin by washing with CHG (chlorahexidine gluconate) soap before surgery.  CHG is an antiseptic cleaner which kills germs and bonds with the skin to continue killing germs even after washing. Please DO NOT use if you have an allergy to CHG or antibacterial soaps.  If your skin becomes reddened/irritated stop using the CHG and inform your nurse when you arrive at Short Stay. Do not shave (including legs and underarms) for at least 48 hours prior to the first CHG shower.  You may shave your face/neck. Please follow these instructions carefully:  1.  Shower with CHG Soap the night before surgery and the  morning of Surgery.  2.  If you choose to wash your hair, wash your hair first as usual with your  normal  shampoo.  3.  After you shampoo, rinse your hair and body thoroughly to remove the  shampoo.                            4.  Use CHG as you would any other liquid soap.  You can apply chg directly  to the skin and wash                       Gently with a scrungie or clean washcloth.  5.  Apply the CHG Soap to your body ONLY FROM THE NECK DOWN.   Do not use on face/ open                           Wound or open sores. Avoid contact with eyes, ears mouth and genitals (private parts).                       Wash face,  Genitals (private parts) with your normal soap.  6.  Wash thoroughly, paying special attention to the area where your surgery  will be performed.  7.  Thoroughly rinse your body with warm water from the neck down.  8.  DO NOT shower/wash with your normal soap after using and rinsing off  the CHG Soap.                9.  Pat yourself dry with a clean towel.             10.  Wear clean pajamas.            11.  Place clean sheets on your bed the night of your first shower and do not  sleep with pets. Day of Surgery : Do not apply any lotions/deodorants the morning of surgery.  Please wear clean clothes to the hospital/surgery center.  FAILURE TO FOLLOW THESE INSTRUCTIONS MAY RESULT IN THE CANCELLATION OF YOUR SURGERY PATIENT SIGNATURE_________________________________  NURSE SIGNATURE__________________________________  ________________________________________________________________________   Adam Phenix  An incentive spirometer is a tool that can help keep your lungs clear and active. This tool measures how well you are filling your lungs with each breath. Taking long deep breaths may help reverse or decrease the chance of developing breathing (pulmonary) problems (especially infection) following:  A long period of time when you are unable to move or be active. BEFORE THE PROCEDURE   If the spirometer includes an indicator to show your best effort, your nurse or respiratory therapist will set it to a desired goal.  If possible, sit up straight or lean slightly forward. Try not to slouch.  Hold the incentive spirometer in an upright position. INSTRUCTIONS FOR USE  1. Sit on the edge of your bed if possible, or sit up as far as you can in bed or on a chair. 2. Hold the incentive spirometer in an upright position. 3. Breathe out normally. 4. Place the mouthpiece in your mouth and seal your lips tightly around it. 5. Breathe in slowly and as deeply as possible, raising the piston or the ball toward the top of the column. 6. Hold your breath for 3-5 seconds or for as long as possible. Allow the piston or ball to fall to the bottom of the column. 7. Remove the mouthpiece from your mouth and breathe out normally. 8. Rest for a few seconds and repeat Steps 1 through 7 at least 10 times every 1-2 hours when you are awake. Take your time and  take a few normal breaths between deep breaths. 9. The spirometer may include an indicator to show your best effort. Use the indicator as a goal to work toward during each repetition. 10. After each set of 10 deep breaths, practice coughing to be sure your lungs are clear. If you have an incision (the cut made at the time of surgery), support your incision when coughing by placing a pillow or rolled up towels firmly against it. Once you are able to get out of bed, walk around indoors and cough well. You may stop using the incentive spirometer when instructed by your caregiver.  RISKS AND COMPLICATIONS  Take your time so you do not get dizzy or light-headed.  If you are in pain, you may need to take or ask for pain medication before doing incentive spirometry. It is harder to take a deep breath if you are having pain. AFTER USE  Rest and breathe slowly and easily.  It can be helpful to keep track of a log of  your progress. Your caregiver can provide you with a simple table to help with this. If you are using the spirometer at home, follow these instructions: [place] IF:   You are having difficultly using the spirometer.  You have trouble using the spirometer as often as instructed.  Your pain medication is not giving enough relief while using the spirometer.  You develop fever of 100.5 F (38.1 C) or higher. SEEK IMMEDIATE MEDICAL CARE IF:   You cough up bloody sputum that had not been present before.  You develop fever of 102 F (38.9 C) or greater.  You develop worsening pain at or near the incision site. MAKE SURE YOU:   Understand these instructions.  Will watch your condition.  Will get help right away if you are not doing well or get worse. Document Released: 03/30/2007 Document Revised: 02/09/2012 Document Reviewed: 05/31/2007 [facility] Patient Information 2014 [place], [place].   ________________________________________________________________________

## 2019-09-16 ENCOUNTER — Encounter (HOSPITAL_COMMUNITY): Payer: Self-pay

## 2019-09-16 ENCOUNTER — Encounter (HOSPITAL_COMMUNITY)
Admission: RE | Admit: 2019-09-16 | Discharge: 2019-09-16 | Disposition: A | Discharge status: Home / Self Care | Payer: Medicare HMO | Source: Ambulatory Visit | Attending: Orthopedic Surgery | Admitting: Orthopedic Surgery

## 2019-09-16 ENCOUNTER — Encounter (INDEPENDENT_AMBULATORY_CARE_PROVIDER_SITE_OTHER): Payer: Self-pay

## 2019-09-16 ENCOUNTER — Other Ambulatory Visit: Payer: Self-pay

## 2019-09-16 DIAGNOSIS — Z01812 Encounter for preprocedural laboratory examination: Secondary | ICD-10-CM | POA: Diagnosis present

## 2019-09-16 DIAGNOSIS — M1712 Unilateral primary osteoarthritis, left knee: Secondary | ICD-10-CM | POA: Diagnosis not present

## 2019-09-16 HISTORY — DX: Family history of other specified conditions: Z84.89

## 2019-09-16 LAB — SURGICAL PCR SCREEN
MRSA, PCR: NEGATIVE
Staphylococcus aureus: POSITIVE — AB

## 2019-09-16 LAB — COMPREHENSIVE METABOLIC PANEL
ALT: 30 U/L (ref 0–44)
AST: 22 U/L (ref 15–41)
Albumin: 4.2 g/dL (ref 3.5–5.0)
Alkaline Phosphatase: 44 U/L (ref 38–126)
Anion gap: 8 (ref 5–15)
BUN: 36 mg/dL — ABNORMAL HIGH (ref 8–23)
CO2: 26 mmol/L (ref 22–32)
Calcium: 10.2 mg/dL (ref 8.9–10.3)
Chloride: 104 mmol/L (ref 98–111)
Creatinine, Ser: 1 mg/dL (ref 0.44–1.00)
GFR calc Af Amer: >60 mL/min (ref >60)
GFR calc non Af Amer: 59 mL/min — ABNORMAL LOW (ref >60)
Glucose, Bld: 123 mg/dL — ABNORMAL HIGH (ref 70–99)
Potassium: 4.8 mmol/L (ref 3.5–5.1)
Sodium: 138 mmol/L (ref 135–145)
Total Bilirubin: 0.6 mg/dL (ref 0.3–1.2)
Total Protein: 6.8 g/dL (ref 6.5–8.1)

## 2019-09-16 LAB — CBC WITH DIFFERENTIAL/PLATELET
Abs Immature Granulocytes: 0.02 10*3/uL (ref 0.00–0.07)
Basophils Absolute: 0 10*3/uL (ref 0.0–0.1)
Basophils Relative: 0 %
Eosinophils Absolute: 0.2 10*3/uL (ref 0.0–0.5)
Eosinophils Relative: 2 %
HCT: 38.5 % (ref 36.0–46.0)
Hemoglobin: 12.7 g/dL (ref 12.0–15.0)
Immature Granulocytes: 0 %
Lymphocytes Relative: 28 %
Lymphs Abs: 2.4 10*3/uL (ref 0.7–4.0)
MCH: 30.5 pg (ref 26.0–34.0)
MCHC: 33 g/dL (ref 30.0–36.0)
MCV: 92.3 fL (ref 80.0–100.0)
Monocytes Absolute: 0.5 10*3/uL (ref 0.1–1.0)
Monocytes Relative: 6 %
Neutro Abs: 5.3 10*3/uL (ref 1.7–7.7)
Neutrophils Relative %: 64 %
Platelets: 249 10*3/uL (ref 150–400)
RBC: 4.17 MIL/uL (ref 3.87–5.11)
RDW: 13.2 % (ref 11.5–15.5)
WBC: 8.5 10*3/uL (ref 4.0–10.5)
nRBC: 0 % (ref 0.0–0.2)

## 2019-09-16 LAB — APTT: aPTT: 27 seconds (ref 24–36)

## 2019-09-16 LAB — HEMOGLOBIN A1C
Hgb A1c MFr Bld: 7.7 % — ABNORMAL HIGH (ref 4.8–5.6)
Mean Plasma Glucose: 174.29 mg/dL

## 2019-09-16 LAB — GLUCOSE, CAPILLARY: Glucose-Capillary: 155 mg/dL — ABNORMAL HIGH (ref 70–99)

## 2019-09-16 LAB — PROTIME-INR
INR: 1 (ref 0.8–1.2)
Prothrombin Time: 12.6 seconds (ref 11.4–15.2)

## 2019-09-16 NOTE — Progress Notes (Addendum)
PCP - Dr. Vassie Moment Cardiologist - none  Chest x-ray -none  EKG - 07/22/2019 Stress Test - 10 years ago-foe syncope episode-negative results ECHO -none  Cardiac Cath - none  Sleep Study - none CPAP - none  Fasting Blood Sugar -185-193  Checks Blood Sugar __2___ times a day  Blood Thinner Instructions:none Aspirin Instructions:none Takes Ibuprofen 800 mg- Patient was instructed to call Dr. Maebelle Munroe and get instructions as to when to stop it. Last Dose:  Anesthesia review:  Chart sent to Konrad Felix, PA for reviewlabs  Patient has a history of DM, HTN  Patient denies shortness of breath, fever, cough and chest pain at PAT appointment   Patient verbalized understanding of instructions that were given to them at the PAT appointment. Patient was also instructed that they will need to review over the PAT instructions again at home before surgery.

## 2019-09-17 LAB — URINE CULTURE: Culture: <10000 — AB

## 2019-09-22 ENCOUNTER — Other Ambulatory Visit (HOSPITAL_COMMUNITY)
Admission: RE | Admit: 2019-09-22 | Discharge: 2019-09-22 | Disposition: A | Discharge status: Home / Self Care | Payer: Medicare HMO | Source: Ambulatory Visit | Attending: Orthopedic Surgery | Admitting: Orthopedic Surgery

## 2019-09-22 DIAGNOSIS — Z20828 Contact with and (suspected) exposure to other viral communicable diseases: Secondary | ICD-10-CM | POA: Diagnosis not present

## 2019-09-22 DIAGNOSIS — Z01812 Encounter for preprocedural laboratory examination: Secondary | ICD-10-CM | POA: Insufficient documentation

## 2019-09-23 LAB — NOVEL CORONAVIRUS, NAA (HOSP ORDER, SEND-OUT TO REF LAB; TAT 18-24 HRS): SARS-CoV-2, NAA: NOT DETECTED

## 2019-09-25 MED ORDER — BUPIVACAINE LIPOSOME 1.3 % IJ SUSP
20.0000 mL | Freq: Once | INTRAMUSCULAR | Status: DC
  Filled 2019-09-25: qty 20

## 2019-09-26 ENCOUNTER — Observation Stay (HOSPITAL_COMMUNITY)
Admission: RE | Admit: 2019-09-26 | Discharge: 2019-09-27 | Disposition: A | Discharge status: Home / Self Care | Payer: Medicare HMO | Source: Other Acute Inpatient Hospital | Attending: Orthopedic Surgery | Admitting: Orthopedic Surgery

## 2019-09-26 ENCOUNTER — Encounter (HOSPITAL_COMMUNITY)
Admission: RE | Disposition: A | Discharge status: Home / Self Care | Payer: Self-pay | Source: Other Acute Inpatient Hospital | Attending: Orthopedic Surgery

## 2019-09-26 ENCOUNTER — Encounter (HOSPITAL_COMMUNITY): Payer: Self-pay

## 2019-09-26 ENCOUNTER — Other Ambulatory Visit: Payer: Self-pay

## 2019-09-26 ENCOUNTER — Ambulatory Visit (HOSPITAL_COMMUNITY): Payer: Medicare HMO | Admitting: Certified Registered"

## 2019-09-26 ENCOUNTER — Ambulatory Visit (HOSPITAL_COMMUNITY): Payer: Medicare HMO | Admitting: Physician Assistant

## 2019-09-26 DIAGNOSIS — I10 Essential (primary) hypertension: Secondary | ICD-10-CM | POA: Diagnosis not present

## 2019-09-26 DIAGNOSIS — Z79899 Other long term (current) drug therapy: Secondary | ICD-10-CM | POA: Diagnosis not present

## 2019-09-26 DIAGNOSIS — Z8249 Family history of ischemic heart disease and other diseases of the circulatory system: Secondary | ICD-10-CM | POA: Insufficient documentation

## 2019-09-26 DIAGNOSIS — F419 Anxiety disorder, unspecified: Secondary | ICD-10-CM | POA: Diagnosis not present

## 2019-09-26 DIAGNOSIS — M1712 Unilateral primary osteoarthritis, left knee: Principal | ICD-10-CM | POA: Insufficient documentation

## 2019-09-26 DIAGNOSIS — E119 Type 2 diabetes mellitus without complications: Secondary | ICD-10-CM

## 2019-09-26 DIAGNOSIS — Z7984 Long term (current) use of oral hypoglycemic drugs: Secondary | ICD-10-CM | POA: Insufficient documentation

## 2019-09-26 DIAGNOSIS — Z791 Long term (current) use of non-steroidal anti-inflammatories (NSAID): Secondary | ICD-10-CM | POA: Diagnosis not present

## 2019-09-26 HISTORY — DX: Unilateral primary osteoarthritis, left knee: M17.12

## 2019-09-26 HISTORY — PX: TOTAL KNEE ARTHROPLASTY: SHX125

## 2019-09-26 HISTORY — DX: Anxiety disorder, unspecified: F41.9

## 2019-09-26 LAB — HEMOGLOBIN A1C
Hgb A1c MFr Bld: 7.8 % — ABNORMAL HIGH (ref 4.8–5.6)
Mean Plasma Glucose: 177.16 mg/dL

## 2019-09-26 LAB — GLUCOSE, CAPILLARY
Glucose-Capillary: 168 mg/dL — ABNORMAL HIGH (ref 70–99)
Glucose-Capillary: 182 mg/dL — ABNORMAL HIGH (ref 70–99)
Glucose-Capillary: 291 mg/dL — ABNORMAL HIGH (ref 70–99)
Glucose-Capillary: 305 mg/dL — ABNORMAL HIGH (ref 70–99)
Glucose-Capillary: 311 mg/dL — ABNORMAL HIGH (ref 70–99)
Glucose-Capillary: 303 mg/dL — ABNORMAL HIGH (ref 70–99)

## 2019-09-26 SURGERY — ARTHROPLASTY, KNEE, TOTAL
Anesthesia: General | Laterality: Left

## 2019-09-26 MED ORDER — POLYETHYLENE GLYCOL 3350 17 G PO PACK
17.0000 g | PACK | Freq: Two times a day (BID) | ORAL | Status: DC
  Administered 2019-09-26 – 2019-09-27 (×2): 17 g via ORAL
  Filled 2019-09-26 (×2): qty 1

## 2019-09-26 MED ORDER — PHENYLEPHRINE HCL (PRESSORS) 10 MG/ML IV SOLN
INTRAVENOUS | Status: AC
  Filled 2019-09-26: qty 1

## 2019-09-26 MED ORDER — ONDANSETRON HCL 4 MG PO TABS: 4.0000 mg | ORAL_TABLET | Freq: Four times a day (QID) | ORAL | Status: DC | PRN

## 2019-09-26 MED ORDER — SUGAMMADEX SODIUM 200 MG/2ML IV SOLN
INTRAVENOUS | Status: DC | PRN
  Administered 2019-09-26: 200 mg via INTRAVENOUS

## 2019-09-26 MED ORDER — ROCURONIUM BROMIDE 10 MG/ML (PF) SYRINGE
PREFILLED_SYRINGE | INTRAVENOUS | Status: DC | PRN
  Administered 2019-09-26: 20 mg via INTRAVENOUS
  Administered 2019-09-26: 50 mg via INTRAVENOUS

## 2019-09-26 MED ORDER — CEFUROXIME SODIUM 1.5 G IV SOLR
INTRAVENOUS | Status: DC | PRN
  Administered 2019-09-26: 1.5 g via INTRAVENOUS

## 2019-09-26 MED ORDER — DEXAMETHASONE SODIUM PHOSPHATE 10 MG/ML IJ SOLN
10.0000 mg | Freq: Three times a day (TID) | INTRAMUSCULAR | Status: DC
  Administered 2019-09-26 – 2019-09-27 (×3): 10 mg via INTRAVENOUS
  Filled 2019-09-26 (×3): qty 1

## 2019-09-26 MED ORDER — CEFUROXIME SODIUM 1.5 G IV SOLR
INTRAVENOUS | Status: AC
  Filled 2019-09-26: qty 1.5

## 2019-09-26 MED ORDER — LABETALOL HCL 5 MG/ML IV SOLN
10.0000 mg | INTRAVENOUS | Status: DC | PRN
  Administered 2019-09-26: 5 mg via INTRAVENOUS

## 2019-09-26 MED ORDER — LIDOCAINE 2% (20 MG/ML) 5 ML SYRINGE
INTRAMUSCULAR | Status: DC | PRN
  Administered 2019-09-26: 40 mg via INTRAVENOUS

## 2019-09-26 MED ORDER — FENTANYL CITRATE (PF) 100 MCG/2ML IJ SOLN
INTRAMUSCULAR | Status: DC | PRN
  Administered 2019-09-26 (×6): 50 ug via INTRAVENOUS

## 2019-09-26 MED ORDER — DOCUSATE SODIUM 100 MG PO CAPS
100.0000 mg | ORAL_CAPSULE | Freq: Two times a day (BID) | ORAL | Status: DC
  Administered 2019-09-26 – 2019-09-27 (×2): 100 mg via ORAL
  Filled 2019-09-26 (×2): qty 1

## 2019-09-26 MED ORDER — VANCOMYCIN HCL IN DEXTROSE 1-5 GM/200ML-% IV SOLN
1000.0000 mg | INTRAVENOUS | Status: AC
  Administered 2019-09-26: 06:00:00 1000 mg via INTRAVENOUS

## 2019-09-26 MED ORDER — LACTATED RINGERS IV SOLN
INTRAVENOUS | Status: DC
  Administered 2019-09-26: 06:00:00 via INTRAVENOUS

## 2019-09-26 MED ORDER — ALUM & MAG HYDROXIDE-SIMETH 200-200-20 MG/5ML PO SUSP: 30.0000 mL | ORAL | Status: DC | PRN

## 2019-09-26 MED ORDER — DEXAMETHASONE SODIUM PHOSPHATE 10 MG/ML IJ SOLN
INTRAMUSCULAR | Status: AC
  Filled 2019-09-26: qty 1

## 2019-09-26 MED ORDER — FENTANYL CITRATE (PF) 100 MCG/2ML IJ SOLN
INTRAMUSCULAR | Status: AC
  Filled 2019-09-26: qty 2

## 2019-09-26 MED ORDER — TRANEXAMIC ACID-NACL 1000-0.7 MG/100ML-% IV SOLN
INTRAVENOUS | Status: AC
  Filled 2019-09-26: qty 100

## 2019-09-26 MED ORDER — DEXAMETHASONE SODIUM PHOSPHATE 10 MG/ML IJ SOLN
8.0000 mg | Freq: Once | INTRAMUSCULAR | Status: AC
  Administered 2019-09-26: 08:00:00 8 mg via INTRAVENOUS

## 2019-09-26 MED ORDER — MENTHOL 3 MG MT LOZG: 1.0000 | LOZENGE | OROMUCOSAL | Status: DC | PRN

## 2019-09-26 MED ORDER — ONDANSETRON HCL 4 MG/2ML IJ SOLN
INTRAMUSCULAR | Status: AC
  Filled 2019-09-26: qty 2

## 2019-09-26 MED ORDER — SODIUM CHLORIDE (PF) 0.9 % IJ SOLN
INTRAMUSCULAR | Status: AC
  Filled 2019-09-26: qty 50

## 2019-09-26 MED ORDER — VANCOMYCIN HCL IN DEXTROSE 1-5 GM/200ML-% IV SOLN
INTRAVENOUS | Status: AC
  Administered 2019-09-26: 06:00:00 1000 mg via INTRAVENOUS
  Filled 2019-09-26: qty 200

## 2019-09-26 MED ORDER — LABETALOL HCL 5 MG/ML IV SOLN
INTRAVENOUS | Status: AC
  Filled 2019-09-26: qty 4

## 2019-09-26 MED ORDER — OXYCODONE HCL 5 MG PO TABS
5.0000 mg | ORAL_TABLET | ORAL | Status: DC | PRN
  Administered 2019-09-26 – 2019-09-27 (×4): 10 mg via ORAL
  Filled 2019-09-26 (×4): qty 2

## 2019-09-26 MED ORDER — HYDROMORPHONE HCL 1 MG/ML IJ SOLN: 0.5000 mg | INTRAMUSCULAR | Status: DC | PRN

## 2019-09-26 MED ORDER — TRANEXAMIC ACID-NACL 1000-0.7 MG/100ML-% IV SOLN
1000.0000 mg | INTRAVENOUS | Status: AC
  Administered 2019-09-26: 1000 mg via INTRAVENOUS

## 2019-09-26 MED ORDER — DIPHENHYDRAMINE HCL 12.5 MG/5ML PO ELIX: 12.5000 mg | ORAL_SOLUTION | ORAL | Status: DC | PRN

## 2019-09-26 MED ORDER — LIDOCAINE 2% (20 MG/ML) 5 ML SYRINGE
INTRAMUSCULAR | Status: AC
  Filled 2019-09-26: qty 5

## 2019-09-26 MED ORDER — INSULIN ASPART 100 UNIT/ML ~~LOC~~ SOLN
4.0000 [IU] | Freq: Three times a day (TID) | SUBCUTANEOUS | Status: DC
  Administered 2019-09-26 – 2019-09-27 (×4): 4 [IU] via SUBCUTANEOUS

## 2019-09-26 MED ORDER — WATER FOR IRRIGATION, STERILE IR SOLN
Status: DC | PRN
  Administered 2019-09-26: 2000 mL

## 2019-09-26 MED ORDER — BUPIVACAINE LIPOSOME 1.3 % IJ SUSP
INTRAMUSCULAR | Status: DC | PRN
  Administered 2019-09-26: 20 mL

## 2019-09-26 MED ORDER — FENTANYL CITRATE (PF) 100 MCG/2ML IJ SOLN
25.0000 ug | INTRAMUSCULAR | Status: DC | PRN
  Administered 2019-09-26 (×3): 50 ug via INTRAVENOUS

## 2019-09-26 MED ORDER — POVIDONE-IODINE 10 % EX SWAB: 2.0000 "application " | Freq: Once | CUTANEOUS | Status: DC

## 2019-09-26 MED ORDER — MIDAZOLAM HCL 2 MG/2ML IJ SOLN
INTRAMUSCULAR | Status: AC
  Filled 2019-09-26: qty 2

## 2019-09-26 MED ORDER — MIDAZOLAM HCL 2 MG/2ML IJ SOLN
INTRAMUSCULAR | Status: DC | PRN
  Administered 2019-09-26: 2 mg via INTRAVENOUS

## 2019-09-26 MED ORDER — PHENYLEPHRINE 40 MCG/ML (10ML) SYRINGE FOR IV PUSH (FOR BLOOD PRESSURE SUPPORT)
PREFILLED_SYRINGE | INTRAVENOUS | Status: DC | PRN
  Administered 2019-09-26: 40 ug via INTRAVENOUS

## 2019-09-26 MED ORDER — PHENOL 1.4 % MT LIQD: 1.0000 | OROMUCOSAL | Status: DC | PRN

## 2019-09-26 MED ORDER — PROPOFOL 10 MG/ML IV BOLUS
INTRAVENOUS | Status: DC | PRN
  Administered 2019-09-26: 150 mg via INTRAVENOUS

## 2019-09-26 MED ORDER — ONDANSETRON HCL 4 MG/2ML IJ SOLN: 4.0000 mg | Freq: Once | INTRAMUSCULAR | Status: DC | PRN

## 2019-09-26 MED ORDER — INSULIN ASPART 100 UNIT/ML ~~LOC~~ SOLN
0.0000 [IU] | Freq: Three times a day (TID) | SUBCUTANEOUS | Status: DC
  Administered 2019-09-26: 15 [IU] via SUBCUTANEOUS
  Administered 2019-09-26 – 2019-09-27 (×3): 11 [IU] via SUBCUTANEOUS

## 2019-09-26 MED ORDER — METOCLOPRAMIDE HCL 5 MG/ML IJ SOLN: 5.0000 mg | Freq: Three times a day (TID) | INTRAMUSCULAR | Status: DC | PRN

## 2019-09-26 MED ORDER — POTASSIUM CHLORIDE IN NACL 20-0.9 MEQ/L-% IV SOLN
INTRAVENOUS | Status: DC
  Administered 2019-09-26: 12:00:00 via INTRAVENOUS
  Filled 2019-09-26 (×2): qty 1000

## 2019-09-26 MED ORDER — CHLORHEXIDINE GLUCONATE 4 % EX LIQD: 60.0000 mL | Freq: Once | CUTANEOUS | Status: DC

## 2019-09-26 MED ORDER — SODIUM CHLORIDE 0.9 % IV BOLUS
500.0000 mL | Freq: Once | INTRAVENOUS | Status: AC
  Administered 2019-09-26: 18:00:00 500 mL via INTRAVENOUS

## 2019-09-26 MED ORDER — ASPIRIN EC 325 MG PO TBEC
325.0000 mg | DELAYED_RELEASE_TABLET | Freq: Every day | ORAL | Status: DC
  Administered 2019-09-27: 325 mg via ORAL
  Filled 2019-09-26: qty 1

## 2019-09-26 MED ORDER — BUPIVACAINE HCL (PF) 0.25 % IJ SOLN
INTRAMUSCULAR | Status: DC | PRN
  Administered 2019-09-26: 30 mL via INTRA_ARTICULAR

## 2019-09-26 MED ORDER — POVIDONE-IODINE 10 % EX SWAB
2.0000 "application " | Freq: Once | CUTANEOUS | Status: AC
  Administered 2019-09-26: 2 via TOPICAL

## 2019-09-26 MED ORDER — INSULIN ASPART 100 UNIT/ML ~~LOC~~ SOLN
0.0000 [IU] | Freq: Every day | SUBCUTANEOUS | Status: DC
  Administered 2019-09-26: 23:00:00 4 [IU] via SUBCUTANEOUS

## 2019-09-26 MED ORDER — ONDANSETRON HCL 4 MG/2ML IJ SOLN
INTRAMUSCULAR | Status: DC | PRN
  Administered 2019-09-26: 4 mg via INTRAVENOUS

## 2019-09-26 MED ORDER — TIZANIDINE HCL 4 MG PO TABS: 4.0000 mg | ORAL_TABLET | Freq: Two times a day (BID) | ORAL | Status: DC | PRN

## 2019-09-26 MED ORDER — GABAPENTIN 300 MG PO CAPS
300.0000 mg | ORAL_CAPSULE | Freq: Every day | ORAL | Status: DC
  Administered 2019-09-26: 22:00:00 300 mg via ORAL
  Filled 2019-09-26: qty 1

## 2019-09-26 MED ORDER — METOCLOPRAMIDE HCL 5 MG PO TABS: 5.0000 mg | ORAL_TABLET | Freq: Three times a day (TID) | ORAL | Status: DC | PRN

## 2019-09-26 MED ORDER — BUPIVACAINE HCL (PF) 0.25 % IJ SOLN
INTRAMUSCULAR | Status: AC
  Filled 2019-09-26: qty 30

## 2019-09-26 MED ORDER — 0.9 % SODIUM CHLORIDE (POUR BTL) OPTIME
TOPICAL | Status: DC | PRN
  Administered 2019-09-26: 1000 mL

## 2019-09-26 MED ORDER — FENTANYL CITRATE (PF) 100 MCG/2ML IJ SOLN
INTRAMUSCULAR | Status: AC
  Filled 2019-09-26: qty 4

## 2019-09-26 MED ORDER — SODIUM CHLORIDE (PF) 0.9 % IJ SOLN
INTRAMUSCULAR | Status: DC | PRN
  Administered 2019-09-26: 50 mL

## 2019-09-26 MED ORDER — VANCOMYCIN HCL IN DEXTROSE 1-5 GM/200ML-% IV SOLN
1000.0000 mg | Freq: Two times a day (BID) | INTRAVENOUS | Status: AC
  Administered 2019-09-26: 19:00:00 1000 mg via INTRAVENOUS
  Filled 2019-09-26: qty 200

## 2019-09-26 MED ORDER — TRAZODONE HCL 100 MG PO TABS
100.0000 mg | ORAL_TABLET | Freq: Every day | ORAL | Status: DC
  Administered 2019-09-26: 22:00:00 100 mg via ORAL
  Filled 2019-09-26: qty 1

## 2019-09-26 MED ORDER — PHENYLEPHRINE 40 MCG/ML (10ML) SYRINGE FOR IV PUSH (FOR BLOOD PRESSURE SUPPORT)
PREFILLED_SYRINGE | INTRAVENOUS | Status: AC
  Filled 2019-09-26: qty 10

## 2019-09-26 MED ORDER — ONDANSETRON HCL 4 MG/2ML IJ SOLN: 4.0000 mg | Freq: Four times a day (QID) | INTRAMUSCULAR | Status: DC | PRN

## 2019-09-26 MED ORDER — POVIDONE-IODINE 7.5 % EX SOLN: Freq: Once | CUTANEOUS | Status: DC

## 2019-09-26 MED ORDER — ACETAMINOPHEN 500 MG PO TABS
1000.0000 mg | ORAL_TABLET | Freq: Four times a day (QID) | ORAL | Status: AC
  Administered 2019-09-26 – 2019-09-27 (×3): 1000 mg via ORAL
  Filled 2019-09-26 (×3): qty 2

## 2019-09-26 MED ORDER — PROPOFOL 10 MG/ML IV BOLUS
INTRAVENOUS | Status: AC
  Filled 2019-09-26: qty 20

## 2019-09-26 MED ORDER — SODIUM CHLORIDE 0.9 % IR SOLN
Status: DC | PRN
  Administered 2019-09-26: 2000 mL

## 2019-09-26 MED ORDER — INSULIN GLARGINE 100 UNIT/ML ~~LOC~~ SOLN
25.0000 [IU] | Freq: Every day | SUBCUTANEOUS | Status: DC
  Administered 2019-09-26: 23:00:00 25 [IU] via SUBCUTANEOUS
  Filled 2019-09-26 (×2): qty 0.25

## 2019-09-26 SURGICAL SUPPLY — 63 items
ATTUNE PSFEM LTSZ6 NARCEM KNEE (Femur) ×3 IMPLANT
ATTUNE PSRP INSR SZ6 7 KNEE (Insert) ×2 IMPLANT
ATTUNE PSRP INSR SZ6 7MM KNEE (Insert) ×1 IMPLANT
BAG ZIPLOCK 12X15 (MISCELLANEOUS) ×3 IMPLANT
BASE TIBIA ATTUNE KNEE SYS SZ6 (Knees) ×1 IMPLANT
BLADE HEX COATED 2.75 (ELECTRODE) ×3 IMPLANT
BLADE SAGITTAL 25.0X1.19X90 (BLADE) ×2 IMPLANT
BLADE SAGITTAL 25.0X1.19X90MM (BLADE) ×1
BLADE SAW SGTL 13X75X1.27 (BLADE) ×3 IMPLANT
BLADE SURG SZ10 CARB STEEL (BLADE) ×18 IMPLANT
BNDG ELASTIC 6X10 VLCR STRL LF (GAUZE/BANDAGES/DRESSINGS) ×3 IMPLANT
BOWL SMART MIX CTS (DISPOSABLE) ×3 IMPLANT
CEMENT HV SMART SET (Cement) ×6 IMPLANT
CHLORAPREP W/TINT 26 (MISCELLANEOUS) ×6 IMPLANT
CLOSURE WOUND 1/2 X4 (GAUZE/BANDAGES/DRESSINGS) ×1
COVER SURGICAL LIGHT HANDLE (MISCELLANEOUS) ×3 IMPLANT
COVER WAND RF STERILE (DRAPES) IMPLANT
CUFF TOURN SGL QUICK 34 (TOURNIQUET CUFF) ×2
CUFF TRNQT CYL 34X4.125X (TOURNIQUET CUFF) ×1 IMPLANT
DECANTER SPIKE VIAL GLASS SM (MISCELLANEOUS) ×6 IMPLANT
DRAPE ORTHO SPLIT 77X108 STRL (DRAPES) ×2
DRAPE SHEET LG 3/4 BI-LAMINATE (DRAPES) ×3 IMPLANT
DRAPE SURG ORHT 6 SPLT 77X108 (DRAPES) ×1 IMPLANT
DRAPE U-SHAPE 47X51 STRL (DRAPES) ×3 IMPLANT
DRSG AQUACEL AG ADV 3.5X10 (GAUZE/BANDAGES/DRESSINGS) ×3 IMPLANT
ELECT REM PT RETURN 15FT ADLT (MISCELLANEOUS) ×3 IMPLANT
GLOVE BIO SURGEON STRL SZ7 (GLOVE) ×3 IMPLANT
GLOVE BIOGEL PI IND STRL 7.0 (GLOVE) ×1 IMPLANT
GLOVE BIOGEL PI IND STRL 7.5 (GLOVE) ×1 IMPLANT
GLOVE BIOGEL PI INDICATOR 7.0 (GLOVE) ×2
GLOVE BIOGEL PI INDICATOR 7.5 (GLOVE) ×2
GLOVE SS BIOGEL STRL SZ 7.5 (GLOVE) ×1 IMPLANT
GLOVE SUPERSENSE BIOGEL SZ 7.5 (GLOVE) ×2
GOWN STRL REUS W/ TWL LRG LVL3 (GOWN DISPOSABLE) ×2 IMPLANT
GOWN STRL REUS W/TWL LRG LVL3 (GOWN DISPOSABLE) ×4
HANDPIECE INTERPULSE COAX TIP (DISPOSABLE) ×2
HOLDER FOLEY CATH W/STRAP (MISCELLANEOUS) ×3 IMPLANT
HOOD PEEL AWAY FLYTE STAYCOOL (MISCELLANEOUS) ×9 IMPLANT
KIT TURNOVER KIT A (KITS) ×3 IMPLANT
MANIFOLD NEPTUNE II (INSTRUMENTS) ×3 IMPLANT
MARKER SKIN DUAL TIP RULER LAB (MISCELLANEOUS) ×3 IMPLANT
NEEDLE HYPO 22GX1.5 SAFETY (NEEDLE) ×3 IMPLANT
NS IRRIG 1000ML POUR BTL (IV SOLUTION) ×3 IMPLANT
PACK TOTAL KNEE CUSTOM (KITS) ×3 IMPLANT
PATELLA MEDIAL ATTUN 35MM KNEE (Knees) ×3 IMPLANT
PIN STEINMAN FIXATION KNEE (PIN) ×3 IMPLANT
PIN THREADED HEADED SIGMA (PIN) ×3 IMPLANT
PROTECTOR NERVE ULNAR (MISCELLANEOUS) ×3 IMPLANT
SET HNDPC FAN SPRY TIP SCT (DISPOSABLE) ×1 IMPLANT
SLEEVE SURGEON STRL (DRAPES) ×3 IMPLANT
STAPLER VISISTAT 35W (STAPLE) IMPLANT
STRIP CLOSURE SKIN 1/2X4 (GAUZE/BANDAGES/DRESSINGS) ×2 IMPLANT
SUT MNCRL AB 3-0 PS2 18 (SUTURE) ×3 IMPLANT
SUT VIC AB 0 CT1 36 (SUTURE) ×6 IMPLANT
SUT VIC AB 1 CT1 36 (SUTURE) ×3 IMPLANT
SUT VIC AB 2-0 CT1 27 (SUTURE) ×4
SUT VIC AB 2-0 CT1 TAPERPNT 27 (SUTURE) ×2 IMPLANT
SYR CONTROL 10ML LL (SYRINGE) ×6 IMPLANT
TIBIA ATTUNE KNEE SYS BASE SZ6 (Knees) ×3 IMPLANT
TRAY FOLEY MTR SLVR 14FR STAT (SET/KITS/TRAYS/PACK) ×3 IMPLANT
WATER STERILE IRR 1000ML POUR (IV SOLUTION) ×6 IMPLANT
YANKAUER SUCT BULB TIP 10FT TU (MISCELLANEOUS) ×3 IMPLANT
YANKAUER SUCT BULB TIP NO VENT (SUCTIONS) ×3 IMPLANT

## 2019-09-26 NOTE — Anesthesia Preprocedure Evaluation (Addendum)
Anesthesia Evaluation  Patient identified by MRN, date of birth, ID band Patient awake    Reviewed: Allergy & Precautions, NPO status , Patient's Chart, lab work & pertinent test results  Airway Mallampati: II  TM Distance: >3 FB Neck ROM: Full    Dental  (+) Teeth Intact, Dental Advisory Given   Pulmonary former smoker,    breath sounds clear to auscultation       Cardiovascular hypertension,  Rhythm:Regular Rate:Normal     Neuro/Psych    GI/Hepatic   Endo/Other  diabetes  Renal/GU      Musculoskeletal   Abdominal   Peds  Hematology   Anesthesia Other Findings   Reproductive/Obstetrics                             Anesthesia Physical Anesthesia Plan  ASA: III  Anesthesia Plan: General   Post-op Pain Management:  Regional for Post-op pain   Induction:   PONV Risk Score and Plan: Ondansetron and Dexamethasone  Airway Management Planned: Oral ETT  Additional Equipment:   Intra-op Plan:   Post-operative Plan: Extubation in OR  Informed Consent: I have reviewed the patients History and Physical, chart, labs and discussed the procedure including the risks, benefits and alternatives for the proposed anesthesia with the patient or authorized representative who has indicated his/her understanding and acceptance.     Dental advisory given  Plan Discussed with: CRNA and Anesthesiologist  Anesthesia Plan Comments:         Anesthesia Quick Evaluation

## 2019-09-26 NOTE — Plan of Care (Signed)
Continue current POC 

## 2019-09-26 NOTE — Interval H&P Note (Signed)
History and Physical Interval Note:  09/26/2019 7:06 AM  Tiffany Robles  has presented today for surgery, with the diagnosis of djd left knee.  The various methods of treatment have been discussed with the patient and family. After consideration of risks, benefits and other options for treatment, the patient has consented to  Procedure(s): TOTAL KNEE ARTHROPLASTY (Left) as a surgical intervention.  The patient's history has been reviewed, patient examined, no change in status, stable for surgery.  I have reviewed the patient's chart and labs.  Questions were answered to the patient's satisfaction.     Lorn Junes

## 2019-09-26 NOTE — Transfer of Care (Signed)
Immediate Anesthesia Transfer of Care Note  Patient: Tiffany Robles  Procedure(s) Performed: TOTAL KNEE ARTHROPLASTY (Left )  Patient Location: PACU  Anesthesia Type:General  Level of Consciousness: awake, alert  and oriented  Airway & Oxygen Therapy: Patient Spontanous Breathing and Patient connected to face mask oxygen  Post-op Assessment: Report given to RN and Post -op Vital signs reviewed and stable  Post vital signs: Reviewed and stable  Last Vitals:  Vitals Value Taken Time  BP    Temp    Pulse 93 09/26/19 0924  Resp 16 09/26/19 0924  SpO2 91 % 09/26/19 0924  Vitals shown include unvalidated device data.  Last Pain:  Vitals:   09/26/19 0630  TempSrc: Oral  PainSc:          Complications: No apparent anesthesia complications

## 2019-09-26 NOTE — Op Note (Signed)
MRN:     [id number] DOB/AGE:    1956/09/15 / 63 y.o.       OPERATIVE REPORT   DATE OF PROCEDURE:  09/26/2019      PREOPERATIVE DIAGNOSIS:   Primary Localized Osteoarthritis left Knee       Estimated body mass index is 33.32 kg/m as calculated from the following:   Height as of this encounter: 5\' 2"  (1.575 m).   Weight as of this encounter: 82.6 kg.                                                       POSTOPERATIVE DIAGNOSIS:   Same                                                                 PROCEDURE:  Procedure(s): TOTAL KNEE ARTHROPLASTY Using Depuy Attune RP implants #6 narrow Femur, #6Tibia, 2mm  RP bearing, 35 Patella    SURGEON: Leyton Brownlee A. Noemi Chapel, MD   ASSISTANT: Matthew Saras, PA-C, present and scrubbed throughout the case, critical for retraction, instrumentation, and closure.  ANESTHESIA: Spinal with Adductor Nerve Block  TOURNIQUET TIME: 50 minutes   COMPLICATIONS:  None       SPECIMENS: None   INDICATIONS FOR PROCEDURE: The patient has djd of the knee with varus deformities, XR shows bone on bone arthritis. Patient has failed all conservative measures including anti-inflammatory medicines, narcotics, attempts at exercise and weight loss, cortisone injections and viscosupplementation.  Risks and benefits of surgery have been discussed, questions answered.    DESCRIPTION OF PROCEDURE: The patient identified by armband, received right adductor canal block and IV antibiotics, in the holding area at [facility]. Patient taken to the operating room, appropriate anesthetic monitors were attached. Spinal anesthesia induced with the patient in supine position, Foley catheter was inserted. Tourniquet applied high to the operative thigh. Lateral post and foot positioner applied to the table, the lower extremity was then prepped and draped in usual sterile fashion from the ankle to the tourniquet. Time-out procedure was performed. The limb was wrapped with an Esmarch  bandage and the tourniquet inflated to 365 mmHg.   We began the operation by making a 6cm anterior midline incision. Small bleeders in the skin and the subcutaneous tissue identified and cauterized. Transverse retinaculum was incised and reflected medially and a medial parapatellar arthrotomy was accomplished. the patella was everted and theprepatellar fat pad resected. The superficial medial collateral ligament was then elevated from anterior to posterior along the proximal flare of the tibia and anterior half of the menisci resected. The knee was hyperflexed exposing bone on bone arthritis. Peripheral and notch osteophytes as well as the cruciate ligaments were then resected. We continued to work our way around posteriorly along the proximal tibia, and externally rotated the tibia subluxing it out from underneath the femur. A McHale retractor was placed through the notch and a lateral Hohmann retractor placed, and an external tibial guide was placed.  The tibial cutting guide was pinned into place allowing resection of 4 mm of bone medially and about 6 mm of bone laterally because of  her varus deformity.   Satisfied with the tibial resection, we then entered the distal femur 2 mm anterior to the PCL origin with the intramedullary guide rod and applied the distal femoral cutting guide set at 28mm, with 5 degrees of valgus. This was pinned along the epicondylar axis. At this point, the distal femoral cut was accomplished without difficulty. We then sized for a 6 narrow femoral component and pinned the guide in 3 degrees of external rotation.The chamfer cutting guide was pinned into place. The anterior, posterior, and chamfer cuts were accomplished without difficulty followed by the  RP box cutting guide and the box cut. We also removed posterior osteophytes from the posterior femoral condyles. At this time, the knee was brought into full extension. We checked our extension and flexion gaps and found them  symmetric at 7.  The patella thickness measured at 47m m. We set the cutting guide at 15 and removed the posterior patella sized for 35 button and drilled the lollipop. The knee was then once again hyperflexed exposing the proximal tibia. We sized for a # 6 tibial base plate, applied the smokestack and the conical reamer followed by the the Delta fin keel punch. We then hammered into place the  RP trial femoral component, inserted a trial bearing, trial patellar button, and took the knee through range of motion from 0-130 degrees. No thumb pressure was required for patellar tracking.   At this point, all trial components were removed, a double batch of DePuy HV cement  with Zinacef 1.5gms was mixed and applied to all bony metallic mating surfaces. In order, we hammered into place the tibial tray and removed excess cement, the femoral component and removed excess cement, a 7 mm  RP bearing was inserted, and the knee brought to full extension with compression. The patellar button was clamped into place, and excess cement removed. While the cement cured the wound was irrigated out with normal saline solution pulse lavage, and exparel was injected throughout the knee. Ligament stability and patellar tracking were checked and found to be excellent..   The parapatellar arthrotomy was closed with  #1 Vicryl suture. The subcutaneous tissue with 0 and 2-0 undyed Vicryl suture, and 4-0 Monocryl.. A dressing of Aquaseal, 4 x 4, dressing sponges, Webril, and Ace wrap applied. Needle and sponge count were correct times 2.The patient awakened, extubated, and taken to recovery room without difficulty. Vascular status was normal, pulses 2+ and symmetric.    Lorn Junes 02/22/2018, 8:56 AM

## 2019-09-26 NOTE — Anesthesia Procedure Notes (Signed)
Anesthesia Regional Block: Adductor canal block   Pre-Anesthetic Checklist: ,, timeout performed, Correct Patient, Correct Site, Correct Laterality, Correct Procedure, Correct Position, site marked, Risks and benefits discussed, pre-op evaluation,  At surgeon's request and post-op pain management  Laterality: Left  Prep: Maximum Sterile Barrier Precautions used, chloraprep       Needles:  Injection technique: Single-shot  Needle Type: Echogenic Stimulator Needle     Needle Length: 9cm  Needle Gauge: 21     Additional Needles:   Procedures:,,,, ultrasound used (permanent image in chart),,,,  Narrative:  Start time: 09/26/2019 6:50 AM End time: 09/26/2019 7:00 AM Injection made incrementally with aspirations every 5 mL.  Performed by: Personally  Anesthesiologist: Roberts Gaudy, MD  Additional Notes: 20 cc 0.75% Ropivacaine injected easily

## 2019-09-26 NOTE — Anesthesia Postprocedure Evaluation (Signed)
Anesthesia Post Note  Patient: Tiffany Robles  Procedure(s) Performed: TOTAL KNEE ARTHROPLASTY (Left )     Patient location during evaluation: PACU Anesthesia Type: General Level of consciousness: awake and alert Pain management: pain level controlled Vital Signs Assessment: post-procedure vital signs reviewed and stable Respiratory status: spontaneous breathing, nonlabored ventilation, respiratory function stable and patient connected to nasal cannula oxygen Cardiovascular status: blood pressure returned to baseline and stable Postop Assessment: no apparent nausea or vomiting Anesthetic complications: no    Last Vitals:  Vitals:   09/26/19 1227 09/26/19 1350  BP: (!) 156/67 (!) 153/126  Pulse: 99 99  Resp: 18 18  Temp: 36.6 C 36.4 C  SpO2: 91% 95%    Last Pain:  Vitals:   09/26/19 1350  TempSrc: Oral  PainSc:                  [name]

## 2019-09-26 NOTE — Anesthesia Procedure Notes (Signed)
Procedure Name: Intubation Date/Time: 09/26/2019 7:25 AM Performed by: Eben Burow, CRNA Pre-anesthesia Checklist: Patient identified, Emergency Drugs available, Suction available, Patient being monitored and Timeout performed Patient Re-evaluated:Patient Re-evaluated prior to induction Oxygen Delivery Method: Circle system utilized Preoxygenation: Pre-oxygenation with 100% oxygen Induction Type: IV induction Ventilation: Mask ventilation without difficulty Laryngoscope Size: Mac and 4 Grade View: Grade I Tube type: Oral Tube size: 7.0 mm Number of attempts: 1 Airway Equipment and Method: Stylet Placement Confirmation: ETT inserted through vocal cords under direct vision,  positive ETCO2 and breath sounds checked- equal and bilateral Secured at: 21 cm Tube secured with: Tape Dental Injury: Teeth and Oropharynx as per pre-operative assessment

## 2019-09-26 NOTE — Evaluation (Signed)
Physical Therapy Evaluation Patient Details Name: Tiffany Robles MRN: [id number] DOB: 09/14/1956 Today's Date: 09/26/2019   History of Present Illness  s/p L TKA  Clinical Impression  Pt is s/p TKA resulting in the deficits listed below (see PT Problem List).  Pt amb 69' with Rw and min/guard assist. Anticipate steady progress.  Pt will benefit from skilled PT to increase their independence and safety with mobility to allow discharge to the venue listed below.      Follow Up Recommendations Follow surgeon's recommendation for DC plan and follow-up therapies    Equipment Recommendations  3in1 (PT);Rolling walker with 5" wheels    Recommendations for Other Services       Precautions / Restrictions Precautions Precautions: Knee Restrictions Weight Bearing Restrictions: No      Mobility  Bed Mobility Overal bed mobility: Needs Assistance Bed Mobility: Supine to Sit     Supine to sit: Min guard     General bed mobility comments: for safety  Transfers Overall transfer level: Needs assistance Equipment used: Rolling walker (2 wheeled) Transfers: Sit to/from Stand Sit to Stand: Min assist         General transfer comment: cues for hand placement  Ambulation/Gait Ambulation/Gait assistance: Min assist;Min guard Gait Distance (Feet): 55 Feet   Gait Pattern/deviations: Step-to pattern;Decreased stance time - left;Decreased weight shift to left     General Gait Details: cues for sequence and RW position from self  Stairs            Wheelchair Mobility    Modified Rankin (Stroke Patients Only)       Balance                                             Pertinent Vitals/Pain Pain Assessment: 0-10 Pain Score: 1  Pain Location: L knee Pain Descriptors / Indicators: Grimacing;Guarding Pain Intervention(s): Limited activity within patient's tolerance;Monitored during session;Premedicated before session;Repositioned    Home Living  Family/patient expects to be discharged to:: Private residence Living Arrangements: Spouse/significant other Available Help at Discharge: Family Type of Home: House Home Access: Stairs to enter   Technical brewer of Steps: 2 Home Layout: One level Home Equipment: None      Prior Function Level of Independence: Independent               Hand Dominance        Extremity/Trunk Assessment   Upper Extremity Assessment Upper Extremity Assessment: Overall WFL for tasks assessed    Lower Extremity Assessment Lower Extremity Assessment: LLE deficits/detail LLE Deficits / Details: ankle WFL, knee and hip grossly 3/5       Communication      Cognition Arousal/Alertness: Awake/alert Behavior During Therapy: WFL for tasks assessed/performed Overall Cognitive Status: Within Functional Limits for tasks assessed                                        General Comments      Exercises Total Joint Exercises Ankle Circles/Pumps: AROM;Both;10 reps Quad Sets: AROM;5 reps;Both   Assessment/Plan    PT Assessment Patient needs continued PT services  PT Problem List Decreased strength;Decreased range of motion;Decreased activity tolerance;Decreased mobility;Decreased knowledge of use of DME;Pain       PT Treatment Interventions DME instruction;Gait training;Functional mobility training;Therapeutic  activities;Patient/family education;Therapeutic exercise;Stair training    PT Goals (Current goals can be found in the Care Plan section)  Acute Rehab PT Goals PT Goal Formulation: With patient Time For Goal Achievement: 10/03/19 Potential to Achieve Goals: Good    Frequency 7X/week   Barriers to discharge        Co-evaluation               AM-PAC PT "6 Clicks" Mobility  Outcome Measure Help needed turning from your back to your side while in a flat bed without using bedrails?: A Little Help needed moving from lying on your back to sitting on the  side of a flat bed without using bedrails?: A Little Help needed moving to and from a bed to a chair (including a wheelchair)?: A Little Help needed standing up from a chair using your arms (e.g., wheelchair or bedside chair)?: A Little Help needed to walk in hospital room?: A Little Help needed climbing 3-5 steps with a railing? : A Lot 6 Click Score: 17    End of Session Equipment Utilized During Treatment: Gait belt Activity Tolerance: Patient tolerated treatment well Patient left: in chair;with call bell/phone within reach;with chair alarm set   PT Visit Diagnosis: Difficulty in walking, not elsewhere classified (R26.2)    Time: [id number] PT Time Calculation (min) (ACUTE ONLY): 27 min   Charges:   PT Evaluation $PT Eval Low Complexity: 1 Low PT Treatments $Gait Training: 8-22 mins        Kenyon Ana, PT  Pager: [phone] Acute Rehab Dept [facility]): [phone]   09/26/2019   [facility] 09/26/2019, 1:47 PM

## 2019-09-27 ENCOUNTER — Encounter (HOSPITAL_COMMUNITY): Payer: Self-pay | Admitting: Orthopedic Surgery

## 2019-09-27 DIAGNOSIS — M1712 Unilateral primary osteoarthritis, left knee: Secondary | ICD-10-CM | POA: Diagnosis not present

## 2019-09-27 LAB — CBC
HCT: 30.1 % — ABNORMAL LOW (ref 36.0–46.0)
Hemoglobin: 9.7 g/dL — ABNORMAL LOW (ref 12.0–15.0)
MCH: 30.4 pg (ref 26.0–34.0)
MCHC: 32.2 g/dL (ref 30.0–36.0)
MCV: 94.4 fL (ref 80.0–100.0)
Platelets: 216 10*3/uL (ref 150–400)
RBC: 3.19 MIL/uL — ABNORMAL LOW (ref 3.87–5.11)
RDW: 13.1 % (ref 11.5–15.5)
WBC: 13.9 10*3/uL — ABNORMAL HIGH (ref 4.0–10.5)
nRBC: 0 % (ref 0.0–0.2)

## 2019-09-27 LAB — BASIC METABOLIC PANEL
Anion gap: 8 (ref 5–15)
BUN: 24 mg/dL — ABNORMAL HIGH (ref 8–23)
CO2: 23 mmol/L (ref 22–32)
Calcium: 8.3 mg/dL — ABNORMAL LOW (ref 8.9–10.3)
Chloride: 105 mmol/L (ref 98–111)
Creatinine, Ser: 0.95 mg/dL (ref 0.44–1.00)
GFR calc Af Amer: >60 mL/min (ref >60)
GFR calc non Af Amer: >60 mL/min (ref >60)
Glucose, Bld: 251 mg/dL — ABNORMAL HIGH (ref 70–99)
Potassium: 4.6 mmol/L (ref 3.5–5.1)
Sodium: 136 mmol/L (ref 135–145)

## 2019-09-27 LAB — URINE CULTURE: Culture: NO GROWTH

## 2019-09-27 LAB — GLUCOSE, CAPILLARY
Glucose-Capillary: 295 mg/dL — ABNORMAL HIGH (ref 70–99)
Glucose-Capillary: 292 mg/dL — ABNORMAL HIGH (ref 70–99)

## 2019-09-27 MED ORDER — GABAPENTIN 300 MG PO CAPS: ORAL_CAPSULE | ORAL | 0 refills | Status: DC

## 2019-09-27 MED ORDER — POLYETHYLENE GLYCOL 3350 17 G PO PACK: PACK | ORAL | 0 refills | Status: DC

## 2019-09-27 MED ORDER — OXYCODONE HCL 5 MG PO TABS: ORAL_TABLET | ORAL | 0 refills | Status: DC

## 2019-09-27 MED ORDER — SODIUM CHLORIDE 0.9 % IV BOLUS
500.0000 mL | Freq: Once | INTRAVENOUS | Status: AC
  Administered 2019-09-27: 500 mL via INTRAVENOUS

## 2019-09-27 MED ORDER — ASPIRIN 325 MG PO TBEC: DELAYED_RELEASE_TABLET | ORAL | 0 refills | Status: DC

## 2019-09-27 MED ORDER — DOCUSATE SODIUM 100 MG PO CAPS: ORAL_CAPSULE | ORAL | 0 refills | Status: DC

## 2019-09-27 NOTE — Progress Notes (Signed)
   09/27/19 1300  PT Visit Information  Last PT Received On 09/27/19--pt progressing well. Reviewed HEP, pt tol well. Ready fo rd/c from PT standpoint  Assistance Needed +1  History of Present Illness s/p L TKA  Precautions  Precautions Knee  Restrictions  Weight Bearing Restrictions No  Pain Assessment  Pain Assessment 0-10  Pain Score 6  Pain Location L knee  Pain Descriptors / Indicators Grimacing;Guarding  Pain Intervention(s) Limited activity within patient's tolerance;Monitored during session;Premedicated before session;Ice applied  Cognition  Arousal/Alertness Awake/alert  Behavior During Therapy WFL for tasks assessed/performed  Overall Cognitive Status Within Functional Limits for tasks assessed  Bed Mobility  General bed mobility comments OOB in chair   Total Joint Exercises  Ankle Circles/Pumps AROM;Both;10 reps  Quad Sets AROM;Both;10 reps  Heel Slides AROM;10 reps;Left;AAROM  Hip ABduction/ADduction AROM;Left;10 reps  Straight Leg Raises AROM;AAROM;10 reps;Left  Goniometric ROM grossly 3 to 65 degrees flexion L knee  PT - End of Session  Equipment Utilized During Treatment Gait belt  Activity Tolerance Patient tolerated treatment well  Patient left in chair;with call bell/phone within reach;with family/visitor present   PT - Assessment/Plan  PT Plan Current plan remains appropriate  PT Visit Diagnosis Difficulty in walking, not elsewhere classified (R26.2)  PT Frequency (ACUTE ONLY) 7X/week  Follow Up Recommendations Follow surgeon's recommendation for DC plan and follow-up therapies  PT equipment 3in1 (PT);Rolling walker with 5" wheels  AM-PAC PT "6 Clicks" Mobility Outcome Measure (Version 2)  Help needed turning from your back to your side while in a flat bed without using bedrails? 3  Help needed moving from lying on your back to sitting on the side of a flat bed without using bedrails? 3  Help needed moving to and from a bed to a chair (including a  wheelchair)? 3  Help needed standing up from a chair using your arms (e.g., wheelchair or bedside chair)? 3  Help needed to walk in hospital room? 3  Help needed climbing 3-5 steps with a railing?  3  6 Click Score 18  Consider Recommendation of Discharge To: Home with [facility]  PT Goal Progression  Progress towards PT goals Progressing toward goals  Acute Rehab PT Goals  PT Goal Formulation With patient  Time For Goal Achievement 10/03/19  Potential to Achieve Goals Good  PT Time Calculation  PT Start Time (ACUTE ONLY) 1240  PT Stop Time (ACUTE ONLY) 1254  PT Time Calculation (min) (ACUTE ONLY) 14 min  PT General Charges  $$ ACUTE PT VISIT 1 Visit  PT Treatments  $Therapeutic Exercise 8-22 mins

## 2019-09-27 NOTE — Discharge Summary (Signed)
Patient ID: Tiffany Robles MRN: [id number] DOB/AGE: November 09, 1956 63 y.o.  Admit date: 09/26/2019 Discharge date: 09/27/2019  Admission Diagnoses:  Principal Problem:   Primary localized osteoarthritis of left knee Active Problems:   Hypertension   Diabetes mellitus without complication [facility])   Anxiety   Discharge Diagnoses:  Same  Past Medical History:  Diagnosis Date  . Anxiety   . Arthritis   . Diabetes mellitus without complication ([place])    type 2   . Family history of adverse reaction to anesthesia    mother allergic to Anesthesia drug- had a very bad headache  . Hypertension   . Primary localized osteoarthritis of left knee   . Syncope and collapse    first occurrence 40 years ago , last occurrence 10 years ago , saw cardio for eval , underwent echo and per patient results were normal and was told to f/u with cardio if syncope reoccurs, patient reports no reoccurence of syncope since that time .    Surgeries: Procedure(s): TOTAL KNEE ARTHROPLASTY on 09/26/2019   Consultants:   Discharged Condition: Improved  Hospital Course: Tiffany Robles is an 63 y.o. female who was admitted 09/26/2019 for operative treatment ofPrimary localized osteoarthritis of left knee. Patient has severe unremitting pain that affects sleep, daily activities, and work/hobbies. After pre-op clearance the patient was taken to the operating room on 09/26/2019 and underwent  Procedure(s): TOTAL KNEE ARTHROPLASTY.    Patient was given perioperative antibiotics:  Anti-infectives (From admission, onward)   Start     Dose/Rate Route Frequency Ordered Stop   09/26/19 1800  vancomycin (VANCOCIN) IVPB 1000 mg/200 mL premix     1,000 mg 200 mL/hr over 60 Minutes Intravenous Every 12 hours 09/26/19 1047 09/26/19 1948   09/26/19 0832  cefUROXime (ZINACEF) injection  Status:  Discontinued       As needed 09/26/19 0833 09/26/19 1045   09/26/19 0600  vancomycin (VANCOCIN) IVPB 1000 mg/200 mL premix      1,000 mg 200 mL/hr over 60 Minutes Intravenous On call to O.R. 09/26/19 [id number] 09/26/19 0724       Patient was given sequential compression devices, early ambulation, and chemoprophylaxis to prevent DVT.  Patient benefited maximally from hospital stay and there were no complications.    Recent vital signs:  Patient Vitals for the past 24 hrs:  BP Temp Temp src Pulse Resp SpO2  09/27/19 0630 134/63 98 F (36.7 C) Oral 93 18 96 %  09/27/19 0051 (!) 118/92 98.3 F (36.8 C) Oral 91 14 97 %  09/26/19 2055 (!) 148/68 98.2 F (36.8 C) Oral 89 16 98 %  09/26/19 1745 139/71 97.7 F (36.5 C) Oral 87 18 97 %  09/26/19 1606 139/71 - - 96 - 97 %  09/26/19 1350 (!) 153/126 97.6 F (36.4 C) Oral 99 18 95 %  09/26/19 1227 (!) 156/67 97.8 F (36.6 C) Oral 99 18 91 %  09/26/19 1134 139/70 98.5 F (36.9 C) - 94 14 93 %  09/26/19 1040 (!) 149/76 99.1 F (37.3 C) Oral 93 18 94 %  09/26/19 1015 (!) 167/79 98.7 F (37.1 C) - 93 11 94 %  09/26/19 1000 (!) 185/173 - - (!) 101 15 94 %  09/26/19 0945 (!) 189/90 98.7 F (37.1 C) - 98 14 95 %  09/26/19 0930 (!) 189/90 - - 96 19 98 %  09/26/19 0924 (!) 179/86 97.9 F (36.6 C) - 96 15 98 %     Recent laboratory studies:  Recent  Labs    09/27/19 0226  WBC 13.9*  HGB 9.7*  HCT 30.1*  PLT 216  NA 136  K 4.6  CL 105  CO2 23  BUN 24*  CREATININE 0.95  GLUCOSE 251*  CALCIUM 8.3*     Discharge Medications:   Allergies as of 09/27/2019      Reactions   Augmentin [amoxicillin-pot Clavulanate] Nausea And Vomiting   Codeine Nausea And Vomiting   Ivp Dye [iodinated Diagnostic Agents] Hives, Itching   Just contrast dye , no reactions to other -dines    Penicillins Swelling   Facial swelling Did it involve swelling of the face/tongue/throat, SOB, or low BP? Yes Did it involve sudden or severe rash/hives, skin peeling, or any reaction on the inside of your mouth or nose? No Did you need to seek medical attention at a hospital or doctor's office?  Yes When did it last happen?10+ years If all above answers are "NO", may proceed with cephalosporin use.      Medication List    STOP taking these medications   ibuprofen 800 MG tablet Commonly known as: ADVIL   lisinopril-hydrochlorothiazide 20-25 MG tablet Commonly known as: ZESTORETIC   pregabalin 75 MG capsule Commonly known as: LYRICA     TAKE these medications   aspirin 325 MG EC tablet 1 tab a day for the next 30 days to prevent blood clots   atorvastatin 40 MG tablet Commonly known as: LIPITOR Take 40 mg by mouth daily.   CALCIUM 600 + D PO Take 1 tablet by mouth 2 (two) times daily.   docusate sodium 100 MG capsule Commonly known as: COLACE 1 tab 2 times a day while on narcotics.  STOOL SOFTENER   gabapentin 300 MG capsule Commonly known as: NEURONTIN 1 po qhs for nerve pain   glipiZIDE 5 MG tablet Commonly known as: GLUCOTROL Take 5 mg by mouth 2 (two) times daily.   metFORMIN 1000 MG tablet Commonly known as: GLUCOPHAGE Take 1,000 mg by mouth 2 (two) times daily with a meal.   multivitamin with minerals Tabs tablet Take 1 tablet by mouth 2 (two) times daily.   oxyCODONE 5 MG immediate release tablet Commonly known as: Oxy IR/ROXICODONE 1 po q 4 hrs prn pain.  Left total knee replacement 09/26/2019   PARoxetine 30 MG tablet Commonly known as: PAXIL Take 60 mg by mouth daily.   polyethylene glycol 17 g packet Commonly known as: MIRALAX / GLYCOLAX 17grams in 6 oz of something to drink twice a day until bowel movement.  LAXITIVE.  Restart if two days since last bowel movement   tiZANidine 4 MG tablet Commonly known as: ZANAFLEX Take 4 mg by mouth 2 (two) times daily as needed for muscle spasms.   traZODone 100 MG tablet Commonly known as: DESYREL Take 100 mg by mouth at bedtime.   Trulicity 1.5 0000000 Sopn Generic drug: Dulaglutide Inject 3 mg into the skin every Friday.            Discharge Care Instructions  (From  admission, onward)         Start     Ordered   09/27/19 0000  Change dressing    Comments: DO NOT REMOVE BANDAGE OVER SURGICAL INCISION.  [place] WHOLE LEG INCLUDING OVER THE WATERPROOF BANDAGE WITH SOAP AND WATER EVERY DAY.   09/27/19 0842          Diagnostic Studies: No results found.  Disposition: Discharge disposition: 01-Home or Self Care  Discharge Instructions    CPM   Complete by: As directed    Continuous passive motion machine (CPM):      Use the CPM from 0 to 90 for 6 hours per day.       You may break it up into 2 or 3 sessions per day.      Use CPM for 2 weeks or until you are told to stop.   Call MD / Call 911   Complete by: As directed    If you experience chest pain or shortness of breath, CALL 911 and be transported to the hospital emergency room.  If you develope a fever above 101 F, pus (white drainage) or increased drainage or redness at the wound, or calf pain, call your surgeon's office.   Change dressing   Complete by: As directed    DO NOT REMOVE BANDAGE OVER SURGICAL INCISION.  [place] WHOLE LEG INCLUDING OVER THE WATERPROOF BANDAGE WITH SOAP AND WATER EVERY DAY.   Constipation Prevention   Complete by: As directed    Drink plenty of fluids.  Prune juice may be helpful.  You may use a stool softener, such as Colace (over the counter) 100 mg twice a day.  Use MiraLax (over the counter) for constipation as needed.   Diet - low sodium heart healthy   Complete by: As directed    Discharge instructions   Complete by: As directed    INSTRUCTIONS AFTER JOINT REPLACEMENT   DO NOT TAKE BLOOD PRESSURE MEDICINE UNTIL BLOOD PRESSURE IS 140/90 OR HIGHER.  GET UP  AND WALK EVERY HOUR.  CPM 0-90 DEGREES- 2 HOURS IN THE AM, 2- HOURS IN THE AFTERNOON, 2- HOURS BEFORE BED.  BLUE BLOCK WHEN SITTING DURING THE DAY.  Remove items at home which could result in a fall. This includes throw rugs or furniture in walking pathways ICE to the affected joint every three hours  while awake for 30 minutes at a time, for at least the first 3-5 days, and then as needed for pain and swelling.  Continue to use ice for pain and swelling. You may notice swelling that will progress down to the foot and ankle.  This is normal after surgery.  Elevate your leg when you are not up walking on it.   Continue to use the breathing machine you got in the hospital (incentive spirometer) which will help keep your temperature down.  It is common for your temperature to cycle up and down following surgery, especially at night when you are not up moving around and exerting yourself.  The breathing machine keeps your lungs expanded and your temperature down.   DIET:  As you were doing prior to hospitalization, we recommend a well-balanced diet.  DRESSING / WOUND CARE / SHOWERING  Keep the surgical dressing until follow up.  The dressing is water proof, so you can shower without any extra covering.  IF THE DRESSING FALLS OFF or the wound gets wet inside, change the dressing with sterile gauze.  Please use good hand washing techniques before changing the dressing.  Do not use any lotions or creams on the incision until instructed by your surgeon.    ACTIVITY  Increase activity slowly as tolerated, but follow the weight bearing instructions below.   No driving for 6 weeks or until further direction given by your physician.  You cannot drive while taking narcotics.  No lifting or carrying greater than 10 lbs. until further directed by your surgeon. Avoid periods  of inactivity such as sitting longer than an hour when not asleep. This helps prevent blood clots.  You may return to work once you are authorized by your doctor.     WEIGHT BEARING   Weight bearing as tolerated with assist device (walker, cane, etc) as directed, use it as long as suggested by your surgeon or therapist, typically at least 2-3 weeks.   EXERCISES  Results after joint replacement surgery are often greatly improved  when you follow the exercise, range of motion and muscle strengthening exercises prescribed by your doctor. Safety measures are also important to protect the joint from further injury. Any time any of these exercises cause you to have increased pain or swelling, decrease what you are doing until you are comfortable again and then slowly increase them. If you have problems or questions, call your caregiver or physical therapist for advice.   Rehabilitation is important following a joint replacement. After just a few days of immobilization, the muscles of the leg can become weakened and shrink (atrophy).  These exercises are designed to build up the tone and strength of the thigh and leg muscles and to improve motion. Often times heat used for twenty to thirty minutes before working out will loosen up your tissues and help with improving the range of motion but do not use heat for the first two weeks following surgery (sometimes heat can increase post-operative swelling).   These exercises can be done on a training (exercise) mat, on the floor, on a table or on a bed. Use whatever works the best and is most comfortable for you.    Use music or television while you are exercising so that the exercises are a pleasant break in your day. This will make your life better with the exercises acting as a break in your routine that you can look forward to.   Perform all exercises about fifteen times, three times per day or as directed.  You should exercise both the operative leg and the other leg as well.   Exercises include:  Quad Sets - Tighten up the muscle on the front of the thigh (Quad) and hold for 5-10 seconds.   Straight Leg Raises - With your knee straight (if you were given a brace, keep it on), lift the leg to 60 degrees, hold for 3 seconds, and slowly lower the leg.  Perform this exercise against resistance later as your leg gets stronger.  Leg Slides: Lying on your back, slowly slide your foot toward  your buttocks, bending your knee up off the floor (only go as far as is comfortable). Then slowly slide your foot back down until your leg is flat on the floor again.  Angel Wings: Lying on your back spread your legs to the side as far apart as you can without causing discomfort.  Hamstring Strength:  Lying on your back, push your heel against the floor with your leg straight by tightening up the muscles of your buttocks.  Repeat, but this time bend your knee to a comfortable angle, and push your heel against the floor.  You may put a pillow under the heel to make it more comfortable if necessary.   A rehabilitation program following joint replacement surgery can speed recovery and prevent re-injury in the future due to weakened muscles. Contact your doctor or a physical therapist for more information on knee rehabilitation.    CONSTIPATION  Constipation is defined medically as fewer than three stools per week and severe  constipation as less than one stool per week.  Even if you have a regular bowel pattern at home, your normal regimen is likely to be disrupted due to multiple reasons following surgery.  Combination of anesthesia, postoperative narcotics, change in appetite and fluid intake all can affect your bowels.   YOU MUST use at least one of the following options; they are listed in order of increasing strength to get the job done.  They are all available over the counter, and you may need to use some, POSSIBLY even all of these options:    Drink plenty of fluids (prune juice may be helpful) and high fiber foods Colace 100 mg by mouth twice a day  Senokot for constipation as directed and as needed Dulcolax (bisacodyl), take with full glass of water  Miralax (polyethylene glycol) once or twice a day as needed.  If you have tried all these things and are unable to have a bowel movement in the first 3-4 days after surgery call either your surgeon or your primary doctor.    If you experience  loose stools or diarrhea, hold the medications until you stool forms back up.  If your symptoms do not get better within 1 week or if they get worse, check with your doctor.  If you experience "the worst abdominal pain ever" or develop nausea or vomiting, please contact the office immediately for further recommendations for treatment.   ITCHING:  If you experience itching with your medications, try taking only a single pain pill, or even half a pain pill at a time.  You can also use Benadryl over the counter for itching or also to help with sleep.   TED HOSE STOCKINGS:  Use stockings on both legs until for at least 2 weeks or as directed by physician office. They may be removed at night for sleeping.  MEDICATIONS:  See your medication summary on the "After Visit Summary" that nursing will review with you.  You may have some home medications which will be placed on hold until you complete the course of blood thinner medication.  It is important for you to complete the blood thinner medication as prescribed.  PRECAUTIONS:  If you experience chest pain or shortness of breath - call 911 immediately for transfer to the hospital emergency department.   If you develop a fever greater that 101 F, purulent drainage from wound, increased redness or drainage from wound, foul odor from the wound/dressing, or calf pain - CONTACT YOUR SURGEON.                                                   FOLLOW-UP APPOINTMENTS:  If you do not already have a post-op appointment, please call the office for an appointment to be seen by your surgeon.  Guidelines for how soon to be seen are listed in your "After Visit Summary", but are typically between 1-4 weeks after surgery.  OTHER INSTRUCTIONS:   Knee Replacement:  Do not place pillow under knee, focus on keeping the knee straight while resting. CPM instructions: 0-90 degrees, 2 hours in the morning, 2 hours in the afternoon, and 2 hours in the evening. Place foam block,  curve side up under heel at all times except when in CPM or when walking.  DO NOT modify, tear, cut, or change the foam block in any  way.  MAKE SURE YOU:  Understand these instructions.  Get help right away if you are not doing well or get worse.    Thank you for letting us be a part of your medical care team.  It is a privilege we respect greatly.  We hope these instructions will help you stay on track for a fast and full recovery!   Do not put a pillow under the knee. Place it under the heel.   Complete by: As directed    Place gray foam block, curve side up under heel at all times except when in CPM or when walking.  DO NOT modify, tear, cut, or change in any way the gray foam block.   Increase activity slowly as tolerated   Complete by: As directed    Patient may shower   Complete by: As directed    Aquacel dressing is water proof    Wash over it and the whole leg with soap and water at the end of your shower   TED hose   Complete by: As directed    Use stockings (TED hose) for 2 weeks on both leg(s).  You may remove them at night for sleeping.      Follow-up Information    [facility] Follow up.   Specialty: [place] Why: already schedule to come out tomorrow.   Contact information: [address] [place] [address] [phone]        Elsie Saas, MD Follow up on 10/10/2019.   Specialty: Orthopedic Surgery Why: arrive at 2:30 in Physical Therapy for a 3 pm appointment.  See Dr Noemi Chapel after Therapy visit  Contact information: [place] [place] [address] [phone]            Signed: Linda Hedges 09/27/2019, 8:52 AM

## 2019-09-27 NOTE — TOC Transition Note (Signed)
Transition of Care [facility]) - CM/SW Discharge Note   Patient Details  Name: Tiffany Robles MRN: [id number] Date of Birth: 07-Jul-1956  Transition of Care [facility]) CM/SW Contact:  Lia Hopping, [place] Phone Number: 09/27/2019, 9:56 AM   Clinical Narrative:       Final next level of care: Home w Home Health Services Barriers to Discharge: No Barriers Identified   Patient Goals and CMS Choice   CMS Medicare.gov Compare Post Acute Care list provided to:: (Prearranged in Orthopedic Office) Choice offered to / list presented to : NA  Discharge Placement                       Discharge Plan and Services                DME Arranged: 3-N-1, Walker rolling DME Agency: [employer] Date DME Agency Contacted: 09/27/19 Time DME Agency Contacted: 0900 Representative spoke with at DME Agency: Ovid Curd HH Arranged: PT [place]: [facility] [employer] Date [place]: 09/27/19 Time [place]: [phone] Representative spoke with at [place]: [place] ([place]) Interventions     Readmission Risk Interventions No flowsheet data found.

## 2019-09-27 NOTE — Progress Notes (Signed)
Physical Therapy Treatment Patient Details Name: Tiffany Robles MRN: [id number] DOB: 1956/02/22 Today's Date: 09/27/2019    History of Present Illness s/p L TKA    PT Comments    Pt progressing well.  Will see for second session to review HEP  Follow Up Recommendations  Follow surgeon's recommendation for DC plan and follow-up therapies     Equipment Recommendations  3in1 (PT);Rolling walker with 5" wheels    Recommendations for Other Services       Precautions / Restrictions Precautions Precautions: Knee Restrictions Weight Bearing Restrictions: No    Mobility  Bed Mobility               General bed mobility comments: OOB in chair   Transfers Overall transfer level: Needs assistance Equipment used: Rolling walker (2 wheeled) Transfers: Sit to/from Stand Sit to Stand: Min guard;Supervision         General transfer comment: cues for hand placement  Ambulation/Gait Ambulation/Gait assistance: Min guard Gait Distance (Feet): 100 Feet Assistive device: Rolling walker (2 wheeled) Gait Pattern/deviations: Step-to pattern;Decreased stance time - left;Decreased weight shift to left Gait velocity:     General Gait Details: cues for sequence and RW position from self   Stairs Stairs: Yes Stairs assistance: Min assist;Min guard Stair Management: Step to pattern;With walker;Forwards Number of Stairs: 4 General stair comments: cues for technique and sequence   Wheelchair Mobility    Modified Rankin (Stroke Patients Only)       Balance                                            Cognition Arousal/Alertness: Awake/alert Behavior During Therapy: WFL for tasks assessed/performed Overall Cognitive Status: Within Functional Limits for tasks assessed                                        Exercises      General Comments        Pertinent Vitals/Pain Pain Assessment: 0-10 Pain Score: 5  Pain Location: L  knee Pain Descriptors / Indicators: Grimacing;Guarding Pain Intervention(s): Limited activity within patient's tolerance;Monitored during session;Premedicated before session    Home Living                      Prior Function            PT Goals (current goals can now be found in the care plan section) Acute Rehab PT Goals PT Goal Formulation: With patient Time For Goal Achievement: 10/03/19 Potential to Achieve Goals: Good Progress towards PT goals: Progressing toward goals    Frequency    7X/week      PT Plan Current plan remains appropriate    Co-evaluation              AM-PAC PT "6 Clicks" Mobility   Outcome Measure  Help needed turning from your back to your side while in a flat bed without using bedrails?: A Little Help needed moving from lying on your back to sitting on the side of a flat bed without using bedrails?: A Little Help needed moving to and from a bed to a chair (including a wheelchair)?: A Little Help needed standing up from a chair using your arms (e.g., wheelchair or bedside chair)?: A Little Help  needed to walk in hospital room?: A Little Help needed climbing 3-5 steps with a railing? : A Little 6 Click Score: 18    End of Session Equipment Utilized During Treatment: Gait belt Activity Tolerance: Patient tolerated treatment well Patient left: in chair;with call bell/phone within reach;with family/visitor present;with chair alarm set   PT Visit Diagnosis: Difficulty in walking, not elsewhere classified (R26.2)     Time: [id number] PT Time Calculation (min) (ACUTE ONLY): 13 min  Charges:  $Gait Training: 8-22 mins                     Kenyon Ana, PT  Pager: [phone] Acute Rehab Dept [facility]): [phone]   09/27/2019    [facility] 09/27/2019, 11:34 AM

## 2019-10-24 ENCOUNTER — Other Ambulatory Visit: Payer: Self-pay | Admitting: Neurosurgery

## 2019-10-28 ENCOUNTER — Encounter (HOSPITAL_COMMUNITY): Payer: Self-pay | Admitting: *Deleted

## 2019-10-28 ENCOUNTER — Other Ambulatory Visit (HOSPITAL_COMMUNITY)
Admission: RE | Admit: 2019-10-28 | Discharge: 2019-10-28 | Disposition: A | Discharge status: Home / Self Care | Payer: Medicare HMO | Source: Ambulatory Visit | Attending: Neurosurgery | Admitting: Neurosurgery

## 2019-10-28 ENCOUNTER — Other Ambulatory Visit: Payer: Self-pay

## 2019-10-28 DIAGNOSIS — Z01812 Encounter for preprocedural laboratory examination: Secondary | ICD-10-CM | POA: Insufficient documentation

## 2019-10-28 DIAGNOSIS — Z20828 Contact with and (suspected) exposure to other viral communicable diseases: Secondary | ICD-10-CM | POA: Insufficient documentation

## 2019-10-28 LAB — SARS CORONAVIRUS 2 (TAT 6-24 HRS): SARS Coronavirus 2: NEGATIVE

## 2019-10-28 NOTE — Progress Notes (Signed)
Gave pt. Pre-op instructions. Pt. To check blood sugar when she gets up and every 2 hrs. Until arrival. If sugar less 70 pt. To drink 1/2 cup clear juice or 4 glucose tabs or glucose gel. To recheck sugar in 15 minutes to see if sugar is above 70, if not to call short stay for further instructions. Pt. Was swabbed for covid today, instructed to self quarentine.

## 2019-10-30 NOTE — Anesthesia Preprocedure Evaluation (Addendum)
Anesthesia Evaluation  Patient identified by MRN, date of birth, ID band Patient awake    Reviewed: Allergy & Precautions, NPO status , Patient's Chart, lab work & pertinent test results  Airway Mallampati: II  TM Distance: >3 FB Neck ROM: Full    Dental  (+) Teeth Intact, Dental Advisory Given   Pulmonary former smoker,    breath sounds clear to auscultation       Cardiovascular hypertension, Pt. on medications  Rhythm:Regular Rate:Normal     Neuro/Psych PSYCHIATRIC DISORDERS Anxiety Depression    GI/Hepatic   Endo/Other  diabetes, Poorly Controlled, Type 2  Renal/GU      Musculoskeletal  (+) Arthritis ,   Abdominal   Peds  Hematology   Anesthesia Other Findings   Reproductive/Obstetrics                             Anesthesia Physical  Anesthesia Plan  ASA: III  Anesthesia Plan: General   Post-op Pain Management:  Regional for Post-op pain   Induction:   PONV Risk Score and Plan: 4 or greater and Ondansetron, Dexamethasone and Treatment may vary due to age or medical condition  Airway Management Planned: Oral ETT  Additional Equipment: None  Intra-op Plan:   Post-operative Plan: Extubation in OR  Informed Consent: I have reviewed the patients History and Physical, chart, labs and discussed the procedure including the risks, benefits and alternatives for the proposed anesthesia with the patient or authorized representative who has indicated his/her understanding and acceptance.     Dental advisory given  Plan Discussed with: CRNA  Anesthesia Plan Comments: (Patient with poorly controlled DM. Blood glc 335 this morning and has been ~300 for ~ 1 month. A1c 7.8. I had a discussion with the patient about wound infection risks and the potential disastrous consequences they can have after back surgery. She is adamant she must have her surgery. I spoke to Dr. Saintclair Halsted about this and  my reluctance to proceed. He stated that it likely stems from her steroids for her nerve pain. He stated he's committed to getting her DM well controlled this admission. He ordered 15 u SQ. Will obtain FSBS in OR.  )       Anesthesia Quick Evaluation

## 2019-10-31 ENCOUNTER — Encounter (HOSPITAL_COMMUNITY)
Admission: RE | Disposition: A | Discharge status: Home / Self Care | Payer: Self-pay | Source: Home / Self Care | Attending: Neurosurgery

## 2019-10-31 ENCOUNTER — Ambulatory Visit (HOSPITAL_COMMUNITY): Payer: Medicare HMO | Admitting: Certified Registered Nurse Anesthetist

## 2019-10-31 ENCOUNTER — Encounter (HOSPITAL_COMMUNITY): Payer: Self-pay

## 2019-10-31 ENCOUNTER — Other Ambulatory Visit: Payer: Self-pay

## 2019-10-31 ENCOUNTER — Ambulatory Visit (HOSPITAL_COMMUNITY)
Admission: RE | Admit: 2019-10-31 | Discharge: 2019-11-01 | Disposition: A | Discharge status: Home / Self Care | Payer: Medicare HMO | Attending: Neurosurgery | Admitting: Neurosurgery

## 2019-10-31 ENCOUNTER — Ambulatory Visit (HOSPITAL_COMMUNITY): Payer: Medicare HMO

## 2019-10-31 DIAGNOSIS — M4316 Spondylolisthesis, lumbar region: Secondary | ICD-10-CM | POA: Diagnosis not present

## 2019-10-31 DIAGNOSIS — E119 Type 2 diabetes mellitus without complications: Secondary | ICD-10-CM | POA: Diagnosis not present

## 2019-10-31 DIAGNOSIS — Z96652 Presence of left artificial knee joint: Secondary | ICD-10-CM | POA: Diagnosis not present

## 2019-10-31 DIAGNOSIS — Z79899 Other long term (current) drug therapy: Secondary | ICD-10-CM | POA: Diagnosis not present

## 2019-10-31 DIAGNOSIS — Z881 Allergy status to other antibiotic agents status: Secondary | ICD-10-CM | POA: Insufficient documentation

## 2019-10-31 DIAGNOSIS — Z87891 Personal history of nicotine dependence: Secondary | ICD-10-CM | POA: Insufficient documentation

## 2019-10-31 DIAGNOSIS — Z791 Long term (current) use of non-steroidal anti-inflammatories (NSAID): Secondary | ICD-10-CM | POA: Diagnosis not present

## 2019-10-31 DIAGNOSIS — Z7984 Long term (current) use of oral hypoglycemic drugs: Secondary | ICD-10-CM | POA: Diagnosis not present

## 2019-10-31 DIAGNOSIS — Z880 Allergy status to penicillin: Secondary | ICD-10-CM | POA: Diagnosis not present

## 2019-10-31 DIAGNOSIS — F329 Major depressive disorder, single episode, unspecified: Secondary | ICD-10-CM | POA: Diagnosis not present

## 2019-10-31 DIAGNOSIS — F419 Anxiety disorder, unspecified: Secondary | ICD-10-CM | POA: Insufficient documentation

## 2019-10-31 DIAGNOSIS — M7138 Other bursal cyst, other site: Secondary | ICD-10-CM | POA: Insufficient documentation

## 2019-10-31 DIAGNOSIS — Z885 Allergy status to narcotic agent status: Secondary | ICD-10-CM | POA: Diagnosis not present

## 2019-10-31 DIAGNOSIS — Z8249 Family history of ischemic heart disease and other diseases of the circulatory system: Secondary | ICD-10-CM | POA: Insufficient documentation

## 2019-10-31 DIAGNOSIS — M1712 Unilateral primary osteoarthritis, left knee: Secondary | ICD-10-CM | POA: Diagnosis not present

## 2019-10-31 DIAGNOSIS — Z419 Encounter for procedure for purposes other than remedying health state, unspecified: Secondary | ICD-10-CM

## 2019-10-31 DIAGNOSIS — Z7901 Long term (current) use of anticoagulants: Secondary | ICD-10-CM | POA: Diagnosis not present

## 2019-10-31 DIAGNOSIS — Z833 Family history of diabetes mellitus: Secondary | ICD-10-CM | POA: Insufficient documentation

## 2019-10-31 DIAGNOSIS — M5416 Radiculopathy, lumbar region: Secondary | ICD-10-CM | POA: Diagnosis present

## 2019-10-31 DIAGNOSIS — I10 Essential (primary) hypertension: Secondary | ICD-10-CM | POA: Insufficient documentation

## 2019-10-31 DIAGNOSIS — Z7982 Long term (current) use of aspirin: Secondary | ICD-10-CM | POA: Diagnosis not present

## 2019-10-31 DIAGNOSIS — M48061 Spinal stenosis, lumbar region without neurogenic claudication: Secondary | ICD-10-CM | POA: Diagnosis present

## 2019-10-31 DIAGNOSIS — Z8541 Personal history of malignant neoplasm of cervix uteri: Secondary | ICD-10-CM | POA: Insufficient documentation

## 2019-10-31 HISTORY — DX: Malignant (primary) neoplasm, unspecified: C80.1

## 2019-10-31 HISTORY — PX: LUMBAR LAMINECTOMY/DECOMPRESSION MICRODISCECTOMY: SHX5026

## 2019-10-31 HISTORY — DX: Depression, unspecified: F32.A

## 2019-10-31 LAB — CBC
HCT: 39.9 % (ref 36.0–46.0)
Hemoglobin: 13.1 g/dL (ref 12.0–15.0)
MCH: 28.9 pg (ref 26.0–34.0)
MCHC: 32.8 g/dL (ref 30.0–36.0)
MCV: 88.1 fL (ref 80.0–100.0)
Platelets: 250 10*3/uL (ref 150–400)
RBC: 4.53 MIL/uL (ref 3.87–5.11)
RDW: 12.8 % (ref 11.5–15.5)
WBC: 11.3 10*3/uL — ABNORMAL HIGH (ref 4.0–10.5)
nRBC: 0 % (ref 0.0–0.2)

## 2019-10-31 LAB — BASIC METABOLIC PANEL
Anion gap: 15 (ref 5–15)
BUN: 28 mg/dL — ABNORMAL HIGH (ref 8–23)
CO2: 22 mmol/L (ref 22–32)
Calcium: 8.9 mg/dL (ref 8.9–10.3)
Chloride: 94 mmol/L — ABNORMAL LOW (ref 98–111)
Creatinine, Ser: 1.09 mg/dL — ABNORMAL HIGH (ref 0.44–1.00)
GFR calc Af Amer: >60 mL/min (ref >60)
GFR calc non Af Amer: 53 mL/min — ABNORMAL LOW (ref >60)
Glucose, Bld: 338 mg/dL — ABNORMAL HIGH (ref 70–99)
Potassium: 4 mmol/L (ref 3.5–5.1)
Sodium: 131 mmol/L — ABNORMAL LOW (ref 135–145)

## 2019-10-31 LAB — PROTIME-INR
INR: 1 (ref 0.8–1.2)
Prothrombin Time: 12.7 seconds (ref 11.4–15.2)

## 2019-10-31 LAB — GLUCOSE, CAPILLARY
Glucose-Capillary: 335 mg/dL — ABNORMAL HIGH (ref 70–99)
Glucose-Capillary: 260 mg/dL — ABNORMAL HIGH (ref 70–99)
Glucose-Capillary: 284 mg/dL — ABNORMAL HIGH (ref 70–99)
Glucose-Capillary: 354 mg/dL — ABNORMAL HIGH (ref 70–99)
Glucose-Capillary: 131 mg/dL — ABNORMAL HIGH (ref 70–99)
Glucose-Capillary: 279 mg/dL — ABNORMAL HIGH (ref 70–99)

## 2019-10-31 SURGERY — LUMBAR LAMINECTOMY/DECOMPRESSION MICRODISCECTOMY 1 LEVEL
Anesthesia: General | Site: Back | Laterality: Left

## 2019-10-31 MED ORDER — FENTANYL CITRATE (PF) 250 MCG/5ML IJ SOLN
INTRAMUSCULAR | Status: AC
  Filled 2019-10-31: qty 5

## 2019-10-31 MED ORDER — LIDOCAINE-EPINEPHRINE 1 %-1:100000 IJ SOLN
INTRAMUSCULAR | Status: AC
  Filled 2019-10-31: qty 1

## 2019-10-31 MED ORDER — BUPIVACAINE HCL (PF) 0.25 % IJ SOLN
INTRAMUSCULAR | Status: AC
  Filled 2019-10-31: qty 30

## 2019-10-31 MED ORDER — MENTHOL 3 MG MT LOZG: 1.0000 | LOZENGE | OROMUCOSAL | Status: DC | PRN

## 2019-10-31 MED ORDER — MEPERIDINE HCL 25 MG/ML IJ SOLN: 6.2500 mg | INTRAMUSCULAR | Status: DC | PRN

## 2019-10-31 MED ORDER — ONDANSETRON HCL 4 MG/2ML IJ SOLN: 4.0000 mg | Freq: Once | INTRAMUSCULAR | Status: DC | PRN

## 2019-10-31 MED ORDER — ADULT MULTIVITAMIN W/MINERALS CH
1.0000 | ORAL_TABLET | Freq: Every day | ORAL | Status: DC
  Administered 2019-10-31: 12:00:00 1 via ORAL
  Filled 2019-10-31: qty 1

## 2019-10-31 MED ORDER — INSULIN ASPART 100 UNIT/ML ~~LOC~~ SOLN
0.0000 [IU] | Freq: Three times a day (TID) | SUBCUTANEOUS | Status: DC
  Administered 2019-10-31: 12:00:00 15 [IU] via SUBCUTANEOUS
  Administered 2019-10-31: 16:00:00 2 [IU] via SUBCUTANEOUS
  Administered 2019-11-01: 8 [IU] via SUBCUTANEOUS

## 2019-10-31 MED ORDER — MIDAZOLAM HCL 5 MG/5ML IJ SOLN
INTRAMUSCULAR | Status: DC | PRN
  Administered 2019-10-31 (×2): 1 mg via INTRAVENOUS

## 2019-10-31 MED ORDER — INSULIN ASPART 100 UNIT/ML ~~LOC~~ SOLN
0.0000 [IU] | Freq: Every day | SUBCUTANEOUS | Status: DC
  Administered 2019-10-31: 21:00:00 3 [IU] via SUBCUTANEOUS

## 2019-10-31 MED ORDER — DIPHENHYDRAMINE HCL 50 MG/ML IJ SOLN
INTRAMUSCULAR | Status: DC | PRN
  Administered 2019-10-31: 6.25 mg via INTRAVENOUS

## 2019-10-31 MED ORDER — VANCOMYCIN HCL IN DEXTROSE 1-5 GM/200ML-% IV SOLN
1000.0000 mg | Freq: Once | INTRAVENOUS | Status: AC
  Administered 2019-10-31: 1000 mg via INTRAVENOUS

## 2019-10-31 MED ORDER — HYDROMORPHONE HCL 1 MG/ML IJ SOLN: 0.5000 mg | INTRAMUSCULAR | Status: DC | PRN

## 2019-10-31 MED ORDER — THROMBIN 5000 UNITS EX SOLR
CUTANEOUS | Status: DC | PRN
  Administered 2019-10-31 (×2): 5000 [IU] via TOPICAL

## 2019-10-31 MED ORDER — METFORMIN HCL ER 500 MG PO TB24
1000.0000 mg | ORAL_TABLET | Freq: Two times a day (BID) | ORAL | Status: DC
  Administered 2019-10-31 – 2019-11-01 (×2): 1000 mg via ORAL
  Filled 2019-10-31 (×2): qty 2

## 2019-10-31 MED ORDER — MIDAZOLAM HCL 2 MG/2ML IJ SOLN
INTRAMUSCULAR | Status: AC
  Filled 2019-10-31: qty 2

## 2019-10-31 MED ORDER — ACETAMINOPHEN 650 MG RE SUPP: 650.0000 mg | RECTAL | Status: DC | PRN

## 2019-10-31 MED ORDER — ONDANSETRON HCL 4 MG/2ML IJ SOLN: 4.0000 mg | Freq: Four times a day (QID) | INTRAMUSCULAR | Status: DC | PRN

## 2019-10-31 MED ORDER — PANTOPRAZOLE SODIUM 40 MG PO TBEC
40.0000 mg | DELAYED_RELEASE_TABLET | Freq: Every day | ORAL | Status: DC
  Administered 2019-10-31: 21:00:00 40 mg via ORAL
  Filled 2019-10-31: qty 1

## 2019-10-31 MED ORDER — PROPOFOL 10 MG/ML IV BOLUS
INTRAVENOUS | Status: DC | PRN
  Administered 2019-10-31: 120 mg via INTRAVENOUS

## 2019-10-31 MED ORDER — INSULIN ASPART 100 UNIT/ML ~~LOC~~ SOLN
15.0000 [IU] | Freq: Once | SUBCUTANEOUS | Status: AC
  Administered 2019-10-31: 08:00:00 15 [IU] via SUBCUTANEOUS

## 2019-10-31 MED ORDER — VANCOMYCIN HCL IN DEXTROSE 1-5 GM/200ML-% IV SOLN
INTRAVENOUS | Status: AC
  Filled 2019-10-31: qty 200

## 2019-10-31 MED ORDER — ONDANSETRON HCL 4 MG/2ML IJ SOLN
INTRAMUSCULAR | Status: DC | PRN
  Administered 2019-10-31: 4 mg via INTRAVENOUS

## 2019-10-31 MED ORDER — PROPOFOL 1000 MG/100ML IV EMUL
INTRAVENOUS | Status: AC
  Filled 2019-10-31: qty 100

## 2019-10-31 MED ORDER — INSULIN ASPART 100 UNIT/ML ~~LOC~~ SOLN
SUBCUTANEOUS | Status: AC
  Filled 2019-10-31: qty 1

## 2019-10-31 MED ORDER — HYDROXYZINE HCL 50 MG/ML IM SOLN: 50.0000 mg | Freq: Four times a day (QID) | INTRAMUSCULAR | Status: DC | PRN

## 2019-10-31 MED ORDER — GLIPIZIDE 5 MG PO TABS
5.0000 mg | ORAL_TABLET | Freq: Two times a day (BID) | ORAL | Status: DC
  Administered 2019-10-31 (×2): 5 mg via ORAL
  Filled 2019-10-31 (×2): qty 1

## 2019-10-31 MED ORDER — CYCLOBENZAPRINE HCL 10 MG PO TABS
10.0000 mg | ORAL_TABLET | Freq: Three times a day (TID) | ORAL | Status: DC | PRN
  Administered 2019-10-31: 12:00:00 10 mg via ORAL
  Filled 2019-10-31: qty 1

## 2019-10-31 MED ORDER — THROMBIN 5000 UNITS EX SOLR
CUTANEOUS | Status: AC
  Filled 2019-10-31: qty 10000

## 2019-10-31 MED ORDER — HEMOSTATIC AGENTS (NO CHARGE) OPTIME
TOPICAL | Status: DC | PRN
  Administered 2019-10-31: 1 via TOPICAL

## 2019-10-31 MED ORDER — CELECOXIB 200 MG PO CAPS
200.0000 mg | ORAL_CAPSULE | Freq: Every day | ORAL | Status: DC
  Administered 2019-10-31: 12:00:00 200 mg via ORAL
  Filled 2019-10-31: qty 1

## 2019-10-31 MED ORDER — HYDROCHLOROTHIAZIDE 25 MG PO TABS
25.0000 mg | ORAL_TABLET | Freq: Every day | ORAL | Status: DC
  Administered 2019-10-31: 12:00:00 25 mg via ORAL
  Filled 2019-10-31: qty 1

## 2019-10-31 MED ORDER — DEXAMETHASONE SODIUM PHOSPHATE 10 MG/ML IJ SOLN
INTRAMUSCULAR | Status: AC
  Filled 2019-10-31: qty 1

## 2019-10-31 MED ORDER — MUPIROCIN 2 % EX OINT
1.0000 "application " | TOPICAL_OINTMENT | Freq: Two times a day (BID) | CUTANEOUS | Status: DC
  Administered 2019-10-31 (×2): 1 via NASAL
  Filled 2019-10-31: qty 22

## 2019-10-31 MED ORDER — SODIUM CHLORIDE 0.9% FLUSH: 3.0000 mL | INTRAVENOUS | Status: DC | PRN

## 2019-10-31 MED ORDER — BUPIVACAINE HCL (PF) 0.25 % IJ SOLN
INTRAMUSCULAR | Status: DC | PRN
  Administered 2019-10-31: 10 mL

## 2019-10-31 MED ORDER — ZOLPIDEM TARTRATE 5 MG PO TABS: 5.0000 mg | ORAL_TABLET | Freq: Every day | ORAL | Status: DC

## 2019-10-31 MED ORDER — HYDROMORPHONE HCL 1 MG/ML IJ SOLN: 0.2500 mg | INTRAMUSCULAR | Status: DC | PRN

## 2019-10-31 MED ORDER — 0.9 % SODIUM CHLORIDE (POUR BTL) OPTIME
TOPICAL | Status: DC | PRN
  Administered 2019-10-31: 1000 mL

## 2019-10-31 MED ORDER — LISINOPRIL-HYDROCHLOROTHIAZIDE 20-25 MG PO TABS: 1.0000 | ORAL_TABLET | Freq: Every day | ORAL | Status: DC

## 2019-10-31 MED ORDER — ATORVASTATIN CALCIUM 40 MG PO TABS
40.0000 mg | ORAL_TABLET | Freq: Every evening | ORAL | Status: DC
  Administered 2019-10-31: 19:00:00 40 mg via ORAL
  Filled 2019-10-31: qty 1

## 2019-10-31 MED ORDER — OXYCODONE HCL 5 MG PO TABS: 10.0000 mg | ORAL_TABLET | ORAL | Status: DC | PRN

## 2019-10-31 MED ORDER — SODIUM CHLORIDE 0.9 % IV SOLN: 250.0000 mL | INTRAVENOUS | Status: DC

## 2019-10-31 MED ORDER — ACETAMINOPHEN 325 MG PO TABS: 650.0000 mg | ORAL_TABLET | ORAL | Status: DC | PRN

## 2019-10-31 MED ORDER — ACETAMINOPHEN 10 MG/ML IV SOLN
INTRAVENOUS | Status: AC
  Filled 2019-10-31: qty 100

## 2019-10-31 MED ORDER — ACETAMINOPHEN 10 MG/ML IV SOLN
INTRAVENOUS | Status: DC | PRN
  Administered 2019-10-31: 1000 mg via INTRAVENOUS

## 2019-10-31 MED ORDER — INSULIN ASPART 100 UNIT/ML ~~LOC~~ SOLN
4.0000 [IU] | Freq: Three times a day (TID) | SUBCUTANEOUS | Status: DC
  Administered 2019-10-31 – 2019-11-01 (×3): 4 [IU] via SUBCUTANEOUS

## 2019-10-31 MED ORDER — ACETAMINOPHEN 500 MG PO TABS: 1000.0000 mg | ORAL_TABLET | Freq: Four times a day (QID) | ORAL | Status: DC | PRN

## 2019-10-31 MED ORDER — ONDANSETRON HCL 4 MG/2ML IJ SOLN
INTRAMUSCULAR | Status: AC
  Filled 2019-10-31: qty 2

## 2019-10-31 MED ORDER — ALUM & MAG HYDROXIDE-SIMETH 200-200-20 MG/5ML PO SUSP: 30.0000 mL | Freq: Four times a day (QID) | ORAL | Status: DC | PRN

## 2019-10-31 MED ORDER — DULAGLUTIDE 1.5 MG/0.5ML ~~LOC~~ SOAJ: 3.0000 mg | SUBCUTANEOUS | Status: DC

## 2019-10-31 MED ORDER — HYDROCODONE-ACETAMINOPHEN 5-325 MG PO TABS
1.0000 | ORAL_TABLET | ORAL | Status: DC | PRN
  Administered 2019-10-31: 19:00:00 1 via ORAL
  Administered 2019-10-31: 12:00:00 2 via ORAL
  Filled 2019-10-31: qty 2
  Filled 2019-10-31: qty 1

## 2019-10-31 MED ORDER — LIDOCAINE-EPINEPHRINE 1 %-1:100000 IJ SOLN
INTRAMUSCULAR | Status: DC | PRN
  Administered 2019-10-31: 10 mL

## 2019-10-31 MED ORDER — SODIUM CHLORIDE 0.9 % IV SOLN
INTRAVENOUS | Status: DC | PRN
  Administered 2019-10-31: 500 mL

## 2019-10-31 MED ORDER — ONDANSETRON HCL 4 MG PO TABS: 4.0000 mg | ORAL_TABLET | Freq: Four times a day (QID) | ORAL | Status: DC | PRN

## 2019-10-31 MED ORDER — PANTOPRAZOLE SODIUM 40 MG IV SOLR: 40.0000 mg | Freq: Every day | INTRAVENOUS | Status: DC

## 2019-10-31 MED ORDER — LACTATED RINGERS IV SOLN
INTRAVENOUS | Status: DC | PRN
  Administered 2019-10-31: 07:00:00 via INTRAVENOUS

## 2019-10-31 MED ORDER — VANCOMYCIN HCL IN DEXTROSE 1-5 GM/200ML-% IV SOLN
1000.0000 mg | Freq: Once | INTRAVENOUS | Status: AC
  Administered 2019-10-31: 1000 mg via INTRAVENOUS
  Filled 2019-10-31: qty 200

## 2019-10-31 MED ORDER — PROPOFOL 500 MG/50ML IV EMUL
INTRAVENOUS | Status: DC | PRN
  Administered 2019-10-31: 25 ug/kg/min via INTRAVENOUS

## 2019-10-31 MED ORDER — LIDOCAINE 2% (20 MG/ML) 5 ML SYRINGE
INTRAMUSCULAR | Status: AC
  Filled 2019-10-31: qty 5

## 2019-10-31 MED ORDER — PHENOL 1.4 % MT LIQD: 1.0000 | OROMUCOSAL | Status: DC | PRN

## 2019-10-31 MED ORDER — ROCURONIUM BROMIDE 10 MG/ML (PF) SYRINGE
PREFILLED_SYRINGE | INTRAVENOUS | Status: AC
  Filled 2019-10-31: qty 10

## 2019-10-31 MED ORDER — PROPOFOL 10 MG/ML IV BOLUS
INTRAVENOUS | Status: AC
  Filled 2019-10-31: qty 20

## 2019-10-31 MED ORDER — LISINOPRIL 20 MG PO TABS
20.0000 mg | ORAL_TABLET | Freq: Every day | ORAL | Status: DC
  Administered 2019-10-31: 12:00:00 20 mg via ORAL
  Filled 2019-10-31: qty 1

## 2019-10-31 MED ORDER — PAROXETINE HCL 30 MG PO TABS
60.0000 mg | ORAL_TABLET | Freq: Every day | ORAL | Status: DC
  Administered 2019-10-31: 12:00:00 60 mg via ORAL
  Filled 2019-10-31 (×2): qty 2

## 2019-10-31 MED ORDER — LIDOCAINE 2% (20 MG/ML) 5 ML SYRINGE
INTRAMUSCULAR | Status: DC | PRN
  Administered 2019-10-31: 100 mg via INTRAVENOUS

## 2019-10-31 MED ORDER — SODIUM CHLORIDE 0.9% FLUSH
3.0000 mL | Freq: Two times a day (BID) | INTRAVENOUS | Status: DC
  Administered 2019-10-31: 22:00:00 3 mL via INTRAVENOUS

## 2019-10-31 MED ORDER — ROCURONIUM BROMIDE 100 MG/10ML IV SOLN
INTRAVENOUS | Status: DC | PRN
  Administered 2019-10-31: 5 mg via INTRAVENOUS
  Administered 2019-10-31: 10 mg via INTRAVENOUS
  Administered 2019-10-31: 50 mg via INTRAVENOUS

## 2019-10-31 MED ORDER — SUGAMMADEX SODIUM 200 MG/2ML IV SOLN
INTRAVENOUS | Status: DC | PRN
  Administered 2019-10-31: 200 mg via INTRAVENOUS

## 2019-10-31 MED ORDER — FENTANYL CITRATE (PF) 250 MCG/5ML IJ SOLN
INTRAMUSCULAR | Status: DC | PRN
  Administered 2019-10-31: 25 ug via INTRAVENOUS
  Administered 2019-10-31 (×2): 50 ug via INTRAVENOUS
  Administered 2019-10-31: 25 ug via INTRAVENOUS
  Administered 2019-10-31: 50 ug via INTRAVENOUS

## 2019-10-31 SURGICAL SUPPLY — 55 items
BAG DECANTER FOR FLEXI CONT (MISCELLANEOUS) ×3 IMPLANT
BENZOIN TINCTURE PRP APPL 2/3 (GAUZE/BANDAGES/DRESSINGS) ×3 IMPLANT
BLADE CLIPPER SURG (BLADE) IMPLANT
BLADE SURG 11 STRL SS (BLADE) ×3 IMPLANT
BUR CUTTER 7.0 ROUND (BURR) ×3 IMPLANT
BUR MATCHSTICK NEURO 3.0 LAGG (BURR) ×3 IMPLANT
CANISTER SUCT 3000ML PPV (MISCELLANEOUS) ×3 IMPLANT
CARTRIDGE OIL MAESTRO DRILL (MISCELLANEOUS) ×1 IMPLANT
CLOSURE WOUND 1/2 X4 (GAUZE/BANDAGES/DRESSINGS) ×1
COVER WAND RF STERILE (DRAPES) ×3 IMPLANT
DECANTER SPIKE VIAL GLASS SM (MISCELLANEOUS) ×3 IMPLANT
DERMABOND ADVANCED (GAUZE/BANDAGES/DRESSINGS) ×2
DERMABOND ADVANCED .7 DNX12 (GAUZE/BANDAGES/DRESSINGS) ×1 IMPLANT
DIFFUSER DRILL AIR PNEUMATIC (MISCELLANEOUS) ×3 IMPLANT
DRAPE HALF SHEET 40X57 (DRAPES) ×3 IMPLANT
DRAPE LAPAROTOMY 100X72X124 (DRAPES) ×3 IMPLANT
DRAPE MICROSCOPE LEICA (MISCELLANEOUS) ×3 IMPLANT
DRAPE SURG 17X23 STRL (DRAPES) ×3 IMPLANT
DRSG OPSITE POSTOP 4X6 (GAUZE/BANDAGES/DRESSINGS) ×3 IMPLANT
DURAPREP 26ML APPLICATOR (WOUND CARE) ×3 IMPLANT
ELECT REM PT RETURN 9FT ADLT (ELECTROSURGICAL) ×3
ELECTRODE REM PT RTRN 9FT ADLT (ELECTROSURGICAL) ×1 IMPLANT
GAUZE 4X4 16PLY RFD (DISPOSABLE) IMPLANT
GAUZE SPONGE 4X4 12PLY STRL (GAUZE/BANDAGES/DRESSINGS) ×3 IMPLANT
GLOVE BIO SURGEON STRL SZ7 (GLOVE) IMPLANT
GLOVE BIO SURGEON STRL SZ8 (GLOVE) ×3 IMPLANT
GLOVE BIOGEL PI IND STRL 7.0 (GLOVE) IMPLANT
GLOVE BIOGEL PI IND STRL 8 (GLOVE) ×1 IMPLANT
GLOVE BIOGEL PI INDICATOR 7.0 (GLOVE)
GLOVE BIOGEL PI INDICATOR 8 (GLOVE) ×2
GLOVE EXAM NITRILE XL STR (GLOVE) IMPLANT
GLOVE INDICATOR 8.5 STRL (GLOVE) ×3 IMPLANT
GLOVE SURG SS PI 7.0 STRL IVOR (GLOVE) ×3 IMPLANT
GOWN STRL REUS W/ TWL LRG LVL3 (GOWN DISPOSABLE) ×1 IMPLANT
GOWN STRL REUS W/ TWL XL LVL3 (GOWN DISPOSABLE) ×3 IMPLANT
GOWN STRL REUS W/TWL 2XL LVL3 (GOWN DISPOSABLE) IMPLANT
GOWN STRL REUS W/TWL LRG LVL3 (GOWN DISPOSABLE) ×2
GOWN STRL REUS W/TWL XL LVL3 (GOWN DISPOSABLE) ×6
KIT BASIN OR (CUSTOM PROCEDURE TRAY) ×3 IMPLANT
KIT TURNOVER KIT B (KITS) ×3 IMPLANT
NEEDLE HYPO 22GX1.5 SAFETY (NEEDLE) ×3 IMPLANT
NEEDLE SPNL 22GX3.5 QUINCKE BK (NEEDLE) ×3 IMPLANT
NS IRRIG 1000ML POUR BTL (IV SOLUTION) ×3 IMPLANT
OIL CARTRIDGE MAESTRO DRILL (MISCELLANEOUS) ×3
PACK LAMINECTOMY NEURO (CUSTOM PROCEDURE TRAY) ×3 IMPLANT
RUBBERBAND STERILE (MISCELLANEOUS) ×6 IMPLANT
SPONGE SURGIFOAM ABS GEL SZ50 (HEMOSTASIS) ×3 IMPLANT
STRIP CLOSURE SKIN 1/2X4 (GAUZE/BANDAGES/DRESSINGS) ×2 IMPLANT
SUT VIC AB 0 CT1 18XCR BRD8 (SUTURE) ×1 IMPLANT
SUT VIC AB 0 CT1 8-18 (SUTURE) ×2
SUT VIC AB 2-0 CT1 18 (SUTURE) ×3 IMPLANT
SUT VICRYL 4-0 PS2 18IN ABS (SUTURE) ×3 IMPLANT
TOWEL GREEN STERILE (TOWEL DISPOSABLE) ×3 IMPLANT
TOWEL GREEN STERILE FF (TOWEL DISPOSABLE) ×3 IMPLANT
WATER STERILE IRR 1000ML POUR (IV SOLUTION) ×3 IMPLANT

## 2019-10-31 NOTE — H&P (Signed)
Tiffany Robles is an 63 y.o. female.   Chief Complaint: Left leg pain HPI: 63 year old female with progressive worsening left leg pain rating down L5 nerve root pattern work-up is revealed severe spinal stenosis at L4-5 on the left.  Patient be refractory to all forms of conservative treatment with anti-inflammatory steroid injections and exercise therapy.  Due to patient progression of clinical syndrome imaging findings showing severe stenosis at L4-5 on the left and failed conservative treatment of recommended decompressive laminotomy at that level.  I extensively gone over the risks and benefits of the preparation with her as well as perioperative course expectations of outcome and alternatives of surgery and she understands and agrees to proceed forward.  Past Medical History:  Diagnosis Date  . Anxiety   . Arthritis   . Cancer ([place])    30 yrs, ago, cervical  . Depression   . Diabetes mellitus without complication ([place])    type 2   . Family history of adverse reaction to anesthesia    mother allergic to Anesthesia drug- had a very bad headache  . Hypertension   . Primary localized osteoarthritis of left knee   . Syncope and collapse    first occurrence 40 years ago , last occurrence 10 years ago , saw cardio for eval , underwent echo and per patient results were normal and was told to f/u with cardio if syncope reoccurs, patient reports no reoccurence of syncope since that time .    Past Surgical History:  Procedure Laterality Date  . ABDOMINAL HYSTERECTOMY    . DILATION AND CURETTAGE OF UTERUS    . TOTAL KNEE ARTHROPLASTY Left 09/26/2019   Procedure: TOTAL KNEE ARTHROPLASTY;  Surgeon: Elsie Saas, MD;  Location: [facility];  Service: Orthopedics;  Laterality: Left;    Family History  Problem Relation Age of Onset  . Diabetes Mother   . Hypertension Mother   . Diabetes Father   . Hypertension Father   . Diabetes Brother   . Hypertension Brother   . Breast cancer Neg Hx     Social History:  reports that she has quit smoking. Her smoking use included cigarettes. She has a 60.00 pack-year smoking history. She has never used smokeless tobacco. She reports current alcohol use. She reports that she does not use drugs.  Allergies:  Allergies  Allergen Reactions  . Penicillins Swelling and Other (See Comments)    Facial swelling Did it involve swelling of the face/tongue/throat, SOB, or low BP? Yes Did it involve sudden or severe rash/hives, skin peeling, or any reaction on the inside of your mouth or nose? No Did you need to seek medical attention at a hospital or doctor's office?    #  #  YES  #  #  When did it last happen?10+ years If all above answers are "NO", may proceed with cephalosporin use.   . Augmentin [Amoxicillin-Pot Clavulanate] Nausea And Vomiting  . Codeine Nausea And Vomiting  . Ivp Dye [Iodinated Diagnostic Agents] Hives and Itching    Just contrast dye , no reactions to other -dines   . Keflex [Cephalexin] Nausea And Vomiting  . Oxycodone     Headaches    Medications Prior to Admission  Medication Sig Dispense Refill  . acetaminophen (TYLENOL) 500 MG tablet Take 1,000 mg by mouth every 6 (six) hours as needed for moderate pain or headache.    Marland Kitchen atorvastatin (LIPITOR) 40 MG tablet Take 40 mg by mouth every evening.     Marland Kitchen  Calcium Carb-Cholecalciferol (CALCIUM 600 + D PO) Take 1 tablet by mouth 2 (two) times daily.    . Dulaglutide (TRULICITY) 1.5 0000000 SOPN Inject 3 mg into the skin every Friday.     Marland Kitchen ELIQUIS 2.5 MG TABS tablet Take 2.5 mg by mouth 2 (two) times daily.    Marland Kitchen glipiZIDE (GLUCOTROL) 5 MG tablet Take 5 mg by mouth 2 (two) times daily.    Marland Kitchen lisinopril-hydrochlorothiazide (ZESTORETIC) 20-25 MG tablet Take 1 tablet by mouth daily.    . metFORMIN (GLUCOPHAGE-XR) 500 MG 24 hr tablet Take 1,000 mg by mouth 2 (two) times daily.    . Multiple Vitamin (MULTIVITAMIN WITH MINERALS) TABS tablet Take 1 tablet by mouth daily.      . mupirocin ointment (BACTROBAN) 2 % Place 1 application into the nose 2 (two) times daily.    Marland Kitchen PARoxetine (PAXIL) 30 MG tablet Take 60 mg by mouth daily.    . predniSONE (DELTASONE) 10 MG tablet Take 10 mg by mouth daily.    Marland Kitchen zolpidem (AMBIEN) 5 MG tablet Take 5 mg by mouth at bedtime.    Marland Kitchen aspirin EC 325 MG EC tablet 1 tab a day for the next 30 days to prevent blood clots (Patient not taking: Reported on 10/25/2019) 30 tablet 0  . celecoxib (CELEBREX) 200 MG capsule Take 200 mg by mouth daily.    Marland Kitchen docusate sodium (COLACE) 100 MG capsule 1 tab 2 times a day while on narcotics.  STOOL SOFTENER (Patient not taking: Reported on 10/25/2019) 60 capsule 0  . gabapentin (NEURONTIN) 300 MG capsule 1 po qhs for nerve pain (Patient not taking: Reported on 10/25/2019) 30 capsule 0  . oxyCODONE (OXY IR/ROXICODONE) 5 MG immediate release tablet 1 po q 4 hrs prn pain.  Left total knee replacement 09/26/2019 (Patient not taking: Reported on 10/25/2019) 30 tablet 0  . polyethylene glycol (MIRALAX / GLYCOLAX) 17 g packet 17grams in 6 oz of something to drink twice a day until bowel movement.  LAXITIVE.  Restart if two days since last bowel movement (Patient not taking: Reported on 10/25/2019) 14 each 0    Results for orders placed or performed during the hospital encounter of 10/31/19 (from the past 48 hour(s))  Glucose, capillary     Status: Abnormal   Collection Time: 10/31/19  5:55 AM  Result Value Ref Range   Glucose-Capillary 335 (H) 70 - 99 mg/dL   Comment 1 Notify RN    Comment 2 Document in Chart    No results found.  Review of Systems  Neurological: Positive for tingling and sensory change.    Blood pressure (!) 189/87, pulse 100, temperature 98 F (36.7 C), temperature source Oral, resp. rate 18, height 5\' 3"  (1.6 m), weight 78.9 kg, SpO2 100 %. Physical Exam  Constitutional: She is oriented to person, place, and time. She appears well-developed.  HENT:  Head: Normocephalic.  Eyes:  Pupils are equal, round, and reactive to light.  GI: Soft.  Neurological: She is alert and oriented to person, place, and time. She has normal strength. GCS eye subscore is 4. GCS verbal subscore is 5. GCS motor subscore is 6.  Strength is 5 out of 5 iliopsoas, quads, hamstrings, gastroc, into tibialis, and EHL.  Skin: Skin is warm and dry.     Assessment/Plan 63 year old female presents for decompressive laminectomy L4-5 on the left.  Sariya Trickey P, MD 10/31/2019, 7:19 AM

## 2019-10-31 NOTE — Anesthesia Postprocedure Evaluation (Signed)
Anesthesia Post Note  Patient: Tiffany Robles  Procedure(s) Performed: Laminectomy and Foraminotomy - left - Lumbar four-Lumbar five (Left Back)     Patient location during evaluation: PACU Anesthesia Type: General Level of consciousness: sedated and patient cooperative Pain management: pain level controlled Vital Signs Assessment: post-procedure vital signs reviewed and stable Respiratory status: spontaneous breathing Cardiovascular status: stable Anesthetic complications: no    Last Vitals:  Vitals:   10/31/19 1005 10/31/19 1028  BP: (!) 157/84 (!) 159/76  Pulse: 97 89  Resp: 12 18  Temp:  37 C  SpO2: 97% 97%    Last Pain:  Vitals:   10/31/19 1028  TempSrc: Oral  PainSc:                  Nolon Nations

## 2019-10-31 NOTE — Progress Notes (Signed)
Pt CBG on arrival 335. Dr. Lissa Hoard aware, will review chart.

## 2019-10-31 NOTE — Anesthesia Procedure Notes (Signed)
Procedure Name: Intubation Date/Time: 10/31/2019 7:50 AM Performed by: Janene Harvey, CRNA Pre-anesthesia Checklist: Patient identified, Emergency Drugs available, Suction available and Patient being monitored Patient Re-evaluated:Patient Re-evaluated prior to induction Oxygen Delivery Method: Circle system utilized Preoxygenation: Pre-oxygenation with 100% oxygen Induction Type: IV induction Ventilation: Mask ventilation without difficulty Laryngoscope Size: Mac and 4 Tube type: Oral Tube size: 7.0 mm Number of attempts: 1 Airway Equipment and Method: Stylet and Oral airway Placement Confirmation: ETT inserted through vocal cords under direct vision,  positive ETCO2 and breath sounds checked- equal and bilateral Secured at: 22 cm Tube secured with: Tape Dental Injury: Teeth and Oropharynx as per pre-operative assessment

## 2019-10-31 NOTE — Progress Notes (Signed)
Inpatient Diabetes Program Recommendations  AACE/ADA: New Consensus Statement on Inpatient Glycemic Control   Target Ranges:  Prepandial:   less than 140 mg/dL      Peak postprandial:   less than 180 mg/dL (1-2 hours)      Critically ill patients:  140 - 180 mg/dL   Results for Tiffany Robles, Tiffany Robles (MRN [id number]) as of 10/31/2019 19:44  Ref. Range 10/31/2019 05:55 10/31/2019 08:32 10/31/2019 09:06 10/31/2019 11:55 10/31/2019 16:00  Glucose-Capillary Latest Ref Range: 70 - 99 mg/dL 335 (H)  Novolog 15 units 284 (H) 260 (H) 354 (H)  Novolog 19 units  Glipizide 5 mg 131 (H)  Novolog 6 units  Metformin 1000 mg  Results for Tiffany Robles, Tiffany Robles (MRN [id number]) as of 10/31/2019 19:44  Ref. Range 09/26/2019 11:23  Hemoglobin A1C Latest Ref Range: 4.8 - 5.6 % 7.8 (H)   Review of Glycemic Control  Diabetes history: DM2 Outpatient Diabetes medications: Trulicity 3 mg Qweek (Friday), Metformin 1000 mg BID, Glipizide 5 mg BID Current orders for Inpatient glycemic control: Novolog 0-15 units TID with meals, Novolog 0-5 units QHS, Novolog 4 units TID with meals, Metformin 1000 mg BID, Glipizide 5 mg BID  Note: Noted consult for Diabetes Coordinator.  Chart reviewed. Initial glucose 335 mg/dl today at 5:55 am and pt was given Novolog 15 units and glucose 260 mg/dl at 9:06 am today.Then glucose back up to 354 mg/dl at 11:55 today and given Novolog 19 units as well as Glipizide 5 mg.  Glucose improved down to 131 mg/dl at 16:00 today.  A1C 7.8% on  09/26/19 indicating an average glucose of 177 mg/dl.   Agree with current orders at this time.  Will continue to follow.  Thanks, Barnie Alderman, RN, MSN, CDE Diabetes Coordinator Inpatient Diabetes Program [phone] (Team Pager from 8am to 5pm)

## 2019-10-31 NOTE — Op Note (Signed)
Preoperative diagnosis: Left L5 radiculopathy from lumbar spinal stenosis grade 1 spondylolisthesis at L4-5  Postoperative diagnosis: Same  Procedure: Decompressive lumbar laminectomy L4-5 on the left with microdissection and microscopic foraminotomies of the L5 nerve root.  Surgeon: Dominica Severin Jensyn Cambria  Assistant: Nash Shearer  Anesthesia: General  EBL: Minimal  HPI: 63 year old female who presents with left L5 radicular symptoms work-up revealed severe spinal stenosis possible synovial cyst versus facet spur with compression of the left L5 nerve root.  Due to patient's progression of clinical syndrome imaging findings and failed conservative treatment I recommended decompressive laminectomy L4-5 on the left I extensively went over the risks and benefits of that procedure with her as well as perioperative course expectations of outcome and alternatives of surgery and she understood and agreed to proceed forward.  Preoperative CBG levels were elevated in the low 300s patient been this way for a month she had prior previous knee surgery she had epidural steroid injection she had been on oral steroids.  We discussed risks of increased possibility of wound infection with poorly controlled diabetes however due to the patient's escalating level of pain significant disability ongoing pain medicine needs and is glad emotional stress brought on by her pain syndrome we elected to proceed forward with plans on incorporating diabetic coordinator postoperative and close follow-up with primary care to make monitoring adjustments to her oral antidiabetics.  She and I extensively talked about this increased risk and she really wanted to proceed forward understanding the risks.  Operative procedure: Patient brought into the OR was induced under general anesthesia and positioned prone the Wilson frame her back was prepped and draped in routine sterile fashion preoperative x-ray localized the appropriate level so after  infiltration of 10 cc lidocaine with epi midline incision was made and Bovie letter cautery was used take down subcu tissue and subperiosteal dissection carried lamina of initially L3-4 confirmed by intraoperative x-ray and so I removed 1 laminar space below this at L4-5.  Then the inferior aspect the low L4 medial facet complex superior aspect of lamina of L5 was drilled out with a high-speed drill laminotomy was begun with a 3 and 4 Miller Kerrison punch.  Ligament flow was identified and removed in piecemeal fashion.  The operative microscope was draped and brought into the field and the microscope illumination under biting the medial gutter removed what looked like a degenerative synovial cyst as well as a large spur coming off the medial facet complex this was causing severe compression and densely adherent to the L5 nerve root after it was teased off of the L5 nerve removed the L5 foramen was widely decompressed.  Marching superiorly I also identified the undersurface of the L4 foramen and decompress that.  This space was identified and epidural veins were coagulated was felt not to be is causing a significant compression was bulging and spondylitic but not ruptured.  At the end decompression there is no further stenosis a coronary dilator easily passed out the foramen of the L5 and L4 nerve roots.  Wounds are copious irrigated to Kassim states was maintained Gelfoam was overlaid top of the dura the muscle fascia approximate layers with empty Vicryl skin was closed running 4 subcuticular Dermabond benzoin Steri-Strips and a sterile dressing was applied patient recovery in stable condition.  At the end the case all needle count sponge counts were correct.  My assistant Joelene Millin was present during the entirety of decompressing the thecal sac and L5 nerve root and assisted in retraction and  closure.

## 2019-10-31 NOTE — Transfer of Care (Signed)
Immediate Anesthesia Transfer of Care Note  Patient: Tiffany Robles  Procedure(s) Performed: Laminectomy and Foraminotomy - left - Lumbar four-Lumbar five (Left Back)  Patient Location: PACU  Anesthesia Type:General  Level of Consciousness: awake  Airway & Oxygen Therapy: Patient Spontanous Breathing and Patient connected to face mask oxygen  Post-op Assessment: Report given to RN and Post -op Vital signs reviewed and stable  Post vital signs: Reviewed  Last Vitals:  Vitals Value Taken Time  BP 151/89 10/31/19 0906  Temp    Pulse 107 10/31/19 0909  Resp 16 10/31/19 0909  SpO2 98 % 10/31/19 0909  Vitals shown include unvalidated device data.  Last Pain:  Vitals:   10/31/19 0605  TempSrc:   PainSc: 8       Patients Stated Pain Goal: 3 ([id number])  Complications: No apparent anesthesia complications. Pt moving extremities x4.

## 2019-11-01 ENCOUNTER — Encounter (HOSPITAL_COMMUNITY): Payer: Self-pay | Admitting: Neurosurgery

## 2019-11-01 DIAGNOSIS — M48061 Spinal stenosis, lumbar region without neurogenic claudication: Secondary | ICD-10-CM | POA: Diagnosis not present

## 2019-11-01 LAB — GLUCOSE, CAPILLARY: Glucose-Capillary: 273 mg/dL — ABNORMAL HIGH (ref 70–99)

## 2019-11-01 NOTE — Progress Notes (Signed)
Inpatient Diabetes Program Recommendations  AACE/ADA: New Consensus Statement on Inpatient Glycemic Control   Target Ranges:  Prepandial:   less than 140 mg/dL      Peak postprandial:   less than 180 mg/dL (1-2 hours)H)      Critically ill patients:  140 - 180 mg/dL   Results for Tiffany Robles, Tiffany Robles (MRN [id number]) as of 11/01/2019 08:43  Ref. Range 10/31/2019 05:55 10/31/2019 08:32 10/31/2019 09:06 10/31/2019 11:55 10/31/2019 16:00 10/31/2019 20:57 11/01/2019 06:29  Glucose-Capillary Latest Ref Range: 70 - 99 mg/dL 335 (H) 284 (H) 260 (H) 354 (H) 131 (H) 279 (H) 273 (H)   Results for Tiffany Robles, Tiffany Robles (MRN [id number]) as of 11/01/2019 08:43  Ref. Range 09/16/2019 12:37 09/26/2019 11:23  Hemoglobin A1C Latest Ref Range: 4.8 - 5.6 % 7.7 (H) 7.8 (H)  Review of Glycemic Control  Diabetes history: DM2 Outpatient Diabetes medications: Trulicity 3 mg Qweek (Friday), Metformin 1000 mg BID, Glipizide 5 mg BID Current orders for Inpatient glycemic control: Novolog 0-15 units TID with meals, Novolog 0-5 units QHS, Novolog 4 units TID with meals, Metformin 1000 mg BID, Glipizide 5 mg BID   NOTE: Spoke with patient over the phone (diabetes coordinator working remotely) about diabetes and home regimen for diabetes control. Patient reports being followed by PCP for diabetes management and currently taking Trulicity 3 mg Qweek (Friday), Metformin 1000 mg BID, and Glipizide 5 mg BID as an outpatient for diabetes control. Patient reports taking DM medications as prescribed and reports that no changes were made with DM medications recently.  Patient reports glucose has ranged from 97- 200 mg/dl recently. Patient states that she has recently been on Prednisone (just finished a few days ago) and she has also had to get steroid injections. Patient had knee surgery on 09/26/19 and just had back surgery 10/31/19, and she reports she has been in a lot of pain. Recent steroids and pain have been contributing to hyperglycemia.  Discussed A1C results (7.8% on 09/26/19 ) and explained that current A1C indicates an average glucose of 177 mg/dl over the past 2-3 months. Discussed glucose and A1C goals. Discussed importance of checking CBGs and maintaining good CBG control to prevent long-term and short-term complications. Explained how hyperglycemia leads to damage within blood vessels which lead to the common complications seen with uncontrolled diabetes. Patient states she was not aware of how hyperglycemia damages blood vessels and leads to complications. Patient is very concerned about DM control as she has had family members that have experienced complications from poor DM control and she does not want to experience further complications from poor glycemic control.  Stressed to the patient the importance of glycemic control especially following surgery to prevent further complications. Encouraged patient to continue checking glucose at home and to reach out to her PCP if glucose is consistently over 180 mg/dl so PCP can advise her on what she needs to do to improve glycemic control.  Patient verbalized understanding of information discussed and reports no further questions at this time related to diabetes.  Thanks, Barnie Alderman, RN, MSN, CDE Diabetes Coordinator Inpatient Diabetes Program [phone] (Team Pager)

## 2019-11-01 NOTE — Discharge Summary (Signed)
Physician Discharge Summary  Patient ID: Tiffany Robles MRN: [id number] DOB/AGE: [birth year] 63 y.o. Estimated body mass index is 30.82 kg/m as calculated from the following:   Height as of this encounter: 5\' 3"  (1.6 m).   Weight as of this encounter: 78.9 kg.   Admit date: 10/31/2019 Discharge date: 11/01/2019  Admission Diagnoses: Left L5 radiculopathy from lumbar spinal stenosis L4-5 left  Discharge Diagnoses: Same Active Problems:   Spinal stenosis at L4-L5 level   Discharged Condition: good  Hospital Course: Patient is admitted to hospital underwent decompressive laminotomy L4-5 and the left and postoperative patient did very well recovering floor ambulating and voiding spontaneously tolerating regular diet and stable for discharge home.  Consults: Significant Diagnostic Studies: Treatments: Decompressive laminotomy L4-5 left Discharge Exam: Blood pressure (!) 121/54, pulse 90, temperature 98 F (36.7 C), temperature source Oral, resp. rate 20, height 5\' 3"  (1.6 m), weight 78.9 kg, SpO2 97 %. Strength 5-5 wound clean dry and intact  Disposition: Home   Allergies as of 11/01/2019      Reactions   Penicillins Swelling, Other (See Comments)   Facial swelling Did it involve swelling of the face/tongue/throat, SOB, or low BP? Yes Did it involve sudden or severe rash/hives, skin peeling, or any reaction on the inside of your mouth or nose? No Did you need to seek medical attention at a hospital or doctor's office?    #  #  YES  #  #  When did it last happen?10+ years If all above answers are "NO", may proceed with cephalosporin use.   Augmentin [amoxicillin-pot Clavulanate] Nausea And Vomiting   Codeine Nausea And Vomiting   Ivp Dye [iodinated Diagnostic Agents] Hives, Itching   Just contrast dye , no reactions to other -dines    Keflex [cephalexin] Nausea And Vomiting   Oxycodone    Headaches      Medication List    TAKE these medications   acetaminophen 500  MG tablet Commonly known as: TYLENOL Take 1,000 mg by mouth every 6 (six) hours as needed for moderate pain or headache.   aspirin 325 MG EC tablet 1 tab a day for the next 30 days to prevent blood clots   atorvastatin 40 MG tablet Commonly known as: LIPITOR Take 40 mg by mouth every evening.   CALCIUM 600 + D PO Take 1 tablet by mouth 2 (two) times daily.   celecoxib 200 MG capsule Commonly known as: CELEBREX Take 200 mg by mouth daily.   docusate sodium 100 MG capsule Commonly known as: COLACE 1 tab 2 times a day while on narcotics.  STOOL SOFTENER   Eliquis 2.5 MG Tabs tablet Generic drug: apixaban Take 2.5 mg by mouth 2 (two) times daily.   gabapentin 300 MG capsule Commonly known as: NEURONTIN 1 po qhs for nerve pain   glipiZIDE 5 MG tablet Commonly known as: GLUCOTROL Take 5 mg by mouth 2 (two) times daily.   lisinopril-hydrochlorothiazide 20-25 MG tablet Commonly known as: ZESTORETIC Take 1 tablet by mouth daily.   metFORMIN 500 MG 24 hr tablet Commonly known as: GLUCOPHAGE-XR Take 1,000 mg by mouth 2 (two) times daily.   multivitamin with minerals Tabs tablet Take 1 tablet by mouth daily.   mupirocin ointment 2 % Commonly known as: BACTROBAN Place 1 application into the nose 2 (two) times daily.   oxyCODONE 5 MG immediate release tablet Commonly known as: Oxy IR/ROXICODONE 1 po q 4 hrs prn pain.  Left total knee replacement 09/26/2019  PARoxetine 30 MG tablet Commonly known as: PAXIL Take 60 mg by mouth daily.   polyethylene glycol 17 g packet Commonly known as: MIRALAX / GLYCOLAX 17grams in 6 oz of something to drink twice a day until bowel movement.  LAXITIVE.  Restart if two days since last bowel movement   predniSONE 10 MG tablet Commonly known as: DELTASONE Take 10 mg by mouth daily.   Trulicity 1.5 0000000 Sopn Generic drug: Dulaglutide Inject 3 mg into the skin every Friday.   zolpidem 5 MG tablet Commonly known as: AMBIEN Take 5  mg by mouth at bedtime.        Signed: Sherrian Nunnelley P 11/01/2019, 7:48 AM

## 2019-11-01 NOTE — Plan of Care (Signed)
Pt doing well. Pt given D/C instructions with verbal understanding. Rx's were sent to pharmacy by MD. Pt's incision is clean and dry with no sig of infection. Pt's IV was removed prior to D/C. Pt D/C'd home via wheelchair per MD order. Pt is stable @ D/C and has no other needs at this time. Holli Humbles, RN

## 2019-11-30 ENCOUNTER — Ambulatory Visit: Payer: Medicare HMO | Attending: Internal Medicine

## 2019-11-30 DIAGNOSIS — Z20822 Contact with and (suspected) exposure to covid-19: Secondary | ICD-10-CM

## 2019-12-02 LAB — NOVEL CORONAVIRUS, NAA: SARS-CoV-2, NAA: DETECTED — AB

## 2019-12-27 ENCOUNTER — Ambulatory Visit: Payer: Medicare HMO | Attending: Internal Medicine

## 2019-12-27 DIAGNOSIS — Z20822 Contact with and (suspected) exposure to covid-19: Secondary | ICD-10-CM

## 2019-12-28 LAB — NOVEL CORONAVIRUS, NAA: SARS-CoV-2, NAA: NOT DETECTED

## 2019-12-30 ENCOUNTER — Other Ambulatory Visit: Payer: Self-pay

## 2019-12-30 ENCOUNTER — Emergency Department (HOSPITAL_COMMUNITY): Payer: Medicare HMO

## 2019-12-30 ENCOUNTER — Emergency Department (HOSPITAL_COMMUNITY)
Admission: EM | Admit: 2019-12-30 | Discharge: 2019-12-30 | Disposition: A | Discharge status: Home / Self Care | Payer: Medicare HMO | Attending: Emergency Medicine | Admitting: Emergency Medicine

## 2019-12-30 ENCOUNTER — Emergency Department (HOSPITAL_BASED_OUTPATIENT_CLINIC_OR_DEPARTMENT_OTHER)
Admit: 2019-12-30 | Discharge: 2019-12-30 | Disposition: A | Discharge status: Home / Self Care | Payer: Medicare HMO | Attending: Emergency Medicine | Admitting: Emergency Medicine

## 2019-12-30 ENCOUNTER — Encounter (HOSPITAL_COMMUNITY): Payer: Self-pay | Admitting: Emergency Medicine

## 2019-12-30 DIAGNOSIS — M7989 Other specified soft tissue disorders: Secondary | ICD-10-CM | POA: Diagnosis not present

## 2019-12-30 DIAGNOSIS — Z87891 Personal history of nicotine dependence: Secondary | ICD-10-CM | POA: Insufficient documentation

## 2019-12-30 DIAGNOSIS — R2242 Localized swelling, mass and lump, left lower limb: Secondary | ICD-10-CM | POA: Diagnosis not present

## 2019-12-30 DIAGNOSIS — Z7901 Long term (current) use of anticoagulants: Secondary | ICD-10-CM | POA: Insufficient documentation

## 2019-12-30 DIAGNOSIS — I10 Essential (primary) hypertension: Secondary | ICD-10-CM | POA: Insufficient documentation

## 2019-12-30 DIAGNOSIS — Z96652 Presence of left artificial knee joint: Secondary | ICD-10-CM | POA: Insufficient documentation

## 2019-12-30 DIAGNOSIS — M25462 Effusion, left knee: Secondary | ICD-10-CM | POA: Insufficient documentation

## 2019-12-30 DIAGNOSIS — Z79899 Other long term (current) drug therapy: Secondary | ICD-10-CM | POA: Diagnosis not present

## 2019-12-30 DIAGNOSIS — M25562 Pain in left knee: Secondary | ICD-10-CM | POA: Diagnosis present

## 2019-12-30 NOTE — ED Notes (Signed)
US at bedside

## 2019-12-30 NOTE — ED Notes (Signed)
Pt verbalizes understanding of DC instructions. Pt belongings returned and is ambulatory out of ED.  

## 2019-12-30 NOTE — ED Triage Notes (Signed)
Pt states she had L knee total replacement 10/26 by Dr. Noemi Chapel and on Christmas Eve fell on that knee. States she got Covid after that and has not been able to get into the office to have the knee checked. L knee is significantly swollen. Alert and oriented.

## 2019-12-30 NOTE — ED Notes (Signed)
Pt dressed in gown

## 2019-12-30 NOTE — ED Notes (Signed)
X-ray at bedside

## 2019-12-30 NOTE — ED Provider Notes (Signed)
[facility] DEPT Provider Note   CSN: [id number] Arrival date & time: 12/30/19  1153     History Chief Complaint  Patient presents with  . Knee Injury    Tiffany Robles is a 64 y.o. female.  64 year old female with prior medical history as detailed below presents for evaluation of left knee pain.  Patient reports a replacement in October 2020.  She reports a fall onto the left knee at Christmas of 2020.  She reports that she acquired Covid in the last week of December.  Her course of Covid infection was fairly uncomplicated and did not require admission to the hospital.  She did however miss several appointments with her orthopedic doctor.  She presents today complaining of vague pain to the lateral aspect of the left knee.  She is ambulatory without difficulty.  She also complains of some mild swelling of the left calf.  She denies any other specific complaint.  The history is provided by the patient and medical records.  Illness Location:  Left knee pain and left calf swelling Severity:  Mild Onset quality:  Gradual Duration:  1 month Progression:  Unchanged Chronicity:  New Associated symptoms: no chest pain, no fever and no shortness of breath        Past Medical History:  Diagnosis Date  . Anxiety   . Arthritis   . Cancer ([place])    30 yrs, ago, cervical  . Depression   . Diabetes mellitus without complication ([place])    type 2   . Family history of adverse reaction to anesthesia    mother allergic to Anesthesia drug- had a very bad headache  . Hypertension   . Primary localized osteoarthritis of left knee   . Syncope and collapse    first occurrence 40 years ago , last occurrence 10 years ago , saw cardio for eval , underwent echo and per patient results were normal and was told to f/u with cardio if syncope reoccurs, patient reports no reoccurence of syncope since that time .    Patient Active Problem List   Diagnosis Date Noted  .  Spinal stenosis at L4-L5 level 10/31/2019  . Primary localized osteoarthritis of left knee   . Hypertension   . Diabetes mellitus without complication ([place])   . Anxiety     Past Surgical History:  Procedure Laterality Date  . ABDOMINAL HYSTERECTOMY    . DILATION AND CURETTAGE OF UTERUS    . LUMBAR LAMINECTOMY/DECOMPRESSION MICRODISCECTOMY Left 10/31/2019   Procedure: Laminectomy and Foraminotomy - left - Lumbar four-Lumbar five;  Surgeon: Kary Kos, MD;  Location: [facility];  Service: Neurosurgery;  Laterality: Left;  . TOTAL KNEE ARTHROPLASTY Left 09/26/2019   Procedure: TOTAL KNEE ARTHROPLASTY;  Surgeon: Elsie Saas, MD;  Location: [facility];  Service: Orthopedics;  Laterality: Left;     OB History    Gravida  6   Para      Term      Preterm      AB  2   Living  4     SAB  2   TAB      Ectopic      Multiple      Live Births  4           Family History  Problem Relation Age of Onset  . Diabetes Mother   . Hypertension Mother   . Diabetes Father   . Hypertension Father   . Diabetes Brother   .  Hypertension Brother   . Breast cancer Neg Hx     Social History   Tobacco Use  . Smoking status: Former Smoker    Packs/day: 3.00    Years: 20.00    Pack years: 60.00    Types: Cigarettes  . Smokeless tobacco: Never Used  Substance Use Topics  . Alcohol use: Yes    Comment: occassionally  . Drug use: Never    Home Medications Prior to Admission medications   Medication Sig Start Date End Date Taking? Authorizing Provider  acetaminophen (TYLENOL) 500 MG tablet Take 1,000 mg by mouth every 6 (six) hours as needed for moderate pain or headache.    [provider]  aspirin EC 325 MG EC tablet 1 tab a day for the next 30 days to prevent blood clots Patient not taking: Reported on 10/25/2019 09/27/19   Shepperson, Kirstin, PA-C  atorvastatin (LIPITOR) 40 MG tablet Take 40 mg by mouth every evening.     [provider]  Calcium  Carb-Cholecalciferol (CALCIUM 600 + D PO) Take 1 tablet by mouth 2 (two) times daily.    [provider]  celecoxib (CELEBREX) 200 MG capsule Take 200 mg by mouth daily. 10/10/19   [provider]  docusate sodium (COLACE) 100 MG capsule 1 tab 2 times a day while on narcotics.  STOOL SOFTENER Patient not taking: Reported on 10/25/2019 09/27/19   Shepperson, Kirstin, PA-C  Dulaglutide (TRULICITY) 1.5 0000000 SOPN Inject 3 mg into the skin every Friday.     [provider]  ELIQUIS 2.5 MG TABS tablet Take 2.5 mg by mouth 2 (two) times daily. 10/04/19   [provider]  gabapentin (NEURONTIN) 300 MG capsule 1 po qhs for nerve pain Patient not taking: Reported on 10/25/2019 09/27/19   Shepperson, Kirstin, PA-C  glipiZIDE (GLUCOTROL) 5 MG tablet Take 5 mg by mouth 2 (two) times daily.    [provider]  lisinopril-hydrochlorothiazide (ZESTORETIC) 20-25 MG tablet Take 1 tablet by mouth daily.    [provider]  metFORMIN (GLUCOPHAGE-XR) 500 MG 24 hr tablet Take 1,000 mg by mouth 2 (two) times daily.    [provider]  Multiple Vitamin (MULTIVITAMIN WITH MINERALS) TABS tablet Take 1 tablet by mouth daily.     [provider]  mupirocin ointment (BACTROBAN) 2 % Place 1 application into the nose 2 (two) times daily. 09/16/19   [provider]  oxyCODONE (OXY IR/ROXICODONE) 5 MG immediate release tablet 1 po q 4 hrs prn pain.  Left total knee replacement 09/26/2019 Patient not taking: Reported on 10/25/2019 09/27/19   Shepperson, Kirstin, PA-C  PARoxetine (PAXIL) 30 MG tablet Take 60 mg by mouth daily.    [provider]  polyethylene glycol (MIRALAX / GLYCOLAX) 17 g packet 17grams in 6 oz of something to drink twice a day until bowel movement.  LAXITIVE.  Restart if two days since last bowel movement Patient not taking: Reported on 10/25/2019 09/27/19   Shepperson, Kirstin, PA-C  predniSONE (DELTASONE) 10 MG tablet  Take 10 mg by mouth daily. 10/15/19   [provider]  zolpidem (AMBIEN) 5 MG tablet Take 5 mg by mouth at bedtime.    [provider]  diphenhydrAMINE (BENADRYL) 25 MG tablet Take 25 mg by mouth daily as needed for allergies.  08/16/19  [provider]    Allergies    Penicillins, Augmentin [amoxicillin-pot clavulanate], Codeine, Ivp dye [iodinated diagnostic agents], Keflex [cephalexin], and Oxycodone  Review of Systems  Review of Systems  Constitutional: Negative for fever.  Respiratory: Negative for shortness of breath.   Cardiovascular: Negative for chest pain.  All other systems reviewed and are negative.   Physical Exam Updated Vital Signs BP (!) 161/86 (BP Location: Right Arm)   Pulse 98   Temp 97.8 F (36.6 C) (Oral)   Resp 18   Ht 5\' 3"  (1.6 m)   SpO2 98%   BMI 30.82 kg/m   Physical Exam Vitals and nursing note reviewed.  Constitutional:      General: She is not in acute distress.    Appearance: Normal appearance. She is well-developed.  HENT:     Head: Normocephalic and atraumatic.  Eyes:     Conjunctiva/sclera: Conjunctivae normal.     Pupils: Pupils are equal, round, and reactive to light.  Cardiovascular:     Rate and Rhythm: Normal rate and regular rhythm.     Heart sounds: Normal heart sounds.  Pulmonary:     Effort: Pulmonary effort is normal. No respiratory distress.     Breath sounds: Normal breath sounds.  Abdominal:     General: There is no distension.     Palpations: Abdomen is soft.     Tenderness: There is no abdominal tenderness.  Musculoskeletal:        General: No deformity. Normal range of motion.     Cervical back: Normal range of motion and neck supple.     Comments: Left knee is without erythema or significant edema.  Full active range of motion noted.  Patient is ambulatory without antalgic gait.  No significant edema or tenderness noted in the distal left lower extremity.  Distal left lower extremity is  neurovascular intact.  Skin:    General: Skin is warm and dry.  Neurological:     Mental Status: She is alert and oriented to person, place, and time.     ED Results / Procedures / Treatments   Labs (all labs ordered are listed, but only abnormal results are displayed) Labs Reviewed - No data to display  EKG None  Radiology DG Knee 2 Views Left  Result Date: 12/30/2019 CLINICAL DATA:  Fall, pain EXAM: LEFT KNEE - 1-2 VIEW COMPARISON:  06/07/2019 FINDINGS: Interval left total knee arthroplasty. Joint effusion is present. There is no acute fracture. IMPRESSION: No acute fracture.  Joint effusion is present. Electronically Signed   By: Macy Mis M.D.   On: 12/30/2019 13:23   VAS [place] LOWER EXTREMITY VENOUS (DVT) ([facility] 7a-7p)  Result Date: 12/30/2019  Lower Venous Study Indications: Swelling.  Risk Factors: None identified. Limitations: Poor ultrasound/tissue interface and patient positioning. Comparison Study: No prior studies. Performing Technologist: Oliver Hum RVT  Examination Guidelines: A complete evaluation includes B-mode imaging, spectral Doppler, color Doppler, and power Doppler as needed of all accessible portions of each vessel. Bilateral testing is considered an integral part of a complete examination. Limited examinations for reoccurring indications may be performed as noted.  +-----+---------------+---------+-----------+----------+--------------+ RIGHTCompressibilityPhasicitySpontaneityPropertiesThrombus Aging +-----+---------------+---------+-----------+----------+--------------+ CFV  Full           Yes      Yes                                 +-----+---------------+---------+-----------+----------+--------------+   +---------+---------------+---------+-----------+----------+--------------+ LEFT     CompressibilityPhasicitySpontaneityPropertiesThrombus Aging +---------+---------------+---------+-----------+----------+--------------+ CFV      Full            Yes  Yes                                 +---------+---------------+---------+-----------+----------+--------------+ SFJ      Full                                                        +---------+---------------+---------+-----------+----------+--------------+ FV Prox  Full                                                        +---------+---------------+---------+-----------+----------+--------------+ FV Mid   Full                                                        +---------+---------------+---------+-----------+----------+--------------+ FV DistalFull                                                        +---------+---------------+---------+-----------+----------+--------------+ PFV      Full                                                        +---------+---------------+---------+-----------+----------+--------------+ POP      Full           Yes      Yes                                 +---------+---------------+---------+-----------+----------+--------------+ PTV      Full                                                        +---------+---------------+---------+-----------+----------+--------------+ PERO     Full                                                        +---------+---------------+---------+-----------+----------+--------------+     Summary: Right: No evidence of common femoral vein obstruction. Left: There is no evidence of deep vein thrombosis in the lower extremity. No cystic structure found in the popliteal fossa.  *See table(s) above for measurements and observations.    Preliminary     Procedures Procedures (including critical care time)  Medications Ordered in ED Medications - No data to display  ED Course  I have reviewed the triage vital signs and  the nursing notes.  Pertinent labs & imaging results that were available during my care of the patient were reviewed by me and considered in my  medical decision making (see chart for details).    MDM Rules/Calculators/A&P                     MDM  Screen complete  Tiffany Robles was evaluated in Emergency Department on 12/30/2019 for the symptoms described in the history of present illness. She was evaluated in the context of the global COVID-19 pandemic, which necessitated consideration that the patient might be at risk for infection with the SARS-CoV-2 virus that causes COVID-19. Institutional protocols and algorithms that pertain to the evaluation of patients at risk for COVID-19 are in a state of rapid change based on information released by regulatory bodies including the CDC and federal and state organizations. These policies and algorithms were followed during the patient's care in the ED.  Patient is presenting for evaluation of her left knee which is status post left total knee replacement.  Exam does not suggest significant acute pathology such as infection or fracture.  Plain films do not demonstrate fracture or displacement of the implant.  Ultrasound does not reveal evidence of DVT in the left lower extremity.  Patient is encouraged to closely follow-up with her primary orthopedic provider.  Importance of close follow-up is repeatedly stressed.  Strict return cautions given and understood. Final Clinical Impression(s) / ED Diagnoses Final diagnoses:  Knee effusion, left    Rx / DC Orders ED Discharge Orders    None       Valarie Merino, MD 12/30/19 1356

## 2019-12-30 NOTE — Progress Notes (Signed)
Left lower extremity venous duplex has been completed. Preliminary results can be found in CV Proc through chart review.  Results were given to Dr. Francia Greaves.  12/30/19 12:57 PM Carlos Levering RVT

## 2019-12-30 NOTE — Discharge Instructions (Addendum)
Return for any problem.  Follow-up with your orthopedic provider today as instructed.

## 2020-02-11 IMAGING — MG DIGITAL SCREENING BILATERAL MAMMOGRAM WITH TOMO AND CAD
8 series · 8 of 24 positions shown · non-contrast
Comparison: None.

CLINICAL DATA: Screening.

EXAM:
DIGITAL SCREENING BILATERAL MAMMOGRAM WITH TOMO AND CAD

[R CC synth-2D]
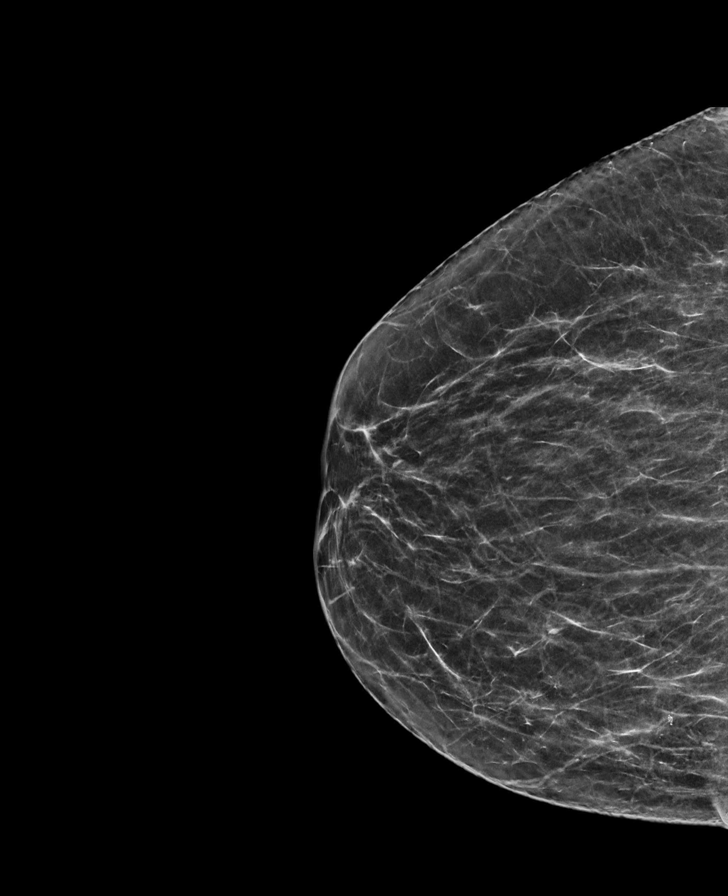

[R MLO synth-2D]
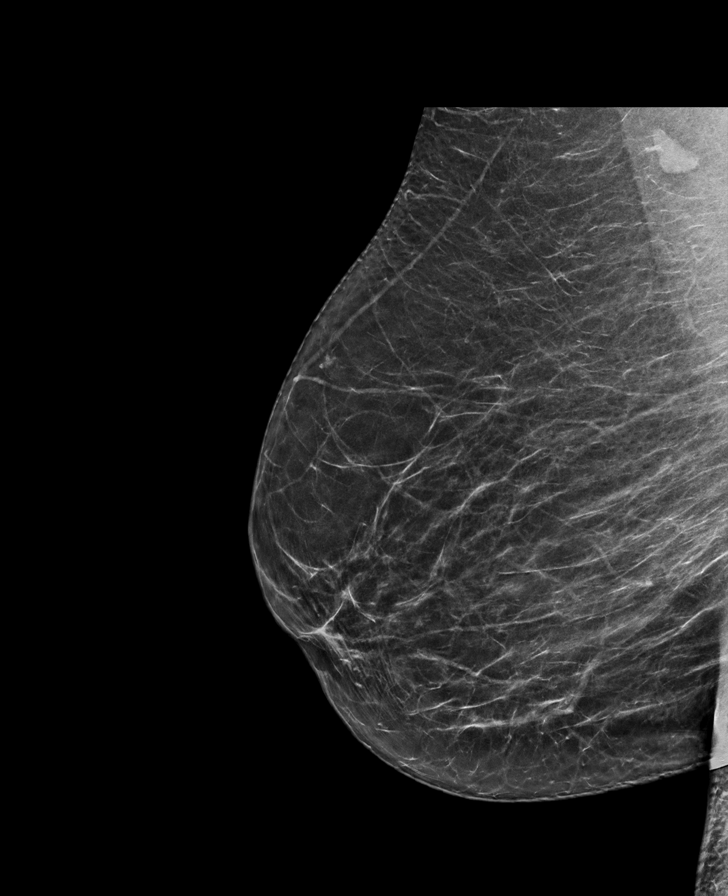

[L CC synth-2D]
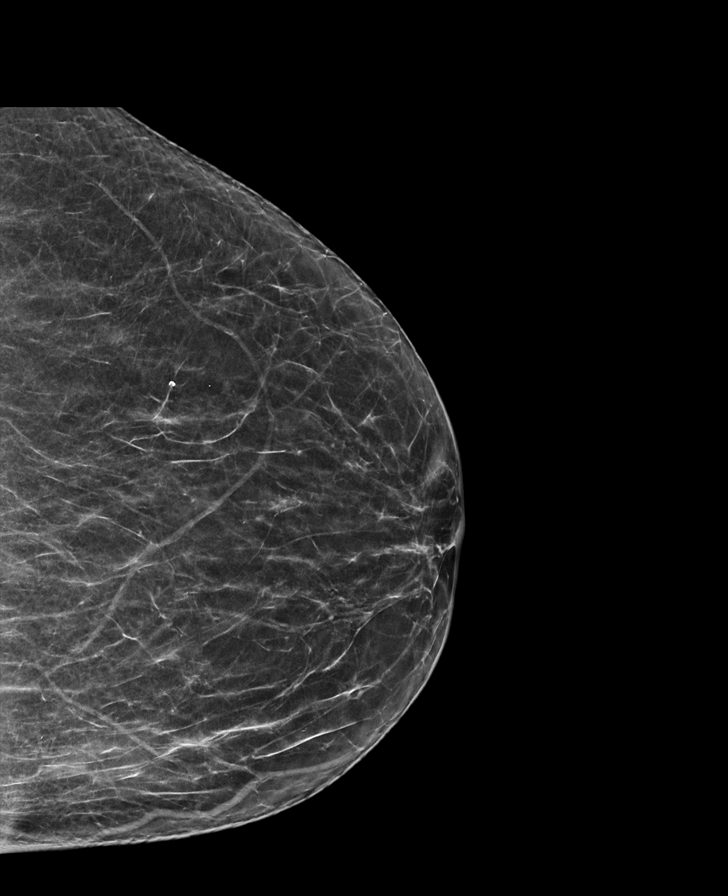

[L MLO synth-2D]
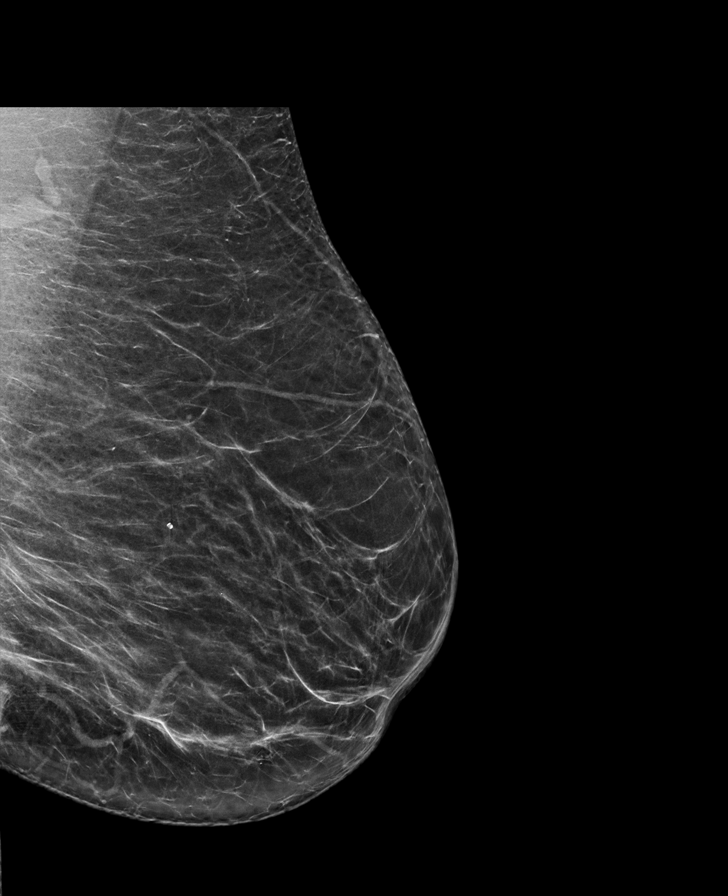

[R CC tomo · tomo slice 29/57.0]
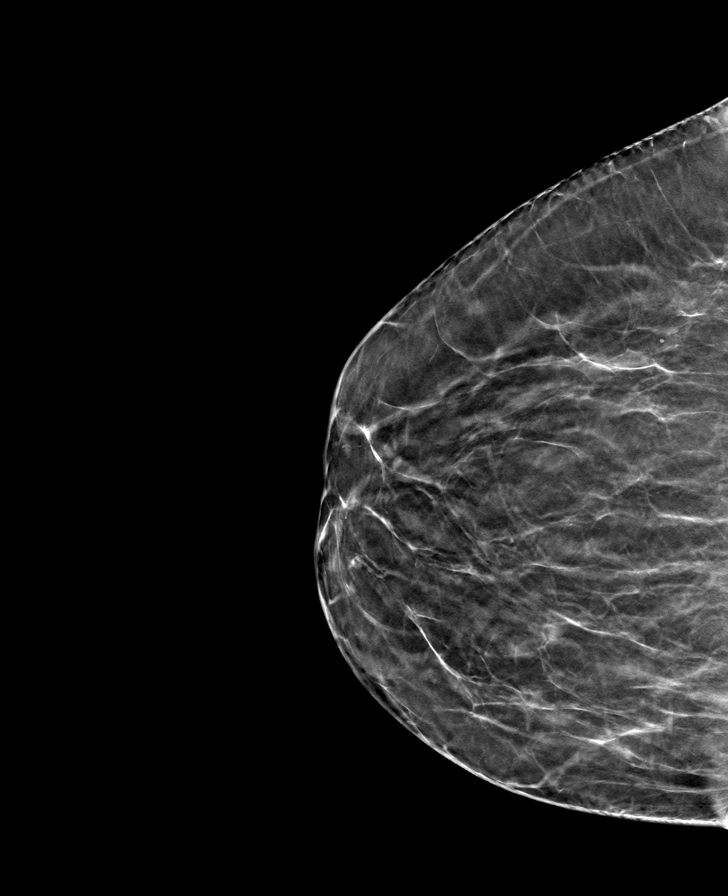

[R MLO tomo · tomo slice 33/66.0]
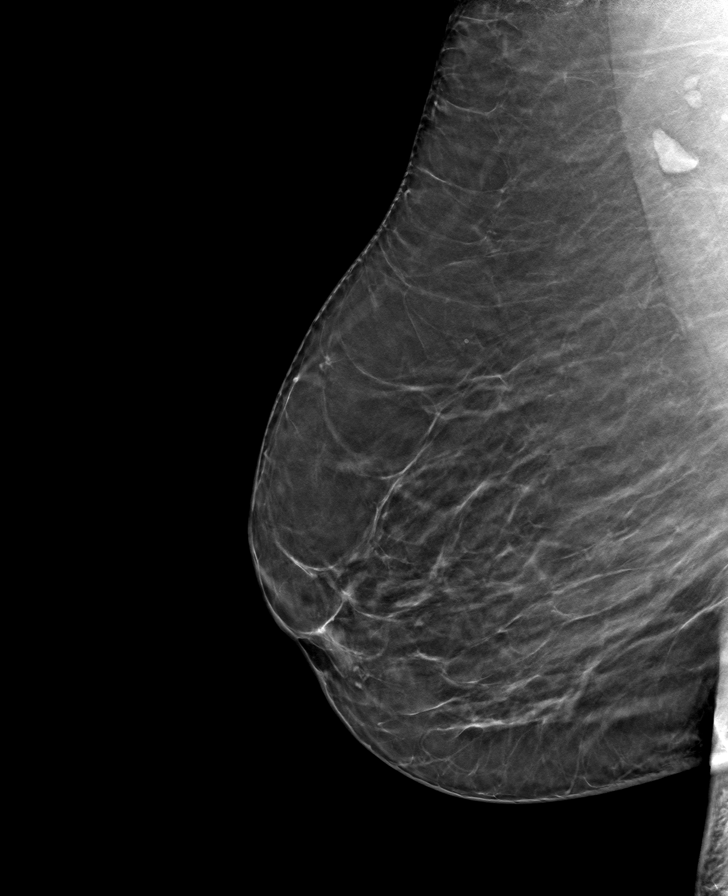

[L CC tomo · tomo slice 29/58.0]
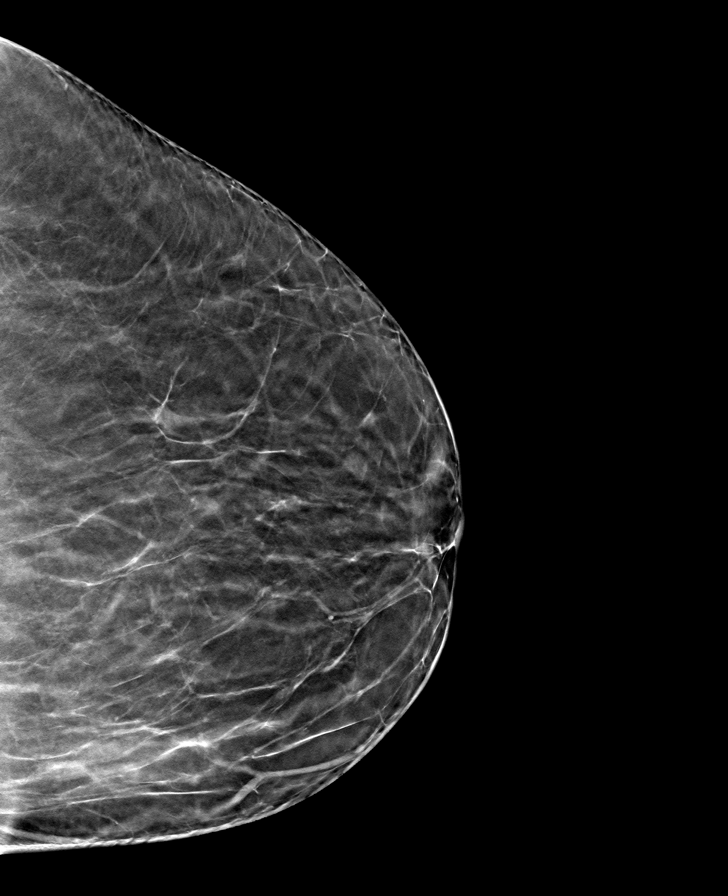

[L MLO tomo · tomo slice 36/71.0]
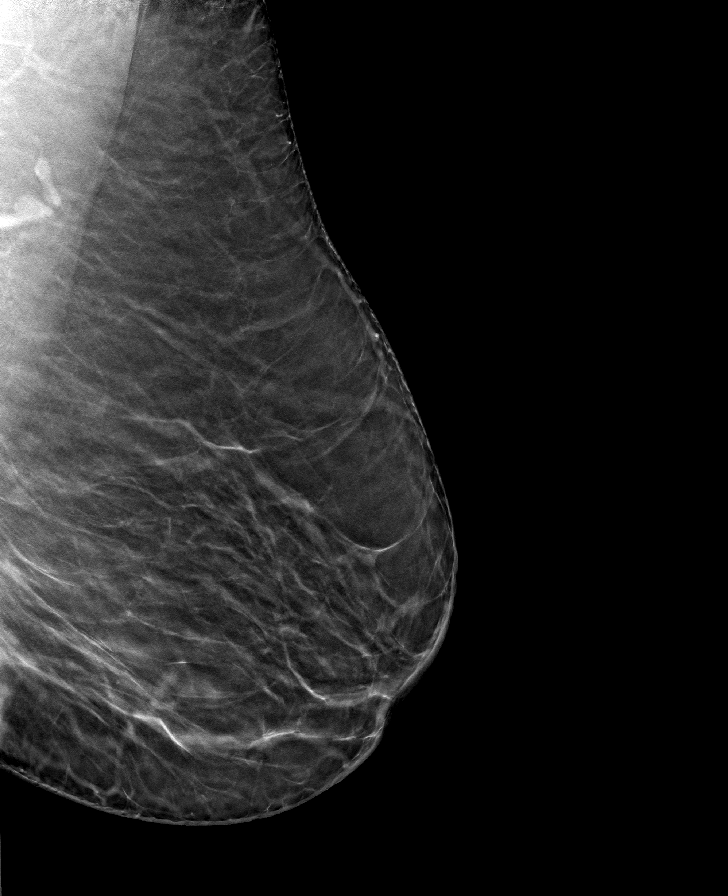

[8 of 24 positions shown; findings below may reference images not displayed]

ACR Breast Density Category b: There are scattered areas of
fibroglandular density.
FINDINGS: There are no findings suspicious for malignancy. Images were
processed with CAD.
IMPRESSION: No mammographic evidence of malignancy. A result letter of this
screening mammogram will be mailed directly to the patient.

RECOMMENDATION:
Screening mammogram in one year. (Code:Y5-G-EJ6)

BI-RADS CATEGORY  1: Negative.

## 2020-02-29 ENCOUNTER — Inpatient Hospital Stay (HOSPITAL_COMMUNITY)
Admission: EM | Admit: 2020-02-29 | Discharge: 2020-03-03 | DRG: 638 | Disposition: A | Discharge status: Home / Self Care | Payer: Medicare HMO | Attending: Internal Medicine | Admitting: Internal Medicine

## 2020-02-29 ENCOUNTER — Other Ambulatory Visit: Payer: Self-pay

## 2020-02-29 ENCOUNTER — Encounter (HOSPITAL_COMMUNITY): Payer: Self-pay | Admitting: Emergency Medicine

## 2020-02-29 DIAGNOSIS — Z90710 Acquired absence of both cervix and uterus: Secondary | ICD-10-CM

## 2020-02-29 DIAGNOSIS — Z8541 Personal history of malignant neoplasm of cervix uteri: Secondary | ICD-10-CM

## 2020-02-29 DIAGNOSIS — I10 Essential (primary) hypertension: Secondary | ICD-10-CM | POA: Diagnosis not present

## 2020-02-29 DIAGNOSIS — Z791 Long term (current) use of non-steroidal anti-inflammatories (NSAID): Secondary | ICD-10-CM

## 2020-02-29 DIAGNOSIS — Z91041 Radiographic dye allergy status: Secondary | ICD-10-CM

## 2020-02-29 DIAGNOSIS — E1100 Type 2 diabetes mellitus with hyperosmolarity without nonketotic hyperglycemic-hyperosmolar coma (NKHHC): Secondary | ICD-10-CM | POA: Diagnosis not present

## 2020-02-29 DIAGNOSIS — Z7984 Long term (current) use of oral hypoglycemic drugs: Secondary | ICD-10-CM

## 2020-02-29 DIAGNOSIS — N182 Chronic kidney disease, stage 2 (mild): Secondary | ICD-10-CM | POA: Diagnosis present

## 2020-02-29 DIAGNOSIS — Z7982 Long term (current) use of aspirin: Secondary | ICD-10-CM

## 2020-02-29 DIAGNOSIS — Z87891 Personal history of nicotine dependence: Secondary | ICD-10-CM

## 2020-02-29 DIAGNOSIS — I129 Hypertensive chronic kidney disease with stage 1 through stage 4 chronic kidney disease, or unspecified chronic kidney disease: Secondary | ICD-10-CM | POA: Diagnosis present

## 2020-02-29 DIAGNOSIS — E785 Hyperlipidemia, unspecified: Secondary | ICD-10-CM | POA: Diagnosis present

## 2020-02-29 DIAGNOSIS — R55 Syncope and collapse: Secondary | ICD-10-CM | POA: Diagnosis present

## 2020-02-29 DIAGNOSIS — Z96652 Presence of left artificial knee joint: Secondary | ICD-10-CM | POA: Diagnosis present

## 2020-02-29 DIAGNOSIS — E1165 Type 2 diabetes mellitus with hyperglycemia: Secondary | ICD-10-CM

## 2020-02-29 DIAGNOSIS — N179 Acute kidney failure, unspecified: Secondary | ICD-10-CM | POA: Diagnosis present

## 2020-02-29 DIAGNOSIS — Z833 Family history of diabetes mellitus: Secondary | ICD-10-CM

## 2020-02-29 DIAGNOSIS — Z8249 Family history of ischemic heart disease and other diseases of the circulatory system: Secondary | ICD-10-CM

## 2020-02-29 DIAGNOSIS — F32A Depression, unspecified: Secondary | ICD-10-CM | POA: Diagnosis present

## 2020-02-29 DIAGNOSIS — Z880 Allergy status to penicillin: Secondary | ICD-10-CM

## 2020-02-29 DIAGNOSIS — D72829 Elevated white blood cell count, unspecified: Secondary | ICD-10-CM | POA: Diagnosis present

## 2020-02-29 DIAGNOSIS — E1122 Type 2 diabetes mellitus with diabetic chronic kidney disease: Secondary | ICD-10-CM | POA: Diagnosis present

## 2020-02-29 DIAGNOSIS — F419 Anxiety disorder, unspecified: Secondary | ICD-10-CM | POA: Diagnosis present

## 2020-02-29 DIAGNOSIS — Z8616 Personal history of COVID-19: Secondary | ICD-10-CM

## 2020-02-29 DIAGNOSIS — F329 Major depressive disorder, single episode, unspecified: Secondary | ICD-10-CM | POA: Diagnosis present

## 2020-02-29 DIAGNOSIS — Z885 Allergy status to narcotic agent status: Secondary | ICD-10-CM

## 2020-02-29 DIAGNOSIS — Z79899 Other long term (current) drug therapy: Secondary | ICD-10-CM

## 2020-02-29 DIAGNOSIS — E876 Hypokalemia: Secondary | ICD-10-CM | POA: Diagnosis present

## 2020-02-29 DIAGNOSIS — IMO0002 Reserved for concepts with insufficient information to code with codable children: Secondary | ICD-10-CM | POA: Diagnosis present

## 2020-02-29 DIAGNOSIS — R739 Hyperglycemia, unspecified: Secondary | ICD-10-CM

## 2020-02-29 DIAGNOSIS — Z881 Allergy status to other antibiotic agents status: Secondary | ICD-10-CM

## 2020-02-29 LAB — COMPREHENSIVE METABOLIC PANEL
ALT: 27 U/L (ref 0–44)
AST: 19 U/L (ref 15–41)
Albumin: 3.6 g/dL (ref 3.5–5.0)
Alkaline Phosphatase: 94 U/L (ref 38–126)
Anion gap: 11 (ref 5–15)
BUN: 39 mg/dL — ABNORMAL HIGH (ref 8–23)
CO2: 24 mmol/L (ref 22–32)
Calcium: 9.8 mg/dL (ref 8.9–10.3)
Chloride: 97 mmol/L — ABNORMAL LOW (ref 98–111)
Creatinine, Ser: 1.2 mg/dL — ABNORMAL HIGH (ref 0.44–1.00)
GFR calc Af Amer: 55 mL/min — ABNORMAL LOW (ref >60)
GFR calc non Af Amer: 47 mL/min — ABNORMAL LOW (ref >60)
Glucose, Bld: 675 mg/dL (ref 70–99)
Potassium: 4.7 mmol/L (ref 3.5–5.1)
Sodium: 132 mmol/L — ABNORMAL LOW (ref 135–145)
Total Bilirubin: 0.6 mg/dL (ref 0.3–1.2)
Total Protein: 6.4 g/dL — ABNORMAL LOW (ref 6.5–8.1)

## 2020-02-29 LAB — CBC WITH DIFFERENTIAL/PLATELET
Abs Immature Granulocytes: 0.08 10*3/uL — ABNORMAL HIGH (ref 0.00–0.07)
Basophils Absolute: 0 10*3/uL (ref 0.0–0.1)
Basophils Relative: 0 %
Eosinophils Absolute: 0 10*3/uL (ref 0.0–0.5)
Eosinophils Relative: 0 %
HCT: 39.5 % (ref 36.0–46.0)
Hemoglobin: 12.8 g/dL (ref 12.0–15.0)
Immature Granulocytes: 1 %
Lymphocytes Relative: 12 %
Lymphs Abs: 1.7 10*3/uL (ref 0.7–4.0)
MCH: 29.2 pg (ref 26.0–34.0)
MCHC: 32.4 g/dL (ref 30.0–36.0)
MCV: 90 fL (ref 80.0–100.0)
Monocytes Absolute: 0.7 10*3/uL (ref 0.1–1.0)
Monocytes Relative: 5 %
Neutro Abs: 11.9 10*3/uL — ABNORMAL HIGH (ref 1.7–7.7)
Neutrophils Relative %: 82 %
Platelets: 245 10*3/uL (ref 150–400)
RBC: 4.39 MIL/uL (ref 3.87–5.11)
RDW: 13.5 % (ref 11.5–15.5)
WBC: 14.5 10*3/uL — ABNORMAL HIGH (ref 4.0–10.5)
nRBC: 0 % (ref 0.0–0.2)

## 2020-02-29 LAB — BLOOD GAS, VENOUS
Acid-base deficit: 2.1 mmol/L — ABNORMAL HIGH (ref 0.0–2.0)
Bicarbonate: 23.2 mmol/L (ref 20.0–28.0)
O2 Saturation: 82.9 %
Patient temperature: 98.6
pCO2, Ven: 44.4 mmHg (ref 44.0–60.0)
pH, Ven: 7.339 (ref 7.250–7.430)
pO2, Ven: 53.4 mmHg — ABNORMAL HIGH (ref 32.0–45.0)

## 2020-02-29 LAB — URINALYSIS, ROUTINE W REFLEX MICROSCOPIC
Bacteria, UA: NONE SEEN
Bilirubin Urine: NEGATIVE
Glucose, UA: >=500 mg/dL — AB
Hgb urine dipstick: NEGATIVE
Ketones, ur: NEGATIVE mg/dL
Leukocytes,Ua: NEGATIVE
Nitrite: NEGATIVE
Protein, ur: NEGATIVE mg/dL
Specific Gravity, Urine: 1.021 (ref 1.005–1.030)
pH: 5 (ref 5.0–8.0)

## 2020-02-29 LAB — CBG MONITORING, ED
Glucose-Capillary: >600 mg/dL (ref 70–99)
Glucose-Capillary: 452 mg/dL — ABNORMAL HIGH (ref 70–99)
Glucose-Capillary: 331 mg/dL — ABNORMAL HIGH (ref 70–99)
Glucose-Capillary: 413 mg/dL — ABNORMAL HIGH (ref 70–99)

## 2020-02-29 MED ORDER — PREGABALIN 75 MG PO CAPS
150.0000 mg | ORAL_CAPSULE | Freq: Every day | ORAL | Status: DC
  Administered 2020-03-01 – 2020-03-02 (×3): 150 mg via ORAL
  Filled 2020-02-29: qty 2
  Filled 2020-02-29: qty 3
  Filled 2020-02-29: qty 2

## 2020-02-29 MED ORDER — INSULIN REGULAR(HUMAN) IN NACL 100-0.9 UT/100ML-% IV SOLN
INTRAVENOUS | Status: DC
  Administered 2020-03-01: 14 [IU]/h via INTRAVENOUS

## 2020-02-29 MED ORDER — DEXTROSE 50 % IV SOLN: 0.0000 mL | INTRAVENOUS | Status: DC | PRN

## 2020-02-29 MED ORDER — DEXTROSE-NACL 5-0.45 % IV SOLN: INTRAVENOUS | Status: DC

## 2020-02-29 MED ORDER — ESCITALOPRAM OXALATE 10 MG PO TABS
10.0000 mg | ORAL_TABLET | Freq: Every day | ORAL | Status: DC
  Administered 2020-03-01 – 2020-03-03 (×3): 10 mg via ORAL
  Filled 2020-02-29 (×3): qty 1

## 2020-02-29 MED ORDER — ATORVASTATIN CALCIUM 40 MG PO TABS
40.0000 mg | ORAL_TABLET | Freq: Every evening | ORAL | Status: DC
  Administered 2020-03-01 – 2020-03-02 (×2): 40 mg via ORAL
  Filled 2020-02-29 (×3): qty 1

## 2020-02-29 MED ORDER — ENOXAPARIN SODIUM 40 MG/0.4ML ~~LOC~~ SOLN
40.0000 mg | Freq: Every day | SUBCUTANEOUS | Status: DC
  Administered 2020-03-01 – 2020-03-03 (×3): 40 mg via SUBCUTANEOUS
  Filled 2020-02-29 (×3): qty 0.4

## 2020-02-29 MED ORDER — PREDNISONE 20 MG PO TABS: 10.0000 mg | ORAL_TABLET | ORAL | Status: DC

## 2020-02-29 MED ORDER — INSULIN REGULAR(HUMAN) IN NACL 100-0.9 UT/100ML-% IV SOLN
INTRAVENOUS | Status: DC
  Administered 2020-02-29: 14 [IU]/h via INTRAVENOUS
  Filled 2020-02-29: qty 100

## 2020-02-29 MED ORDER — SODIUM CHLORIDE 0.9 % IV SOLN: INTRAVENOUS | Status: DC

## 2020-02-29 NOTE — ED Notes (Signed)
Report called for pt transfer to the floor  

## 2020-02-29 NOTE — ED Notes (Signed)
Pt ambulatory to RR 

## 2020-02-29 NOTE — H&P (Signed)
History and Physical    Tiffany Robles [id number] DOB: [birth year] DOA: 02/29/2020  PCP: Vassie Moment, MD  Patient coming from: Home.  Chief Complaint: Elevated blood sugar.  HPI: Tiffany Robles is a 64 y.o. female with history of diabetes mellitus type 2, hypertension, hyperlipidemia who has been placed on prednisone for the last 3 months on a taper dose by patient's orthopedic surgeon after patient developed some swelling after the surgery has been noticing increasing blood sugar over the last 3 weeks.  Patient does take [place] which patient herself increase the dose despite which patient blood sugar has been running more than 300s and over the last couple days more than 400s.  Has mild weakness but denies any chest pain nausea vomiting shortness of breath.  Patient states she also has gained at least 40 pounds after the prednisone was started.  ED Course: In the ER blood sugar was six seventy-five anion gap of eleven creatinine 1.2 WBC 14.5.  EKG shows normal sinus rhythm.  Given the markedly elevated blood sugar patient was started on insulin infusion admitted for uncontrolled diabetes with possible developing hyperosmolar state.  Review of Systems: As per HPI, rest all negative.   Past Medical History:  Diagnosis Date  . Anxiety   . Arthritis   . Cancer ([place])    30 yrs, ago, cervical  . Depression   . Diabetes mellitus without complication ([place])    type 2   . Family history of adverse reaction to anesthesia    mother allergic to Anesthesia drug- had a very bad headache  . Hypertension   . Primary localized osteoarthritis of left knee   . Syncope and collapse    first occurrence 40 years ago , last occurrence 10 years ago , saw cardio for eval , underwent echo and per patient results were normal and was told to f/u with cardio if syncope reoccurs, patient reports no reoccurence of syncope since that time .    Past Surgical History:  Procedure Laterality Date  . ABDOMINAL  HYSTERECTOMY    . DILATION AND CURETTAGE OF UTERUS    . LUMBAR LAMINECTOMY/DECOMPRESSION MICRODISCECTOMY Left 10/31/2019   Procedure: Laminectomy and Foraminotomy - left - Lumbar four-Lumbar five;  Surgeon: Kary Kos, MD;  Location: [facility];  Service: Neurosurgery;  Laterality: Left;  . TOTAL KNEE ARTHROPLASTY Left 09/26/2019   Procedure: TOTAL KNEE ARTHROPLASTY;  Surgeon: Elsie Saas, MD;  Location: [facility];  Service: Orthopedics;  Laterality: Left;     reports that she has quit smoking. Her smoking use included cigarettes. She has a 60.00 pack-year smoking history. She has never used smokeless tobacco. She reports current alcohol use. She reports that she does not use drugs.  Allergies  Allergen Reactions  . Penicillins Swelling and Other (See Comments)    Facial swelling Did it involve swelling of the face/tongue/throat, SOB, or low BP? Yes Did it involve sudden or severe rash/hives, skin peeling, or any reaction on the inside of your mouth or nose? No Did you need to seek medical attention at a hospital or doctor's office?    #  #  YES  #  #  When did it last happen?10+ years If all above answers are "NO", may proceed with cephalosporin use.   . Augmentin [Amoxicillin-Pot Clavulanate] Nausea And Vomiting  . Codeine Nausea And Vomiting  . Ivp Dye [Iodinated Diagnostic Agents] Hives and Itching    Just contrast dye , no reactions to other -dines   . Keflex [  Cephalexin] Nausea And Vomiting  . Oxycodone     Headaches    Family History  Problem Relation Age of Onset  . Diabetes Mother   . Hypertension Mother   . Diabetes Father   . Hypertension Father   . Diabetes Brother   . Hypertension Brother   . Breast cancer Neg Hx     Prior to Admission medications   Medication Sig Start Date End Date Taking? Authorizing Provider  atorvastatin (LIPITOR) 40 MG tablet Take 40 mg by mouth every evening.    Yes [provider]  Calcium Carb-Cholecalciferol (CALCIUM 600 + D  PO) Take 1 tablet by mouth 2 (two) times daily.   Yes [provider]  celecoxib (CELEBREX) 200 MG capsule Take 200 mg by mouth daily. 10/10/19  Yes [provider]  Dulaglutide (TRULICITY) 1.5 0000000 SOPN Inject 3 mg into the skin every Friday.    Yes [provider]  escitalopram (LEXAPRO) 10 MG tablet Take 10 mg by mouth daily. 02/14/20  Yes [provider]  glipiZIDE (GLUCOTROL) 5 MG tablet Take 5 mg by mouth 2 (two) times daily.   Yes [provider]  ibuprofen (ADVIL) 800 MG tablet Take 800 mg by mouth every 8 (eight) hours as needed for moderate pain.   Yes [provider]  lisinopril-hydrochlorothiazide (ZESTORETIC) 20-25 MG tablet Take 1 tablet by mouth daily.   Yes [provider]  metFORMIN (GLUCOPHAGE-XR) 500 MG 24 hr tablet Take 1,000 mg by mouth 2 (two) times daily.   Yes [provider]  Multiple Vitamin (MULTIVITAMIN WITH MINERALS) TABS tablet Take 1 tablet by mouth daily.    Yes [provider]  predniSONE (DELTASONE) 10 MG tablet Take 10 mg by mouth See admin instructions. Day 1(02/25/20) : Take 6 tablets by mouth daily for 4 days, then take 4 tablets by mouth daily for 4 days, then take 2 tablets by mouth for 3 days. Complete course by (03/06/20) per patient. 02/25/20  Yes [provider]  pregabalin (LYRICA) 150 MG capsule Take 150 mg by mouth at bedtime.  02/14/20  Yes [provider]  TRESIBA FLEXTOUCH 100 UNIT/ML FlexTouch Pen Inject 10-20 Units into the skin at bedtime. Take 10 units SQ when sugar is 166, the take 20 units when sugar is above 200. 02/05/20  Yes [provider]  aspirin EC 325 MG EC tablet 1 tab a day for the next 30 days to prevent blood clots Patient not taking: Reported on 10/25/2019 09/27/19   Shepperson, Kirstin, PA-C  docusate sodium (COLACE) 100 MG capsule 1 tab 2 times a day while on narcotics.  STOOL SOFTENER Patient not taking: Reported on 10/25/2019  09/27/19   Shepperson, Kirstin, PA-C  gabapentin (NEURONTIN) 300 MG capsule 1 po qhs for nerve pain Patient not taking: Reported on 02/29/2020 09/27/19   Shepperson, Kirstin, PA-C  oxyCODONE (OXY IR/ROXICODONE) 5 MG immediate release tablet 1 po q 4 hrs prn pain.  Left total knee replacement 09/26/2019 Patient not taking: Reported on 10/25/2019 09/27/19   Shepperson, Kirstin, PA-C  polyethylene glycol (MIRALAX / GLYCOLAX) 17 g packet 17grams in 6 oz of something to drink twice a day until bowel movement.  LAXITIVE.  Restart if two days since last bowel movement Patient not taking: Reported on 10/25/2019 09/27/19   Shepperson, Kirstin, PA-C  diphenhydrAMINE (BENADRYL) 25 MG tablet Take 25 mg by mouth daily as needed for allergies.  08/16/19  [provider]    Physical Exam: Constitutional: Moderately  built and nourished. Vitals:   02/29/20 1941 02/29/20 2224 02/29/20 2230  BP: (!) 188/88 (!) 144/84 (!) 168/73  Pulse: (!) 112 82 84  Resp: 16 13 13   Temp: 97.8 F (36.6 C)    TempSrc: Oral    SpO2: 98% 96% 96%   Eyes: Anicteric no pallor. ENMT: No discharge from the ears eyes nose or mouth. Neck: No mass felt.  No neck rigidity. Respiratory: No rhonchi or crepitations. Cardiovascular: S1-S2 heard. Abdomen: Soft nontender bowel sounds present. Musculoskeletal: No edema. Skin: No rash. Neurologic: Alert awake oriented time place and person.  Moves all extremities. Psychiatric: Appears normal but normal affect.   Labs on Admission: I have personally reviewed following labs and imaging studies  CBC: Recent Labs  Lab 02/29/20 2011  WBC 14.5*  NEUTROABS 11.9*  HGB 12.8  HCT 39.5  MCV 90.0  PLT 99991111   Basic Metabolic Panel: Recent Labs  Lab 02/29/20 2011  NA 132*  K 4.7  CL 97*  CO2 24  GLUCOSE 675*  BUN 39*  CREATININE 1.20*  CALCIUM 9.8   GFR: CrCl cannot be calculated (Unknown ideal weight.). Liver Function Tests: Recent Labs  Lab 02/29/20 2011  AST 19   ALT 27  ALKPHOS 94  BILITOT 0.6  PROT 6.4*  ALBUMIN 3.6   No results for input(s): LIPASE, AMYLASE in the last 168 hours. No results for input(s): AMMONIA in the last 168 hours. Coagulation Profile: No results for input(s): INR, PROTIME in the last 168 hours. Cardiac Enzymes: No results for input(s): CKTOTAL, CKMB, CKMBINDEX, TROPONINI in the last 168 hours. BNP (last 3 results) No results for input(s): PROBNP in the last 8760 hours. HbA1C: No results for input(s): HGBA1C in the last 72 hours. CBG: Recent Labs  Lab 02/29/20 1942 02/29/20 2152 02/29/20 2230 02/29/20 2324  GLUCAP >600* 452* 413* 331*   Lipid Profile: No results for input(s): CHOL, HDL, LDLCALC, TRIG, CHOLHDL, LDLDIRECT in the last 72 hours. Thyroid Function Tests: No results for input(s): TSH, T4TOTAL, FREET4, T3FREE, THYROIDAB in the last 72 hours. Anemia Panel: No results for input(s): VITAMINB12, FOLATE, FERRITIN, TIBC, IRON, RETICCTPCT in the last 72 hours. Urine analysis:    Component Value Date/Time   COLORURINE STRAW (A) 02/29/2020 1957   APPEARANCEUR CLEAR 02/29/2020 1957   LABSPEC 1.021 02/29/2020 1957   PHURINE 5.0 02/29/2020 1957   GLUCOSEU >=500 (A) 02/29/2020 1957   HGBUR NEGATIVE 02/29/2020 1957   BILIRUBINUR NEGATIVE 02/29/2020 [place] NEGATIVE 02/29/2020 1957   PROTEINUR NEGATIVE 02/29/2020 1957   NITRITE NEGATIVE 02/29/2020 1957   LEUKOCYTESUR NEGATIVE 02/29/2020 1957   Sepsis Labs: @LABRCNTIP (procalcitonin:4,lacticidven:4) )No results found for this or any previous visit (from the past 240 hour(s)).   Radiological Exams on Admission: No results found.  EKG: Independently reviewed.  Normal sinus rhythm.  Assessment/Plan Principal Problem:   Hyperosmolar non-ketotic state due to type 2 diabetes mellitus ([place]) Active Problems:   Essential hypertension   HLD (hyperlipidemia)    1. Uncontrolled diabetes mellitus type 2 with marked hyperglycemia with possible early  hyperosmolar status for which patient has been started on IV insulin infusion check hemoglobin A1c.  Once blood sugar is less than two fifty will change to long-acting Lantus insulin. 2. Hypertension -we will continue lisinopril we will hold hydrochlorothiazide since patient is receiving fluids.  As needed IV hydralazine. 3. Hyperlipidemia on statins. 4. Patient is on prednisone for left knee swelling per orthopedics. 5. Covid test positive and was positive on December  2020.  Presently asymptomatic. 6. Leukocytosis likely from prednisone.  No definite signs of any infection at this time. 7. Possible chronic kidney disease stage II.  Creatinine appears to be at baseline.  Note that patient is on lisinopril.   DVT prophylaxis: Lovenox. Code Status: Full code. Family Communication: Discussed with patient. Disposition Plan: Home. Consults called: None. Admission status: Observation.   Rise Patience MD [facility] Pager [phone].  If 7PM-7AM, please contact night-coverage [email or web address] Password [facility]  02/29/2020, 11:34 PM

## 2020-02-29 NOTE — ED Notes (Signed)
CRITICAL VALUE STICKER  CRITICAL VALUE: 675 glucose   RECEIVER (on-site recipient of call): this RN  DATE & TIME NOTIFIED: today now   MESSENGER (representative from lab): Verlin Dike  MD NOTIFIED: Alvino Chapel MD  TIME OF NOTIFICATION: today now   RESPONSE: Noted

## 2020-02-29 NOTE — ED Triage Notes (Addendum)
Patient arrives POV with complaints of hyperglycemia. Patient states that over the last week, her CBGs at home have not been under 350. Patient was started on steroid for edema in her leg a few weeks ago and has been compliant with her medications. Patient states tonight she noted her monitor was reading HI and her doctor is out of town. CBG here also reads HI.

## 2020-02-29 NOTE — ED Provider Notes (Signed)
Tiffany Robles Provider Note   CSN: [id number] Arrival date & time: 02/29/20  1935     History Chief Complaint  Patient presents with  . Hyperglycemia    Tiffany Robles is a 64 y.o. female.  HPI Patient presents with hyperglycemia.  History of diabetes but has been on steroids for around the last month.  States that been tapering down when she is now on 40 mg.  States she was put on it because she was having pain in her left leg after knee replacement.  States she was put on it by Dr. Para March.  States she is had urinary frequency.  States she has felt dizzy.  States she is having difficulty standing up at times.  States sugars been running high.  States 3 weeks ago it was 350.  States been higher than to working his way up to now over 600.  States she is up over 40 pounds since starting the steroid.  No fevers.  No cough.  States she has fatigue and dizziness but no shortness of breath.  States she is able to lay flat in bed.  States she was worked up for a clot in her left leg and told there was none there.    Past Medical History:  Diagnosis Date  . Anxiety   . Arthritis   . Cancer ([place])    30 yrs, ago, cervical  . Depression   . Diabetes mellitus without complication ([place])    type 2   . Family history of adverse reaction to anesthesia    mother allergic to Anesthesia drug- had a very bad headache  . Hypertension   . Primary localized osteoarthritis of left knee   . Syncope and collapse    first occurrence 40 years ago , last occurrence 10 years ago , saw cardio for eval , underwent echo and per patient results were normal and was told to f/u with cardio if syncope reoccurs, patient reports no reoccurence of syncope since that time .    Patient Active Problem List   Diagnosis Date Noted  . Spinal stenosis at L4-L5 level 10/31/2019  . Primary localized osteoarthritis of left knee   . Hypertension   . Diabetes mellitus without complication  ([place])   . Anxiety     Past Surgical History:  Procedure Laterality Date  . ABDOMINAL HYSTERECTOMY    . DILATION AND CURETTAGE OF UTERUS    . LUMBAR LAMINECTOMY/DECOMPRESSION MICRODISCECTOMY Left 10/31/2019   Procedure: Laminectomy and Foraminotomy - left - Lumbar four-Lumbar five;  Surgeon: Kary Kos, MD;  Location: [facility];  Service: Neurosurgery;  Laterality: Left;  . TOTAL KNEE ARTHROPLASTY Left 09/26/2019   Procedure: TOTAL KNEE ARTHROPLASTY;  Surgeon: Elsie Saas, MD;  Location: [facility];  Service: Orthopedics;  Laterality: Left;     OB History    Gravida  6   Para      Term      Preterm      AB  2   Living  4     SAB  2   TAB      Ectopic      Multiple      Live Births  4           Family History  Problem Relation Age of Onset  . Diabetes Mother   . Hypertension Mother   . Diabetes Father   . Hypertension Father   . Diabetes Brother   . Hypertension Brother   .  Breast cancer Neg Hx     Social History   Tobacco Use  . Smoking status: Former Smoker    Packs/day: 3.00    Years: 20.00    Pack years: 60.00    Types: Cigarettes  . Smokeless tobacco: Never Used  Substance Use Topics  . Alcohol use: Yes    Comment: occassionally  . Drug use: Never    Home Medications Prior to Admission medications   Medication Sig Start Date End Date Taking? Authorizing Provider  acetaminophen (TYLENOL) 500 MG tablet Take 1,000 mg by mouth every 6 (six) hours as needed for moderate pain or headache.    [provider]  aspirin EC 325 MG EC tablet 1 tab a day for the next 30 days to prevent blood clots Patient not taking: Reported on 10/25/2019 09/27/19   Shepperson, Kirstin, PA-C  atorvastatin (LIPITOR) 40 MG tablet Take 40 mg by mouth every evening.     [provider]  Calcium Carb-Cholecalciferol (CALCIUM 600 + D PO) Take 1 tablet by mouth 2 (two) times daily.    [provider]  celecoxib (CELEBREX) 200 MG capsule Take 200 mg by  mouth daily. 10/10/19   [provider]  docusate sodium (COLACE) 100 MG capsule 1 tab 2 times a day while on narcotics.  STOOL SOFTENER Patient not taking: Reported on 10/25/2019 09/27/19   Shepperson, Kirstin, PA-C  Dulaglutide (TRULICITY) 1.5 0000000 SOPN Inject 3 mg into the skin every Friday.     [provider]  ELIQUIS 2.5 MG TABS tablet Take 2.5 mg by mouth 2 (two) times daily. 10/04/19   [provider]  gabapentin (NEURONTIN) 300 MG capsule 1 po qhs for nerve pain Patient not taking: Reported on 10/25/2019 09/27/19   Shepperson, Kirstin, PA-C  glipiZIDE (GLUCOTROL) 5 MG tablet Take 5 mg by mouth 2 (two) times daily.    [provider]  lisinopril-hydrochlorothiazide (ZESTORETIC) 20-25 MG tablet Take 1 tablet by mouth daily.    [provider]  metFORMIN (GLUCOPHAGE-XR) 500 MG 24 hr tablet Take 1,000 mg by mouth 2 (two) times daily.    [provider]  Multiple Vitamin (MULTIVITAMIN WITH MINERALS) TABS tablet Take 1 tablet by mouth daily.     [provider]  mupirocin ointment (BACTROBAN) 2 % Place 1 application into the nose 2 (two) times daily. 09/16/19   [provider]  oxyCODONE (OXY IR/ROXICODONE) 5 MG immediate release tablet 1 po q 4 hrs prn pain.  Left total knee replacement 09/26/2019 Patient not taking: Reported on 10/25/2019 09/27/19   Shepperson, Kirstin, PA-C  PARoxetine (PAXIL) 30 MG tablet Take 60 mg by mouth daily.    [provider]  polyethylene glycol (MIRALAX / GLYCOLAX) 17 g packet 17grams in 6 oz of something to drink twice a day until bowel movement.  LAXITIVE.  Restart if two days since last bowel movement Patient not taking: Reported on 10/25/2019 09/27/19   Shepperson, Kirstin, PA-C  predniSONE (DELTASONE) 10 MG tablet Take 10 mg by mouth daily. 10/15/19   [provider]  zolpidem (AMBIEN) 5 MG tablet Take 5 mg by mouth at bedtime.    [provider]   diphenhydrAMINE (BENADRYL) 25 MG tablet Take 25 mg by mouth daily as needed for allergies.  08/16/19  [provider]    Allergies    Penicillins, Augmentin [amoxicillin-pot clavulanate], Codeine, Ivp dye [iodinated diagnostic agents], Keflex [cephalexin], and Oxycodone  Review of Systems   Review of Systems  Constitutional:  Positive for fatigue. Negative for appetite change.  HENT: Negative for congestion.   Respiratory: Negative for shortness of breath.   Cardiovascular: Positive for leg swelling. Negative for chest pain.  Gastrointestinal: Negative for abdominal pain.  Endocrine: Positive for polydipsia and polyuria.  Musculoskeletal: Negative for back pain.  Neurological: Positive for dizziness.  Psychiatric/Behavioral: Negative for confusion.    Physical Exam Updated Vital Signs BP (!) 188/88 (BP Location: Right Arm)   Pulse (!) 112   Temp 97.8 F (36.6 C) (Oral)   Resp 16   SpO2 98%   Physical Exam Vitals reviewed.  HENT:     Head: Normocephalic.  Eyes:     Pupils: Pupils are equal, round, and reactive to light.  Cardiovascular:     Rate and Rhythm: Tachycardia present.  Pulmonary:     Breath sounds: No wheezing or rhonchi.  Abdominal:     Tenderness: There is no abdominal tenderness.  Musculoskeletal:     Cervical back: Neck supple.     Right lower leg: Edema present.     Left lower leg: Edema present.  Skin:    General: Skin is warm.     Capillary Refill: Capillary refill takes less than 2 seconds.  Neurological:     Mental Status: She is alert and oriented to person, place, and time.     ED Results / Procedures / Treatments   Labs (all labs ordered are listed, but only abnormal results are displayed) Labs Reviewed  BLOOD GAS, VENOUS - Abnormal; Notable for the following components:      Result Value   pO2, Ven 53.4 (*)    Acid-base deficit 2.1 (*)    All other components within normal limits  COMPREHENSIVE METABOLIC PANEL - Abnormal;  Notable for the following components:   Sodium 132 (*)    Chloride 97 (*)    Glucose, Bld 675 (*)    BUN 39 (*)    Creatinine, Ser 1.20 (*)    Total Protein 6.4 (*)    GFR calc non Af Amer 47 (*)    GFR calc Af Amer 55 (*)    All other components within normal limits  CBC WITH DIFFERENTIAL/PLATELET - Abnormal; Notable for the following components:   WBC 14.5 (*)    Neutro Abs 11.9 (*)    Abs Immature Granulocytes 0.08 (*)    All other components within normal limits  CBG MONITORING, ED - Abnormal; Notable for the following components:   Glucose-Capillary >600 (*)    All other components within normal limits  SARS CORONAVIRUS 2 (TAT 6-24 HRS)  URINALYSIS, ROUTINE W REFLEX MICROSCOPIC    EKG EKG Interpretation  Date/Time:  Wednesday February 29 2020 20:05:59 EDT Ventricular Rate:  101 PR Interval:    QRS Duration: 88 QT Interval:  325 QTC Calculation: 422 R Axis:   31 Text Interpretation: Sinus tachycardia Low voltage, precordial leads Confirmed by Davonna Belling [phone]) on 02/29/2020 8:41:10 PM   Radiology No results found.  Procedures Procedures (including critical care time)  Medications Ordered in ED Medications  insulin regular, human (MYXREDLIN) 100 units/ 100 mL infusion (has no administration in time range)  0.9 %  sodium chloride infusion (has no administration in time range)  dextrose 5 %-0.45 % sodium chloride infusion (has no administration in time range)  dextrose 50 % solution 0-50 mL (has no administration in time range)    ED Course  I have reviewed the triage vital signs and the nursing notes.  Pertinent  labs & imaging results that were available during my care of the patient were reviewed by me and considered in my medical decision making (see chart for details).    MDM Rules/Calculators/A&P                      Patient with hyperglycemia.  Sugar of 675.  Does not appear to DKA.  Heart rate is normalized.  However has put on around 40 pounds  since being on steroids.  I think with her sugar this high and being likely volume overload at this time would benefit from admission to the hospital for further management of both the fluid/volume status and hypoglycemia.  Will discuss with hospitalist.  CRITICAL CARE Performed by: Davonna Belling Total critical care time: 30 minutes Critical care time was exclusive of separately billable procedures and treating other patients. Critical care was necessary to treat or prevent imminent or life-threatening deterioration. Critical care was time spent personally by me on the following activities: development of treatment plan with patient and/or surrogate as well as nursing, discussions with consultants, evaluation of patient's response to treatment, examination of patient, obtaining history from patient or surrogate, ordering and performing treatments and interventions, ordering and review of laboratory studies, ordering and review of radiographic studies, pulse oximetry and re-evaluation of patient's condition.  Final Clinical Impression(s) / ED Diagnoses Final diagnoses:  Hyperglycemia    Rx / DC Orders ED Discharge Orders    None       Davonna Belling, MD 02/29/20 2133

## 2020-03-01 DIAGNOSIS — E876 Hypokalemia: Secondary | ICD-10-CM | POA: Diagnosis present

## 2020-03-01 DIAGNOSIS — F419 Anxiety disorder, unspecified: Secondary | ICD-10-CM

## 2020-03-01 DIAGNOSIS — E1122 Type 2 diabetes mellitus with diabetic chronic kidney disease: Secondary | ICD-10-CM | POA: Diagnosis present

## 2020-03-01 DIAGNOSIS — N179 Acute kidney failure, unspecified: Secondary | ICD-10-CM | POA: Diagnosis present

## 2020-03-01 DIAGNOSIS — I129 Hypertensive chronic kidney disease with stage 1 through stage 4 chronic kidney disease, or unspecified chronic kidney disease: Secondary | ICD-10-CM | POA: Diagnosis present

## 2020-03-01 DIAGNOSIS — Z87891 Personal history of nicotine dependence: Secondary | ICD-10-CM | POA: Diagnosis not present

## 2020-03-01 DIAGNOSIS — I10 Essential (primary) hypertension: Secondary | ICD-10-CM

## 2020-03-01 DIAGNOSIS — Z833 Family history of diabetes mellitus: Secondary | ICD-10-CM | POA: Diagnosis not present

## 2020-03-01 DIAGNOSIS — E1100 Type 2 diabetes mellitus with hyperosmolarity without nonketotic hyperglycemic-hyperosmolar coma (NKHHC): Secondary | ICD-10-CM | POA: Diagnosis present

## 2020-03-01 DIAGNOSIS — E785 Hyperlipidemia, unspecified: Secondary | ICD-10-CM | POA: Diagnosis present

## 2020-03-01 DIAGNOSIS — E118 Type 2 diabetes mellitus with unspecified complications: Secondary | ICD-10-CM

## 2020-03-01 DIAGNOSIS — Z91041 Radiographic dye allergy status: Secondary | ICD-10-CM | POA: Diagnosis not present

## 2020-03-01 DIAGNOSIS — Z885 Allergy status to narcotic agent status: Secondary | ICD-10-CM | POA: Diagnosis not present

## 2020-03-01 DIAGNOSIS — R739 Hyperglycemia, unspecified: Secondary | ICD-10-CM | POA: Diagnosis not present

## 2020-03-01 DIAGNOSIS — Z880 Allergy status to penicillin: Secondary | ICD-10-CM | POA: Diagnosis not present

## 2020-03-01 DIAGNOSIS — F329 Major depressive disorder, single episode, unspecified: Secondary | ICD-10-CM | POA: Diagnosis present

## 2020-03-01 DIAGNOSIS — Z8616 Personal history of COVID-19: Secondary | ICD-10-CM | POA: Diagnosis not present

## 2020-03-01 DIAGNOSIS — E7800 Pure hypercholesterolemia, unspecified: Secondary | ICD-10-CM

## 2020-03-01 DIAGNOSIS — F320 Major depressive disorder, single episode, mild: Secondary | ICD-10-CM | POA: Diagnosis not present

## 2020-03-01 DIAGNOSIS — R55 Syncope and collapse: Secondary | ICD-10-CM | POA: Diagnosis present

## 2020-03-01 DIAGNOSIS — F32A Depression, unspecified: Secondary | ICD-10-CM | POA: Diagnosis present

## 2020-03-01 DIAGNOSIS — Z791 Long term (current) use of non-steroidal anti-inflammatories (NSAID): Secondary | ICD-10-CM | POA: Diagnosis not present

## 2020-03-01 DIAGNOSIS — Z7984 Long term (current) use of oral hypoglycemic drugs: Secondary | ICD-10-CM | POA: Diagnosis not present

## 2020-03-01 DIAGNOSIS — Z96652 Presence of left artificial knee joint: Secondary | ICD-10-CM | POA: Diagnosis present

## 2020-03-01 DIAGNOSIS — N182 Chronic kidney disease, stage 2 (mild): Secondary | ICD-10-CM | POA: Diagnosis present

## 2020-03-01 DIAGNOSIS — Z881 Allergy status to other antibiotic agents status: Secondary | ICD-10-CM | POA: Diagnosis not present

## 2020-03-01 DIAGNOSIS — IMO0002 Reserved for concepts with insufficient information to code with codable children: Secondary | ICD-10-CM | POA: Diagnosis present

## 2020-03-01 DIAGNOSIS — Z8249 Family history of ischemic heart disease and other diseases of the circulatory system: Secondary | ICD-10-CM | POA: Diagnosis not present

## 2020-03-01 DIAGNOSIS — E1165 Type 2 diabetes mellitus with hyperglycemia: Secondary | ICD-10-CM

## 2020-03-01 DIAGNOSIS — Z8541 Personal history of malignant neoplasm of cervix uteri: Secondary | ICD-10-CM | POA: Diagnosis not present

## 2020-03-01 DIAGNOSIS — Z7982 Long term (current) use of aspirin: Secondary | ICD-10-CM | POA: Diagnosis not present

## 2020-03-01 LAB — BASIC METABOLIC PANEL
Anion gap: 9 (ref 5–15)
Anion gap: 7 (ref 5–15)
BUN: 36 mg/dL — ABNORMAL HIGH (ref 8–23)
BUN: 29 mg/dL — ABNORMAL HIGH (ref 8–23)
CO2: 27 mmol/L (ref 22–32)
CO2: 24 mmol/L (ref 22–32)
Calcium: 10.5 mg/dL — ABNORMAL HIGH (ref 8.9–10.3)
Calcium: 9.2 mg/dL (ref 8.9–10.3)
Chloride: 104 mmol/L (ref 98–111)
Chloride: 110 mmol/L (ref 98–111)
Creatinine, Ser: 1.07 mg/dL — ABNORMAL HIGH (ref 0.44–1.00)
Creatinine, Ser: 1 mg/dL (ref 0.44–1.00)
GFR calc Af Amer: >60 mL/min (ref >60)
GFR calc Af Amer: >60 mL/min (ref >60)
GFR calc non Af Amer: 54 mL/min — ABNORMAL LOW (ref >60)
GFR calc non Af Amer: 59 mL/min — ABNORMAL LOW (ref >60)
Glucose, Bld: 162 mg/dL — ABNORMAL HIGH (ref 70–99)
Glucose, Bld: 89 mg/dL (ref 70–99)
Potassium: 3.4 mmol/L — ABNORMAL LOW (ref 3.5–5.1)
Potassium: 3.3 mmol/L — ABNORMAL LOW (ref 3.5–5.1)
Sodium: 140 mmol/L (ref 135–145)
Sodium: 141 mmol/L (ref 135–145)

## 2020-03-01 LAB — CBG MONITORING, ED
Glucose-Capillary: 112 mg/dL — ABNORMAL HIGH (ref 70–99)
Glucose-Capillary: 165 mg/dL — ABNORMAL HIGH (ref 70–99)
Glucose-Capillary: 313 mg/dL — ABNORMAL HIGH (ref 70–99)
Glucose-Capillary: 81 mg/dL (ref 70–99)
Glucose-Capillary: 91 mg/dL (ref 70–99)

## 2020-03-01 LAB — MAGNESIUM: Magnesium: 1.4 mg/dL — ABNORMAL LOW (ref 1.7–2.4)

## 2020-03-01 LAB — CBC WITH DIFFERENTIAL/PLATELET
Abs Immature Granulocytes: 0.06 10*3/uL (ref 0.00–0.07)
Basophils Absolute: 0.1 10*3/uL (ref 0.0–0.1)
Basophils Relative: 1 %
Eosinophils Absolute: 0.3 10*3/uL (ref 0.0–0.5)
Eosinophils Relative: 2 %
HCT: 40.6 % (ref 36.0–46.0)
Hemoglobin: 13 g/dL (ref 12.0–15.0)
Immature Granulocytes: 0 %
Lymphocytes Relative: 44 %
Lymphs Abs: 6.2 10*3/uL — ABNORMAL HIGH (ref 0.7–4.0)
MCH: 28.8 pg (ref 26.0–34.0)
MCHC: 32 g/dL (ref 30.0–36.0)
MCV: 90 fL (ref 80.0–100.0)
Monocytes Absolute: 0.7 10*3/uL (ref 0.1–1.0)
Monocytes Relative: 5 %
Neutro Abs: 6.7 10*3/uL (ref 1.7–7.7)
Neutrophils Relative %: 48 %
Platelets: 247 10*3/uL (ref 150–400)
RBC: 4.51 MIL/uL (ref 3.87–5.11)
RDW: 13.5 % (ref 11.5–15.5)
WBC: 14 10*3/uL — ABNORMAL HIGH (ref 4.0–10.5)
nRBC: 0 % (ref 0.0–0.2)

## 2020-03-01 LAB — GLUCOSE, CAPILLARY
Glucose-Capillary: 489 mg/dL — ABNORMAL HIGH (ref 70–99)
Glucose-Capillary: 394 mg/dL — ABNORMAL HIGH (ref 70–99)

## 2020-03-01 LAB — PHOSPHORUS: Phosphorus: 4.5 mg/dL (ref 2.5–4.6)

## 2020-03-01 LAB — CBC
HCT: 38.9 % (ref 36.0–46.0)
Hemoglobin: 12.7 g/dL (ref 12.0–15.0)
MCH: 28.8 pg (ref 26.0–34.0)
MCHC: 32.6 g/dL (ref 30.0–36.0)
MCV: 88.2 fL (ref 80.0–100.0)
Platelets: 234 10*3/uL (ref 150–400)
RBC: 4.41 MIL/uL (ref 3.87–5.11)
RDW: 13.4 % (ref 11.5–15.5)
WBC: 14.1 10*3/uL — ABNORMAL HIGH (ref 4.0–10.5)
nRBC: 0 % (ref 0.0–0.2)

## 2020-03-01 LAB — HIV ANTIBODY (ROUTINE TESTING W REFLEX): HIV Screen 4th Generation wRfx: NONREACTIVE

## 2020-03-01 LAB — HEMOGLOBIN A1C
Hgb A1c MFr Bld: 9.7 % — ABNORMAL HIGH (ref 4.8–5.6)
Mean Plasma Glucose: 231.69 mg/dL

## 2020-03-01 LAB — SARS CORONAVIRUS 2 (TAT 6-24 HRS): SARS Coronavirus 2: POSITIVE — AB

## 2020-03-01 LAB — OSMOLALITY: Osmolality: 300 mOsm/kg — ABNORMAL HIGH (ref 275–295)

## 2020-03-01 MED ORDER — HYDRALAZINE HCL 20 MG/ML IJ SOLN: 10.0000 mg | INTRAMUSCULAR | Status: DC | PRN

## 2020-03-01 MED ORDER — MAGNESIUM SULFATE 50 % IJ SOLN
3.0000 g | Freq: Once | INTRAVENOUS | Status: AC
  Administered 2020-03-01: 3 g via INTRAVENOUS
  Filled 2020-03-01: qty 6

## 2020-03-01 MED ORDER — INSULIN ASPART 100 UNIT/ML ~~LOC~~ SOLN
10.0000 [IU] | Freq: Three times a day (TID) | SUBCUTANEOUS | Status: DC
  Administered 2020-03-02 (×2): 10 [IU] via SUBCUTANEOUS

## 2020-03-01 MED ORDER — INSULIN GLARGINE 100 UNIT/ML ~~LOC~~ SOLN
30.0000 [IU] | Freq: Every day | SUBCUTANEOUS | Status: DC
  Administered 2020-03-01: 30 [IU] via SUBCUTANEOUS
  Filled 2020-03-01: qty 0.3

## 2020-03-01 MED ORDER — INSULIN ASPART 100 UNIT/ML ~~LOC~~ SOLN
0.0000 [IU] | SUBCUTANEOUS | Status: DC
  Administered 2020-03-01 (×2): 20 [IU] via SUBCUTANEOUS
  Administered 2020-03-02: 05:00:00 7 [IU] via SUBCUTANEOUS
  Administered 2020-03-02: 01:00:00 4 [IU] via SUBCUTANEOUS
  Administered 2020-03-02: 12:00:00 15 [IU] via SUBCUTANEOUS

## 2020-03-01 MED ORDER — INSULIN ASPART 100 UNIT/ML ~~LOC~~ SOLN
0.0000 [IU] | Freq: Three times a day (TID) | SUBCUTANEOUS | Status: DC
  Filled 2020-03-01: qty 0.09

## 2020-03-01 MED ORDER — INSULIN ASPART 100 UNIT/ML ~~LOC~~ SOLN
0.0000 [IU] | Freq: Every day | SUBCUTANEOUS | Status: DC
  Filled 2020-03-01: qty 0.05

## 2020-03-01 MED ORDER — POTASSIUM CHLORIDE CRYS ER 20 MEQ PO TBCR
20.0000 meq | EXTENDED_RELEASE_TABLET | Freq: Once | ORAL | Status: AC
  Administered 2020-03-01: 04:00:00 20 meq via ORAL
  Filled 2020-03-01: qty 1

## 2020-03-01 MED ORDER — PREDNISONE 20 MG PO TABS: 20.0000 mg | ORAL_TABLET | Freq: Every day | ORAL | Status: DC

## 2020-03-01 MED ORDER — LIVING WELL WITH DIABETES BOOK
Freq: Once | Status: AC
  Filled 2020-03-01: qty 1

## 2020-03-01 MED ORDER — INSULIN ASPART 100 UNIT/ML ~~LOC~~ SOLN
4.0000 [IU] | Freq: Three times a day (TID) | SUBCUTANEOUS | Status: DC
  Filled 2020-03-01: qty 0.04

## 2020-03-01 MED ORDER — HYDROCHLOROTHIAZIDE 25 MG PO TABS
25.0000 mg | ORAL_TABLET | Freq: Every day | ORAL | Status: DC
  Administered 2020-03-01 – 2020-03-03 (×3): 25 mg via ORAL
  Filled 2020-03-01 (×3): qty 1

## 2020-03-01 MED ORDER — INSULIN GLARGINE 100 UNIT/ML ~~LOC~~ SOLN
25.0000 [IU] | Freq: Every day | SUBCUTANEOUS | Status: DC
  Administered 2020-03-02: 25 [IU] via SUBCUTANEOUS
  Filled 2020-03-01 (×2): qty 0.25

## 2020-03-01 MED ORDER — IBUPROFEN 200 MG PO TABS
600.0000 mg | ORAL_TABLET | Freq: Three times a day (TID) | ORAL | Status: DC | PRN
  Administered 2020-03-01 – 2020-03-03 (×5): 600 mg via ORAL
  Filled 2020-03-01 (×6): qty 3

## 2020-03-01 MED ORDER — LISINOPRIL 20 MG PO TABS
20.0000 mg | ORAL_TABLET | Freq: Every day | ORAL | Status: DC
  Administered 2020-03-01 – 2020-03-03 (×3): 20 mg via ORAL
  Filled 2020-03-01 (×3): qty 1

## 2020-03-01 MED ORDER — POTASSIUM CHLORIDE CRYS ER 20 MEQ PO TBCR
20.0000 meq | EXTENDED_RELEASE_TABLET | Freq: Once | ORAL | Status: AC
  Administered 2020-03-01: 20 meq via ORAL
  Filled 2020-03-01: qty 1

## 2020-03-01 MED ORDER — PREDNISONE 20 MG PO TABS
40.0000 mg | ORAL_TABLET | Freq: Every day | ORAL | Status: DC
  Administered 2020-03-01 – 2020-03-02 (×2): 40 mg via ORAL
  Filled 2020-03-01 (×3): qty 2

## 2020-03-01 MED ORDER — INSULIN ASPART 100 UNIT/ML ~~LOC~~ SOLN
0.0000 [IU] | Freq: Three times a day (TID) | SUBCUTANEOUS | Status: DC
  Administered 2020-03-01: 13:00:00 11 [IU] via SUBCUTANEOUS
  Filled 2020-03-01: qty 0.15

## 2020-03-01 NOTE — ED Notes (Addendum)
Pt. CBG 91, RN,Milyssa made aware.

## 2020-03-01 NOTE — Progress Notes (Signed)
Inpatient Diabetes Program Recommendations  AACE/ADA: New Consensus Statement on Inpatient Glycemic Control (2015)  Target Ranges:  Prepandial:   less than 140 mg/dL      Peak postprandial:   less than 180 mg/dL (1-2 hours)      Critically ill patients:  140 - 180 mg/dL   Lab Results  Component Value Date   GLUCAP 313 (H) 03/01/2020   HGBA1C 9.7 (H) 03/01/2020    Review of Glycemic Control Results for Tiffany Robles, Tiffany Robles (MRN [id number]) as of 03/01/2020 14:52  Ref. Range 03/01/2020 00:32 03/01/2020 01:46 03/01/2020 02:55 03/01/2020 07:55 03/01/2020 12:07  Glucose-Capillary Latest Ref Range: 70 - 99 mg/dL 165 (H) 112 (H) 91 81 313 (H) Novolog 11 units   Diabetes history: DM2 Outpatient Diabetes medications: Tresiba 10 units daily + Trulicity 3 mg Qweek (Friday), Metformin 1000 mg BID, Glipizide 5 mg BID Current orders for Inpatient glycemic control: Lantus 30 units + Novolog moderate correction tid  Inpatient Diabetes Program Recommendations:   -Decrease Lantus to 25 units -Add Novolog 4 units tid meal coverage if eats 50% meals -Decrease Novolog correction to sensitive 0-9 units tid + hs 0-5 units  Spoke with patient via phone regarding diabetes management @ home. Patient has been taking [place] daily @ home. Reviewed A1c of 9.7 (average blood glucose 232 over the past 2-3 months after starting on steroids). Reviewed difference in short acting insulin and long acting insulin and correction scale of short acting if prescribed. Patient has taken short acting insulin based on correction scale in the past and is willing to take as needed. Patient has glucometer and strips @ home and reviewed hypoglycemia protocol with patient.  Thank you, Nani Gasser. Rachael Ferrie, RN, MSN, CDE  Diabetes Coordinator Inpatient Glycemic Control Team Team Pager [phone] (8am-5pm) 03/01/2020 3:12 PM

## 2020-03-01 NOTE — Progress Notes (Addendum)
PROGRESS NOTE    Tiffany Robles  [id number] DOB: 1956-10-02 DOA: 02/29/2020 PCP: Vassie Moment, MD     Brief Narrative:  Tiffany Robles is a 64 y.o. WF PMHx Depression, Anxiety, Syncope and Collapse, Cervical Cancer (30 years ago) DM type II uncontrolled with complication, HTN, HLD, CKD stage II, OA LEFT knee  Who has been placed on prednisone for the last 3 months on a taper dose by patient's orthopedic surgeon after patient developed some swelling after the surgery has been noticing increasing blood sugar over the last 3 weeks.  Patient does take [place] which patient herself increase the dose despite which patient blood sugar has been running more than 300s and over the last couple days more than 400s.  Has mild weakness but denies any chest pain nausea vomiting shortness of breath.  Patient states she also has gained at least 40 pounds after the prednisone was started.  ED Course: In the ER blood sugar was six seventy-five anion gap of eleven creatinine 1.2 WBC 14.5.  EKG shows normal sinus rhythm.  Given the markedly elevated blood sugar patient was started on insulin infusion admitted for uncontrolled diabetes with possible developing hyperosmolar state.   Subjective: A/O x4, negative CP, negative S OB, negative abdominal pain.  Patient states had LEFT TKA October 2020.  Several weeks ago knee began to swell extensively.  Was placed on prednisone by Dr. Elsie Saas, [facility] Orthopedic surgery.   Assessment & Plan:   Principal Problem:   Hyperosmolar non-ketotic state due to type 2 diabetes mellitus ([place]) Active Problems:   Essential hypertension   Anxiety   HLD (hyperlipidemia)   Diabetes mellitus type 2, uncontrolled, with complications (HCC)   Depression   Syncope and collapse   AKI (acute kidney injury) ([place])  HHNS/DM type II uncontrolled with complication -4/1 hemoglobin A1c= 9.7 -HH NS secondary to patient being placed on high-dose prednisone p.o.  taper. -Patient now off Endo tool -Stepdown bed no longer required -Lantus 25 units daily -NovoLog 10 units qhs -Resistant SSI  -Even prior resistant to steroid taper patient was not well controlled will discharge on insulin and allow PCP to titrate. -4/1 diabetic coordinator consult; uncontrolled diabetic prior to starting her prednisone taper now significantly uncontrolled. -4/1 diabetic nutrition;uncontrolled diabetic prior to starting her prednisone taper now significantly uncontrolled.  Essential HTN -4/1 HCTZ 25 mg daily -4/1 lisinopril 20 mg daily  HLD -Lipid panel pending -Lipitor 40 mg daily  AKI (baseline Cr 0.95) Recent Labs  Lab 02/29/20 2011 03/01/20 0047 03/01/20 0650  CREATININE 1.20* 1.07* 1.00  -Basically at baseline with hydration -Continue normal saline 53ml/hr  Hypokalemia -Potassium goal> 4 -K-Dur 40 mEq  Hypomagnesmia -Magnesium goal> 2 -Magnesium IV 3 g    DVT prophylaxis: Lovenox Code Status: Full Family Communication:  Disposition Plan:  1.  Where the patient is from; home 2.  Anticipated d/c place. 3.  Barriers to d/c patient's DM medications to be adjusted,.diabetic coordinator and diabetic nutrition to see patient prior to discharge.   Consultants:  DM coordinator Nutrition  Procedures/Significant Events:    I have personally reviewed and interpreted all radiology studies and my findings are as above.  VENTILATOR SETTINGS:    Cultures   Antimicrobials: Anti-infectives (From admission, onward)   None       Devices    LINES / TUBES:      Continuous Infusions: . sodium chloride Stopped (03/01/20 0211)  . magnesium sulfate bolus IVPB       Objective: Vitals:  03/01/20 1030 03/01/20 1100 03/01/20 1115 03/01/20 1130  BP: 130/71 (!) 172/83  (!) 166/79  Pulse: 80  71 77  Resp: 17  12 19   Temp:      TempSrc:      SpO2: 96%  95% 98%    Intake/Output Summary (Last 24 hours) at 03/01/2020 1208 Last data  filed at 03/01/2020 0400 Gross per 24 hour  Intake 1206.22 ml  Output --  Net 1206.22 ml   There were no vitals filed for this visit.  Examination:  General: A/O x4, no acute respiratory distress Eyes: negative scleral hemorrhage, negative anisocoria, negative icterus ENT: Negative Runny nose, negative gingival bleeding, Neck:  Negative scars, masses, torticollis, lymphadenopathy, JVD Lungs: Clear to auscultation bilaterally without wheezes or crackles Cardiovascular: Regular rate and rhythm without murmur gallop or rub normal S1 and S2 Abdomen: negative abdominal pain, nondistended, positive soft, bowel sounds, no rebound, no ascites, no appreciable mass Extremities: No significant cyanosis, clubbing, or edema bilateral lower extremities.  Well-healed vertical incision on left knee Skin: Negative rashes, lesions, ulcers Psychiatric:  Negative depression, negative anxiety, negative fatigue, negative mania  Central nervous system:  Cranial nerves II through XII intact, tongue/uvula midline, all extremities muscle strength 5/5, sensation intact throughout, , negative dysarthria, negative expressive aphasia, negative receptive aphasia.  .     Data Reviewed: Care during the described time interval was provided by me .  I have reviewed this patient's available data, including medical history, events of note, physical examination, and all test results as part of my evaluation.  CBC: Recent Labs  Lab 02/29/20 2011 03/01/20 0047 03/01/20 0918  WBC 14.5* 14.1* 14.0*  NEUTROABS 11.9*  --  6.7  HGB 12.8 12.7 13.0  HCT 39.5 38.9 40.6  MCV 90.0 88.2 90.0  PLT 245 234 [id number]   Basic Metabolic Panel: Recent Labs  Lab 02/29/20 2011 03/01/20 0047 03/01/20 0650 03/01/20 0918  NA 132* 140 141  --   K 4.7 3.4* 3.3*  --   CL 97* 104 110  --   CO2 24 27 24   --   GLUCOSE 675* 162* 89  --   BUN 39* 36* 29*  --   CREATININE 1.20* 1.07* 1.00  --   CALCIUM 9.8 10.5* 9.2  --   MG  --   --   --   1.4*  PHOS  --   --   --  4.5   GFR: CrCl cannot be calculated (Unknown ideal weight.). Liver Function Tests: Recent Labs  Lab 02/29/20 2011  AST 19  ALT 27  ALKPHOS 94  BILITOT 0.6  PROT 6.4*  ALBUMIN 3.6   No results for input(s): LIPASE, AMYLASE in the last 168 hours. No results for input(s): AMMONIA in the last 168 hours. Coagulation Profile: No results for input(s): INR, PROTIME in the last 168 hours. Cardiac Enzymes: No results for input(s): CKTOTAL, CKMB, CKMBINDEX, TROPONINI in the last 168 hours. BNP (last 3 results) No results for input(s): PROBNP in the last 8760 hours. HbA1C: Recent Labs    03/01/20 0047  HGBA1C 9.7*   CBG: Recent Labs  Lab 02/29/20 2324 03/01/20 0032 03/01/20 0146 03/01/20 0255 03/01/20 0755  GLUCAP 331* 165* 112* 91 81   Lipid Profile: No results for input(s): CHOL, HDL, LDLCALC, TRIG, CHOLHDL, LDLDIRECT in the last 72 hours. Thyroid Function Tests: No results for input(s): TSH, T4TOTAL, FREET4, T3FREE, THYROIDAB in the last 72 hours. Anemia Panel: No results for input(s): VITAMINB12, FOLATE, FERRITIN, TIBC,  IRON, RETICCTPCT in the last 72 hours. Sepsis Labs: No results for input(s): PROCALCITON, LATICACIDVEN in the last 168 hours.  Recent Results (from the past 240 hour(s))  SARS CORONAVIRUS 2 (TAT 6-24 HRS) Nasopharyngeal Nasopharyngeal Swab     Status: Abnormal   Collection Time: 02/29/20  9:20 PM   Specimen: Nasopharyngeal Swab  Result Value Ref Range Status   SARS Coronavirus 2 POSITIVE (A) NEGATIVE Final    Comment: RESULT CALLED TO, READ BACK BY AND VERIFIED WITH: RN JACK [place] ON 03/01/2020 (NOTE) SARS-CoV-2 target nucleic acids are DETECTED. The SARS-CoV-2 RNA is generally detectable in upper and lower respiratory specimens during the acute phase of infection. Positive results are indicative of the presence of SARS-CoV-2 RNA. Clinical correlation with patient history and other  diagnostic information is  necessary to determine patient infection status. Positive results do not rule out bacterial infection or co-infection with other viruses.  The expected result is Negative. Fact Sheet for Patients: SugarRoll.be Fact Sheet for Healthcare Providers: https://www.[place]-mathews.com/ This test is not yet approved or cleared by the [place] FDA and  has been authorized for detection and/or diagnosis of SARS-CoV-2 by FDA under an Emergency Use Authorization (EUA). This EUA will remain  in effect (meaning this t est can be used) for the duration of the COVID-19 declaration under Section 564(b)(1) of the Act, 21 U.S.C. section 360bbb-3(b)(1), unless the authorization is terminated or revoked sooner. Performed at [facility], [place] [address] [place],  [address]          Radiology Studies: No results found.      Scheduled Meds: . atorvastatin  40 mg Oral QPM  . enoxaparin (LOVENOX) injection  40 mg Subcutaneous Daily  . escitalopram  10 mg Oral Daily  . hydrochlorothiazide  25 mg Oral Daily  . insulin aspart  0-15 Units Subcutaneous TID WC  . insulin glargine  30 Units Subcutaneous Daily  . lisinopril  20 mg Oral Daily  . potassium chloride  20 mEq Oral Once  . predniSONE  40 mg Oral Q breakfast   Followed by  . [START ON 03/04/2020] predniSONE  20 mg Oral Q breakfast  . pregabalin  150 mg Oral QHS   Continuous Infusions: . sodium chloride Stopped (03/01/20 0211)  . magnesium sulfate bolus IVPB       LOS: 0 days    Time spent:40 min    Karlea Mckibbin, Geraldo Docker, MD [facility] Pager [phone]  If 7PM-7AM, please contact night-coverage [email or web address] Password [facility] 03/01/2020, 12:08 PM

## 2020-03-01 NOTE — ED Notes (Addendum)
Pt. CBG 112, RN, Mil[place] made aware.

## 2020-03-01 NOTE — ED Notes (Signed)
Date and time results received: 03/01/20 510a  (use smartphrase ".now" to insert current time)  Test: COVID Critical Value: +  Name of Provider Notified: Hal Hope  Orders Received? Or Actions Taken?: Orders Received - See Orders for details

## 2020-03-02 DIAGNOSIS — R55 Syncope and collapse: Secondary | ICD-10-CM

## 2020-03-02 DIAGNOSIS — F320 Major depressive disorder, single episode, mild: Secondary | ICD-10-CM

## 2020-03-02 DIAGNOSIS — R739 Hyperglycemia, unspecified: Secondary | ICD-10-CM

## 2020-03-02 LAB — LIPID PANEL
Cholesterol: 131 mg/dL (ref 0–200)
HDL: 38 mg/dL — ABNORMAL LOW (ref >40)
LDL Cholesterol: 52 mg/dL (ref 0–99)
Total CHOL/HDL Ratio: 3.4 RATIO
Triglycerides: 205 mg/dL — ABNORMAL HIGH (ref <150)
VLDL: 41 mg/dL — ABNORMAL HIGH (ref 0–40)

## 2020-03-02 LAB — GLUCOSE, CAPILLARY
Glucose-Capillary: 101 mg/dL — ABNORMAL HIGH (ref 70–99)
Glucose-Capillary: 158 mg/dL — ABNORMAL HIGH (ref 70–99)
Glucose-Capillary: 206 mg/dL — ABNORMAL HIGH (ref 70–99)
Glucose-Capillary: 221 mg/dL — ABNORMAL HIGH (ref 70–99)
Glucose-Capillary: 263 mg/dL — ABNORMAL HIGH (ref 70–99)
Glucose-Capillary: 332 mg/dL — ABNORMAL HIGH (ref 70–99)
Glucose-Capillary: 89 mg/dL (ref 70–99)

## 2020-03-02 MED ORDER — METOPROLOL TARTRATE 12.5 MG HALF TABLET
12.5000 mg | ORAL_TABLET | Freq: Two times a day (BID) | ORAL | Status: DC
  Administered 2020-03-02 – 2020-03-03 (×2): 12.5 mg via ORAL
  Filled 2020-03-02 (×2): qty 1

## 2020-03-02 MED ORDER — INSULIN GLARGINE 100 UNIT/ML ~~LOC~~ SOLN
30.0000 [IU] | Freq: Every day | SUBCUTANEOUS | Status: DC
  Administered 2020-03-03: 08:00:00 30 [IU] via SUBCUTANEOUS
  Filled 2020-03-02: qty 0.3

## 2020-03-02 MED ORDER — INSULIN ASPART 100 UNIT/ML ~~LOC~~ SOLN
0.0000 [IU] | SUBCUTANEOUS | Status: DC
  Administered 2020-03-02: 21:00:00 5 [IU] via SUBCUTANEOUS
  Administered 2020-03-02: 8 [IU] via SUBCUTANEOUS
  Administered 2020-03-03: 3 [IU] via SUBCUTANEOUS
  Administered 2020-03-03: 2 [IU] via SUBCUTANEOUS
  Administered 2020-03-03: 5 [IU] via SUBCUTANEOUS

## 2020-03-02 MED ORDER — INSULIN ASPART 100 UNIT/ML ~~LOC~~ SOLN
15.0000 [IU] | Freq: Three times a day (TID) | SUBCUTANEOUS | Status: DC
  Administered 2020-03-02 – 2020-03-03 (×3): 15 [IU] via SUBCUTANEOUS

## 2020-03-02 NOTE — Plan of Care (Signed)
Nutrition Education Note RD consulted for nutrition education regarding diabetes.   Lab Results  Component Value Date   HGBA1C 9.7 (H) 03/01/2020    RD provided "Heart Healthy Consistent Carbohydrate" handout from the Academy of Nutrition and Dietetics. Discussed different food groups and their effects on blood sugar, emphasizing carbohydrate-containing foods. Provided list of carbohydrates and recommended serving sizes of common foods.  Reviewed patient's dietary recall. Provided examples on ways to decrease sodium and fat intake in diet. Discussed importance of controlled and consistent carbohydrate intake throughout the day. Provided examples of ways to balance meals/snacks and encouraged intake of fresh fruits and vegetables as well as whole grain complex carbohydrates to maximize fiber intake. Teach back method used.  Expect good compliance.  Current diet order is CM, patient is consuming approximately 100% of meals at this time. Labs and medications reviewed. No further nutrition interventions warranted at this time. RD contact information provided. If additional nutrition issues arise, please re-consult RD.  Lajuan Lines, RD, LDN Clinical Nutrition After Hours/Weekend Pager # in [place]

## 2020-03-02 NOTE — Progress Notes (Signed)
PROGRESS NOTE    Tiffany Robles  [id number] DOB: Jul 19, 1956 DOA: 02/29/2020 PCP: Vassie Moment, MD     Brief Narrative:  Tiffany Robles is a 64 y.o. WF PMHx Depression, Anxiety, Syncope and Collapse, Cervical Cancer (30 years ago) DM type II uncontrolled with complication, HTN, HLD, CKD stage II, OA LEFT knee  Who has been placed on prednisone for the last 3 months on a taper dose by patient's orthopedic surgeon after patient developed some swelling after the surgery has been noticing increasing blood sugar over the last 3 weeks.  Patient does take [place] which patient herself increase the dose despite which patient blood sugar has been running more than 300s and over the last couple days more than 400s.  Has mild weakness but denies any chest pain nausea vomiting shortness of breath.  Patient states she also has gained at least 40 pounds after the prednisone was started.  ED Course: In the ER blood sugar was six seventy-five anion gap of eleven creatinine 1.2 WBC 14.5.  EKG shows normal sinus rhythm.  Given the markedly elevated blood sugar patient was started on insulin infusion admitted for uncontrolled diabetes with possible developing hyperosmolar state.   Subjective: 4/2A/O x4, negative CP, negative S OB, negative abdominal pain.    Assessment & Plan:   Principal Problem:   Hyperosmolar non-ketotic state due to type 2 diabetes mellitus ([name]) Active Problems:   Essential hypertension   Anxiety   HLD (hyperlipidemia)   Diabetes mellitus type 2, uncontrolled, with complications (HCC)   Depression   Syncope and collapse   AKI (acute kidney injury) ([name])  HHNS/DM type II uncontrolled with complication -4/1 hemoglobin A1c= 9.7 -HH NS secondary to patient being placed on high-dose prednisone p.o. taper. -Patient now off Endo tool -Even prior to steroid taper patient was not well controlled will discharge on insulin and allow PCP to titrate. -4/1 diabetic coordinator consult;  uncontrolled diabetic prior to starting her prednisone taper now significantly uncontrolled. -4/1 diabetic nutrition;uncontrolled diabetic prior to starting her prednisone taper now significantly uncontrolled. -4/2 increase Lantus 30 units daily  -4/2 increase NovoLog 15 units qhs -4/2 decrease Moderate SSI    Essential HTN -4/1 HCTZ 25 mg daily -4/1 lisinopril 20 mg daily -4/2 Metoprolol 12.5 mg BID  HLD -Lipid panel pending -Lipitor 40 mg daily  AKI (baseline Cr 0.95) Recent Labs  Lab 02/29/20 2011 03/01/20 0047 03/01/20 0650  CREATININE 1.20* 1.07* 1.00  -Basically at baseline with hydration -continue normal saline 70ml/hr  Hypokalemia -Potassium goal> 4 -K-Dur 40 mEq  Hypomagnesmia -Magnesium goal> 2 -Magnesium IV 3 g    DVT prophylaxis: Lovenox Code Status: Full Family Communication:  Disposition Plan:  1.  Where the patient is from; home 2.  Anticipated d/c place. 3.  Barriers to d/c patient's DM medications to be adjusted,.diabetic coordinator and diabetic nutrition to see patient prior to discharge.   Consultants:  DM coordinator Nutrition  Procedures/Significant Events:    I have personally reviewed and interpreted all radiology studies and my findings are as above.  VENTILATOR SETTINGS:    Cultures   Antimicrobials: Anti-infectives (From admission, onward)   None       Devices    LINES / TUBES:      Continuous Infusions: . sodium chloride Stopped (03/02/20 0941)     Objective: Vitals:   03/02/20 0005 03/02/20 0401 03/02/20 0856 03/02/20 1155  BP: (!) 149/53 (!) 148/73 139/71 (!) 147/67  Pulse: 100 78 87 81  Resp: 13 16  16 20  Temp: 98.2 F (36.8 C) 98.3 F (36.8 C) 98.3 F (36.8 C) 97.8 F (36.6 C)  TempSrc: Oral Oral Oral Oral  SpO2: 100% 99% 96% 96%    Intake/Output Summary (Last 24 hours) at 03/02/2020 1655 Last data filed at 03/02/2020 1523 Gross per 24 hour  Intake 1777.17 ml  Output --  Net 1777.17 ml    There were no vitals filed for this visit.  Physical Exam:  General: A/O x4 no acute respiratory distress Eyes: negative scleral hemorrhage, negative anisocoria, negative icterus ENT: Negative Runny nose, negative gingival bleeding, Neck:  Negative scars, masses, torticollis, lymphadenopathy, JVD Lungs: Clear to auscultation bilaterally without wheezes or crackles Cardiovascular: Regular rate and rhythm without murmur gallop or rub normal S1 and S2 Abdomen: negative abdominal pain, nondistended, positive soft, bowel sounds, no rebound, no ascites, no appreciable mass Extremities: No significant cyanosis, clubbing, or edema bilateral lower extremities Skin: Negative rashes, lesions, ulcers Psychiatric:  Negative depression, negative anxiety, negative fatigue, negative mania  Central nervous system:  Cranial nerves II through XII intact, tongue/uvula midline, all extremities muscle strength 5/5, sensation intact throughout, negative dysarthria, negative expressive aphasia, negative receptive aphasia.  .     Data Reviewed: Care during the described time interval was provided by me .  I have reviewed this patient's available data, including medical history, events of note, physical examination, and all test results as part of my evaluation.  CBC: Recent Labs  Lab 02/29/20 2011 03/01/20 0047 03/01/20 0918  WBC 14.5* 14.1* 14.0*  NEUTROABS 11.9*  --  6.7  HGB 12.8 12.7 13.0  HCT 39.5 38.9 40.6  MCV 90.0 88.2 90.0  PLT 245 234 [id number]   Basic Metabolic Panel: Recent Labs  Lab 02/29/20 2011 03/01/20 0047 03/01/20 0650 03/01/20 0918  NA 132* 140 141  --   K 4.7 3.4* 3.3*  --   CL 97* 104 110  --   CO2 24 27 24   --   GLUCOSE 675* 162* 89  --   BUN 39* 36* 29*  --   CREATININE 1.20* 1.07* 1.00  --   CALCIUM 9.8 10.5* 9.2  --   MG  --   --   --  1.4*  PHOS  --   --   --  4.5   GFR: CrCl cannot be calculated (Unknown ideal weight.). Liver Function Tests: Recent Labs  Lab  02/29/20 2011  AST 19  ALT 27  ALKPHOS 94  BILITOT 0.6  PROT 6.4*  ALBUMIN 3.6   No results for input(s): LIPASE, AMYLASE in the last 168 hours. No results for input(s): AMMONIA in the last 168 hours. Coagulation Profile: No results for input(s): INR, PROTIME in the last 168 hours. Cardiac Enzymes: No results for input(s): CKTOTAL, CKMB, CKMBINDEX, TROPONINI in the last 168 hours. BNP (last 3 results) No results for input(s): PROBNP in the last 8760 hours. HbA1C: Recent Labs    03/01/20 0047  HGBA1C 9.7*   CBG: Recent Labs  Lab 03/02/20 0104 03/02/20 0404 03/02/20 0736 03/02/20 1201 03/02/20 1649  GLUCAP 158* 221* 101* 332* 263*   Lipid Profile: Recent Labs    03/02/20 0504  CHOL 131  HDL 38*  LDLCALC 52  TRIG 205*  CHOLHDL 3.4   Thyroid Function Tests: No results for input(s): TSH, T4TOTAL, FREET4, T3FREE, THYROIDAB in the last 72 hours. Anemia Panel: No results for input(s): VITAMINB12, FOLATE, FERRITIN, TIBC, IRON, RETICCTPCT in the last 72 hours. Sepsis Labs: No results for  input(s): PROCALCITON, LATICACIDVEN in the last 168 hours.  Recent Results (from the past 240 hour(s))  SARS CORONAVIRUS 2 (TAT 6-24 HRS) Nasopharyngeal Nasopharyngeal Swab     Status: Abnormal   Collection Time: 02/29/20  9:20 PM   Specimen: Nasopharyngeal Swab  Result Value Ref Range Status   SARS Coronavirus 2 POSITIVE (A) NEGATIVE Final    Comment: RESULT CALLED TO, READ BACK BY AND VERIFIED WITH: RN JACK [place] ON 03/01/2020 (NOTE) SARS-CoV-2 target nucleic acids are DETECTED. The SARS-CoV-2 RNA is generally detectable in upper and lower respiratory specimens during the acute phase of infection. Positive results are indicative of the presence of SARS-CoV-2 RNA. Clinical correlation with patient history and other diagnostic information is  necessary to determine patient infection status. Positive results do not rule out bacterial infection or  co-infection with other viruses.  The expected result is Negative. Fact Sheet for Patients: SugarRoll.be Fact Sheet for Healthcare Providers: https://www.[place]-mathews.com/ This test is not yet approved or cleared by the [place] FDA and  has been authorized for detection and/or diagnosis of SARS-CoV-2 by FDA under an Emergency Use Authorization (EUA). This EUA will remain  in effect (meaning this t est can be used) for the duration of the COVID-19 declaration under Section 564(b)(1) of the Act, 21 U.S.C. section 360bbb-3(b)(1), unless the authorization is terminated or revoked sooner. Performed at [facility], [place] [address], [place], [place] [address]          Radiology Studies: No results found.      Scheduled Meds: . atorvastatin  40 mg Oral QPM  . enoxaparin (LOVENOX) injection  40 mg Subcutaneous Daily  . escitalopram  10 mg Oral Daily  . hydrochlorothiazide  25 mg Oral Daily  . insulin aspart  0-20 Units Subcutaneous Q4H  . insulin aspart  0-5 Units Subcutaneous QHS  . insulin aspart  10 Units Subcutaneous TID WC  . insulin glargine  25 Units Subcutaneous Daily  . lisinopril  20 mg Oral Daily  . predniSONE  40 mg Oral Q breakfast   Followed by  . [START ON 03/04/2020] predniSONE  20 mg Oral Q breakfast  . pregabalin  150 mg Oral QHS   Continuous Infusions: . sodium chloride Stopped (03/02/20 0941)     LOS: 1 day    Time spent:40 min    Krishana Lutze, Geraldo Docker, MD [facility] Pager [phone]  If 7PM-7AM, please contact night-coverage [email or web address] Password [facility] 03/02/2020, 4:55 PM

## 2020-03-03 LAB — COMPREHENSIVE METABOLIC PANEL
ALT: 33 U/L (ref 0–44)
AST: 22 U/L (ref 15–41)
Albumin: 3.3 g/dL — ABNORMAL LOW (ref 3.5–5.0)
Alkaline Phosphatase: 57 U/L (ref 38–126)
Anion gap: 9 (ref 5–15)
BUN: 33 mg/dL — ABNORMAL HIGH (ref 8–23)
CO2: 22 mmol/L (ref 22–32)
Calcium: 8.8 mg/dL — ABNORMAL LOW (ref 8.9–10.3)
Chloride: 106 mmol/L (ref 98–111)
Creatinine, Ser: 1.06 mg/dL — ABNORMAL HIGH (ref 0.44–1.00)
GFR calc Af Amer: >60 mL/min (ref >60)
GFR calc non Af Amer: 55 mL/min — ABNORMAL LOW (ref >60)
Glucose, Bld: 195 mg/dL — ABNORMAL HIGH (ref 70–99)
Potassium: 3.7 mmol/L (ref 3.5–5.1)
Sodium: 137 mmol/L (ref 135–145)
Total Bilirubin: 0.3 mg/dL (ref 0.3–1.2)
Total Protein: 5.9 g/dL — ABNORMAL LOW (ref 6.5–8.1)

## 2020-03-03 LAB — GLUCOSE, CAPILLARY
Glucose-Capillary: 139 mg/dL — ABNORMAL HIGH (ref 70–99)
Glucose-Capillary: 194 mg/dL — ABNORMAL HIGH (ref 70–99)
Glucose-Capillary: 205 mg/dL — ABNORMAL HIGH (ref 70–99)

## 2020-03-03 LAB — CBC WITH DIFFERENTIAL/PLATELET
Abs Immature Granulocytes: 0.1 10*3/uL — ABNORMAL HIGH (ref 0.00–0.07)
Basophils Absolute: 0.1 10*3/uL (ref 0.0–0.1)
Basophils Relative: 0 %
Eosinophils Absolute: 0.2 10*3/uL (ref 0.0–0.5)
Eosinophils Relative: 2 %
HCT: 40.7 % (ref 36.0–46.0)
Hemoglobin: 12.7 g/dL (ref 12.0–15.0)
Immature Granulocytes: 1 %
Lymphocytes Relative: 37 %
Lymphs Abs: 5.4 10*3/uL — ABNORMAL HIGH (ref 0.7–4.0)
MCH: 28.2 pg (ref 26.0–34.0)
MCHC: 31.2 g/dL (ref 30.0–36.0)
MCV: 90.2 fL (ref 80.0–100.0)
Monocytes Absolute: 0.7 10*3/uL (ref 0.1–1.0)
Monocytes Relative: 4 %
Neutro Abs: 8.3 10*3/uL — ABNORMAL HIGH (ref 1.7–7.7)
Neutrophils Relative %: 56 %
Platelets: 234 10*3/uL (ref 150–400)
RBC: 4.51 MIL/uL (ref 3.87–5.11)
RDW: 13.3 % (ref 11.5–15.5)
WBC: 14.8 10*3/uL — ABNORMAL HIGH (ref 4.0–10.5)
nRBC: 0 % (ref 0.0–0.2)

## 2020-03-03 LAB — MAGNESIUM: Magnesium: 1.7 mg/dL (ref 1.7–2.4)

## 2020-03-03 LAB — PHOSPHORUS: Phosphorus: 3.3 mg/dL (ref 2.5–4.6)

## 2020-03-03 MED ORDER — INSULIN ASPART 100 UNIT/ML ~~LOC~~ SOLN: 15.0000 [IU] | Freq: Three times a day (TID) | SUBCUTANEOUS | 11 refills | Status: DC

## 2020-03-03 MED ORDER — INSULIN GLARGINE 100 UNIT/ML ~~LOC~~ SOLN: 30.0000 [IU] | Freq: Every day | SUBCUTANEOUS | 11 refills | Status: DC

## 2020-03-03 MED ORDER — IBUPROFEN 600 MG PO TABS: 600.0000 mg | ORAL_TABLET | Freq: Three times a day (TID) | ORAL | 0 refills | Status: DC | PRN

## 2020-03-03 MED ORDER — METOPROLOL TARTRATE 25 MG PO TABS: 12.5000 mg | ORAL_TABLET | Freq: Two times a day (BID) | ORAL | 0 refills | Status: DC

## 2020-03-03 NOTE — Discharge Summary (Signed)
Physician Discharge Summary  Patient ID: Tiffany Robles MRN: [id number] DOB/AGE: [birth year] 64 y.o.  Admit date: 02/29/2020 Discharge date: 03/03/2020  Discharge Diagnoses:  Principal Problem:   Hyperosmolar non-ketotic state due to type 2 diabetes mellitus ([place]) Active Problems:   Essential hypertension   Anxiety   HLD (hyperlipidemia)   Diabetes mellitus type 2, uncontrolled, with complications ([facility])   Depression   Syncope and collapse   AKI (acute kidney injury) ([place])   Discharged Condition: Stable.  Hospital Course: Patient is a 64 year old Caucasian female with past medical history significant for depression, anxiety, Syncope and Collapse, Cervical Cancer (30 years ago) DM type II uncontrolled with complication, HTN, HLD, CKD stage II, OA LEFT knee.  3 months prior to presentation, patient was started on prednisone by the orthopedic surgery team.  Subsequently, patient noticed increasing blood sugar that started about 3 weeks prior to presentation. Patient was on [place] prior to presentation, with that adequate control of blood sugar.  The blood sugar was said to be running in 300-400 range.  Patient also reported about 40 pound weight gain following prednisone therapy.  On presentation to the hospital, patient's blood sugar was 675.  Patient was admitted for further assessment and management.  Patient was managed with insulin drip.  Patient's insulin regimen was eventually changed to subcutaneous Lantus and NovoLog during the hospital stay, with sliding scale insulin coverage.  Steroid taper was also started.  HbA1c was 9.1%.  Patient has been optimized and will be discharged back to the care of the primary care provider.  Essential HTN -Blood pressure was optimized during the hospital stay.  Patient was managed with HCTZ, lisinopril and metoprolol.  Will defer further management to the primary care provider.    HLD -On statin.    AKI (baseline Cr 0.95) Last Labs        Recent  Labs  Lab 02/29/20 2011 03/01/20 0047 03/01/20 0650  CREATININE 1.20* 1.07* 1.00    -Basically at baseline with hydration -AKI resolved during the hospital stay.    Hypokalemia/hypomagnesemia:              -Electrolytes were monitored and repleted during the hospital stay  Consults: None  Discharge Exam: Blood pressure 124/60, pulse 74, temperature 97.8 F (36.6 C), resp. rate 20, SpO2 95 %.  Disposition: Discharge disposition: 01-Home or Self Care   Discharge Instructions    Diet - low sodium heart healthy   Complete by: As directed    Diet Carb Modified   Complete by: As directed    Increase activity slowly   Complete by: As directed      Allergies as of 03/03/2020      Reactions   Penicillins Swelling, Other (See Comments)   Facial swelling Did it involve swelling of the face/tongue/throat, SOB, or low BP? Yes Did it involve sudden or severe rash/hives, skin peeling, or any reaction on the inside of your mouth or nose? No Did you need to seek medical attention at a hospital or doctor's office?    #  #  YES  #  #  When did it last happen?10+ years If all above answers are "NO", may proceed with cephalosporin use.   Augmentin [amoxicillin-pot Clavulanate] Nausea And Vomiting   Codeine Nausea And Vomiting   Ivp Dye [iodinated Diagnostic Agents] Hives, Itching   Just contrast dye , no reactions to other -dines    Keflex [cephalexin] Nausea And Vomiting   Oxycodone    Headaches  Medication List    STOP taking these medications   aspirin 325 MG EC tablet   celecoxib 200 MG capsule Commonly known as: CELEBREX   docusate sodium 100 MG capsule Commonly known as: COLACE   gabapentin 300 MG capsule Commonly known as: NEURONTIN   glipiZIDE 5 MG tablet Commonly known as: GLUCOTROL   oxyCODONE 5 MG immediate release tablet Commonly known as: Oxy IR/ROXICODONE   polyethylene glycol 17 g packet Commonly known as: MIRALAX / GLYCOLAX   Tresiba  FlexTouch 100 UNIT/ML FlexTouch Pen Generic drug: insulin degludec     TAKE these medications   atorvastatin 40 MG tablet Commonly known as: LIPITOR Take 40 mg by mouth every evening.   CALCIUM 600 + D PO Take 1 tablet by mouth 2 (two) times daily.   escitalopram 10 MG tablet Commonly known as: LEXAPRO Take 10 mg by mouth daily.   ibuprofen 600 MG tablet Commonly known as: ADVIL Take 1 tablet (600 mg total) by mouth 3 (three) times daily as needed for mild pain. What changed:   medication strength  how much to take  when to take this  reasons to take this   insulin aspart 100 UNIT/ML injection Commonly known as: novoLOG Inject 15 Units into the skin 3 (three) times daily with meals.   insulin glargine 100 UNIT/ML injection Commonly known as: LANTUS Inject 0.3 mLs (30 Units total) into the skin daily. Start taking on: March 04, 2020   lisinopril-hydrochlorothiazide 20-25 MG tablet Commonly known as: ZESTORETIC Take 1 tablet by mouth daily.   metFORMIN 500 MG 24 hr tablet Commonly known as: GLUCOPHAGE-XR Take 1,000 mg by mouth 2 (two) times daily.   metoprolol tartrate 25 MG tablet Commonly known as: LOPRESSOR Take 0.5 tablets (12.5 mg total) by mouth 2 (two) times daily.   multivitamin with minerals Tabs tablet Take 1 tablet by mouth daily.   predniSONE 10 MG tablet Commonly known as: DELTASONE Take 10 mg by mouth See admin instructions. Day 1(02/25/20) : Take 6 tablets by mouth daily for 4 days, then take 4 tablets by mouth daily for 4 days, then take 2 tablets by mouth for 3 days. Complete course by (03/06/20) per patient.   pregabalin 150 MG capsule Commonly known as: LYRICA Take 150 mg by mouth at bedtime.   Trulicity 1.5 0000000 Sopn Generic drug: Dulaglutide Inject 3 mg into the skin every Friday.        SignedBonnell Public 03/03/2020, 2:12 PM

## 2020-03-03 NOTE — Progress Notes (Signed)
Pt being discharged to home. Discharge instructions and medication education provided to pt.  

## 2020-03-16 ENCOUNTER — Other Ambulatory Visit: Payer: Self-pay | Admitting: Family Medicine

## 2020-03-16 DIAGNOSIS — Z1231 Encounter for screening mammogram for malignant neoplasm of breast: Secondary | ICD-10-CM

## 2020-04-19 ENCOUNTER — Ambulatory Visit
Admission: RE | Admit: 2020-04-19 | Discharge: 2020-04-19 | Disposition: A | Discharge status: Home / Self Care | Payer: Medicare HMO | Source: Ambulatory Visit | Attending: Family Medicine | Admitting: Family Medicine

## 2020-04-19 ENCOUNTER — Other Ambulatory Visit: Payer: Self-pay

## 2020-04-19 DIAGNOSIS — Z1231 Encounter for screening mammogram for malignant neoplasm of breast: Secondary | ICD-10-CM

## 2020-05-31 DIAGNOSIS — M5440 Lumbago with sciatica, unspecified side: Secondary | ICD-10-CM | POA: Insufficient documentation

## 2020-06-18 ENCOUNTER — Telehealth: Payer: Self-pay

## 2020-06-18 ENCOUNTER — Other Ambulatory Visit: Payer: Self-pay | Admitting: Neurosurgery

## 2020-06-18 DIAGNOSIS — M7138 Other bursal cyst, other site: Secondary | ICD-10-CM

## 2020-06-18 NOTE — Telephone Encounter (Signed)
Spoke with patient to review her medications and drug allergies before getting her scheduled for a lumbar myelogram.  She stated an understanding she will be here two hours, needs a driver and will need to be on strict bedrest (explained) for 24 hours after the procedure.  She also stated an understanding she needs to hold escitalpram/lexapro for 48 hours before, and 24 hours after, the myelogram.  Finally, she has an allergy to iodinated contrast, so we will call in a 13-hour prep once she is scheduled.

## 2020-06-18 NOTE — Telephone Encounter (Signed)
Pt's VM not set up, so I was unable to review the 13-hour prep instructions with her.

## 2020-06-18 NOTE — Telephone Encounter (Signed)
13-hr prep called to pt's [facility] in [place].  Prednisone 50mg  PO 06/25/20 @ 2130, 06/26/20 @ 0330 and 0930; Benadrly 50mg  PO 06/26/20 @ 0930.

## 2020-06-19 NOTE — Telephone Encounter (Signed)
Spoke with patient to review her 13-hr prep instructions.  She has our number if she has any questions.

## 2020-06-26 ENCOUNTER — Ambulatory Visit
Admission: RE | Admit: 2020-06-26 | Discharge: 2020-06-26 | Disposition: A | Discharge status: Home / Self Care | Payer: Medicare HMO | Source: Ambulatory Visit | Attending: Neurosurgery | Admitting: Neurosurgery

## 2020-06-26 VITALS — BP 155/69 | HR 93

## 2020-06-26 DIAGNOSIS — M7138 Other bursal cyst, other site: Secondary | ICD-10-CM

## 2020-06-26 DIAGNOSIS — M48061 Spinal stenosis, lumbar region without neurogenic claudication: Secondary | ICD-10-CM

## 2020-06-26 MED ORDER — DIAZEPAM 5 MG PO TABS
5.0000 mg | ORAL_TABLET | Freq: Once | ORAL | Status: AC
  Administered 2020-06-26: 5 mg via ORAL

## 2020-06-26 MED ORDER — IOPAMIDOL (ISOVUE-M 200) INJECTION 41%
9.0000 mL | Freq: Once | INTRAMUSCULAR | Status: AC
  Administered 2020-06-26: 9 mL via INTRATHECAL

## 2020-06-26 MED ORDER — ONDANSETRON HCL 4 MG/2ML IJ SOLN: 4.0000 mg | Freq: Four times a day (QID) | INTRAMUSCULAR | Status: DC | PRN

## 2020-06-26 NOTE — Discharge Instructions (Signed)

## 2020-06-26 NOTE — Progress Notes (Signed)
Patient states she has been off Lexapro for at least the past two days.  She also states she took the 13-hour prep as prescribed for her contrast allergy.

## 2020-07-19 ENCOUNTER — Other Ambulatory Visit: Payer: Self-pay | Admitting: Neurosurgery

## 2020-08-03 NOTE — Pre-Procedure Instructions (Signed)
Your procedure is scheduled on Wednesday, September 8 from 08:30 AM- 11:43 AM.  Report to [facility]tr[facility] "A" at 06:30 A.M., and check in at the Admitting office.  Call this number if you have problems the morning of surgery:  [phone]  Call [phone] if you have any questions prior to your surgery date Monday-Friday 8am-4pm.    Remember:  Do not eat or drink after midnight the night before your surgery.     Take these medicines the morning of surgery with A SIP OF WATER: escitalopram (LEXAPRO)  gabapentin (NEURONTIN) hydrOXYzine (ATARAX/VISTARIL) metoprolol tartrate (LOPRESSOR)  IF NEEDED: clonazePAM (KLONOPIN) cyclobenzaprine (FLEXERIL) oxycodone (OXY-IR)  As of today, STOP taking any Aspirin (unless otherwise instructed by your surgeon) Aleve, Naproxen, Ibuprofen, Motrin, Advil, Goody's, BC's, all herbal medications, fish oil, and all vitamins.   WHAT DO I DO ABOUT MY DIABETES MEDICATION?  . THE NIGHT BEFORE SURGERY, do not take bedtime dose of NOVOLOG insulin.      . THE MORNING OF SURGERY, take 15 units of LANTUS SOLOSTAR.  . The day of surgery, do not take other diabetes injectables, including Trulicity (dulaglutide). If your CBG is greater than 220 mg/dL, you may take  of your sliding scale (correction) dose of NOVOLOG insulin.   HOW TO MANAGE YOUR DIABETES BEFORE AND AFTER SURGERY  Why is it important to control my blood sugar before and after surgery? . Improving blood sugar levels before and after surgery helps healing and can limit problems. . A way of improving blood sugar control is eating a healthy diet by: o  Eating less sugar and carbohydrates o  Increasing activity/exercise o  Talking with your doctor about reaching your blood sugar goals . High blood sugars (greater than 180 mg/dL) can raise your risk of infections and slow your recovery, so you will need to focus on controlling your diabetes during the weeks before surgery. . Make  sure that the doctor who takes care of your diabetes knows about your planned surgery including the date and location.  How do I manage my blood sugar before surgery? . Check your blood sugar at least 4 times a day, starting 2 days before surgery, to make sure that the level is not too high or low. . Check your blood sugar the morning of your surgery when you wake up and every 2 hours until you get to the Short Stay unit. o If your blood sugar is less than 70 mg/dL, you will need to treat for low blood sugar: - Do not take insulin. - Treat a low blood sugar (less than 70 mg/dL) with  cup of clear juice (cranberry or apple), 4 glucose tablets, OR glucose gel. - Recheck blood sugar in 15 minutes after treatment (to make sure it is greater than 70 mg/dL). If your blood sugar is not greater than 70 mg/dL on recheck, call [phone] for further instructions. . Report your blood sugar to the short stay nurse when you get to Short Stay.  . If you are admitted to the hospital after surgery: o Your blood sugar will be checked by the staff and you will probably be given insulin after surgery (instead of oral diabetes medicines) to make sure you have good blood sugar levels. o The goal for blood sugar control after surgery is 80-180 mg/dL.           The Morning of Surgery:             Do not wear  jewelry, make up, or nail polish.            Do not wear lotions, powders, perfumes, or deodorant.            Do not shave 48 hours prior to surgery.             Do not bring valuables to the hospital.            [facility] is not responsible for any belongings or valuables.  Do NOT Smoke (Tobacco/Vaping) or drink Alcohol 24 hours prior to your procedure. If you use a CPAP at night, you may bring all equipment for your overnight stay.   Contacts, glasses, dentures or bridgework may not be worn into surgery.      For patients admitted to the hospital, discharge time will be determined by your treatment  team.   Patients discharged the day of surgery will not be allowed to drive home, and someone needs to stay with them for 24 hours.    Special instructions:   [facility]- Preparing For Surgery  Before surgery, you can play an important role. Because skin is not sterile, your skin needs to be as free of germs as possible. You can reduce the number of germs on your skin by washing with CHG (chlorahexidine gluconate) Soap before surgery.  CHG is an antiseptic cleaner which kills germs and bonds with the skin to continue killing germs even after washing.    Oral Hygiene is also important to reduce your risk of infection.  Remember - BRUSH YOUR TEETH THE MORNING OF SURGERY WITH YOUR REGULAR TOOTHPASTE  Please do not use if you have an allergy to CHG or antibacterial soaps. If your skin becomes reddened/irritated stop using the CHG.  Do not shave (including legs and underarms) for at least 48 hours prior to first CHG shower. It is OK to shave your face.  Please follow these instructions carefully.   1. Shower the NIGHT BEFORE SURGERY and the MORNING OF SURGERY with CHG Soap.   2. If you chose to wash your hair, wash your hair first as usual with your normal shampoo.  3. After you shampoo, rinse your hair and body thoroughly to remove the shampoo.  4. Use CHG as you would any other liquid soap. You can apply CHG directly to the skin and wash gently with a scrungie or a clean washcloth.   5. Apply the CHG Soap to your body ONLY FROM THE NECK DOWN.  Do not use on open wounds or open sores. Avoid contact with your eyes, ears, mouth and genitals (private parts). Wash Face and genitals (private parts)  with your normal soap.   6. Wash thoroughly, paying special attention to the area where your surgery will be performed.  7. Thoroughly rinse your body with warm water from the neck down.  8. DO NOT shower/wash with your normal soap after using and rinsing off the CHG Soap.  9. Pat yourself dry  with a CLEAN TOWEL.  10. Wear CLEAN PAJAMAS to bed the night before surgery  11. Place CLEAN SHEETS on your bed the night of your first shower and DO NOT SLEEP WITH PETS.   Day of Surgery: Wear Clean/Comfortable clothing the morning of surgery. Do not apply any deodorants/lotions.   Remember to brush your teeth WITH YOUR REGULAR TOOTHPASTE.   Please read over the following fact sheets that you were given.

## 2020-08-07 ENCOUNTER — Other Ambulatory Visit: Payer: Self-pay

## 2020-08-07 ENCOUNTER — Encounter (HOSPITAL_COMMUNITY): Payer: Self-pay

## 2020-08-07 ENCOUNTER — Other Ambulatory Visit (HOSPITAL_COMMUNITY)
Admission: RE | Admit: 2020-08-07 | Discharge: 2020-08-07 | Disposition: A | Discharge status: Home / Self Care | Payer: Medicare HMO | Source: Ambulatory Visit | Attending: Neurosurgery | Admitting: Neurosurgery

## 2020-08-07 ENCOUNTER — Encounter (HOSPITAL_COMMUNITY)
Admission: RE | Admit: 2020-08-07 | Discharge: 2020-08-07 | Disposition: A | Discharge status: Home / Self Care | Payer: Medicare HMO | Source: Ambulatory Visit | Attending: Neurosurgery | Admitting: Neurosurgery

## 2020-08-07 DIAGNOSIS — Z01812 Encounter for preprocedural laboratory examination: Secondary | ICD-10-CM | POA: Insufficient documentation

## 2020-08-07 DIAGNOSIS — Z20822 Contact with and (suspected) exposure to covid-19: Secondary | ICD-10-CM | POA: Insufficient documentation

## 2020-08-07 HISTORY — DX: Pneumonia, unspecified organism: J18.9

## 2020-08-07 HISTORY — DX: Unspecified asthma, uncomplicated: J45.909

## 2020-08-07 LAB — HEMOGLOBIN A1C
Hgb A1c MFr Bld: 8.2 % — ABNORMAL HIGH (ref 4.8–5.6)
Mean Plasma Glucose: 188.64 mg/dL

## 2020-08-07 LAB — GLUCOSE, CAPILLARY: Glucose-Capillary: 124 mg/dL — ABNORMAL HIGH (ref 70–99)

## 2020-08-07 LAB — CBC
HCT: 40.2 % (ref 36.0–46.0)
Hemoglobin: 12.7 g/dL (ref 12.0–15.0)
MCH: 28.3 pg (ref 26.0–34.0)
MCHC: 31.6 g/dL (ref 30.0–36.0)
MCV: 89.5 fL (ref 80.0–100.0)
Platelets: 240 10*3/uL (ref 150–400)
RBC: 4.49 MIL/uL (ref 3.87–5.11)
RDW: 13 % (ref 11.5–15.5)
WBC: 10 10*3/uL (ref 4.0–10.5)
nRBC: 0 % (ref 0.0–0.2)

## 2020-08-07 LAB — BASIC METABOLIC PANEL
Anion gap: 8 (ref 5–15)
BUN: 28 mg/dL — ABNORMAL HIGH (ref 8–23)
CO2: 27 mmol/L (ref 22–32)
Calcium: 10.4 mg/dL — ABNORMAL HIGH (ref 8.9–10.3)
Chloride: 103 mmol/L (ref 98–111)
Creatinine, Ser: 1.08 mg/dL — ABNORMAL HIGH (ref 0.44–1.00)
GFR calc Af Amer: >60 mL/min (ref >60)
GFR calc non Af Amer: 53 mL/min — ABNORMAL LOW (ref >60)
Glucose, Bld: 109 mg/dL — ABNORMAL HIGH (ref 70–99)
Potassium: 4.3 mmol/L (ref 3.5–5.1)
Sodium: 138 mmol/L (ref 135–145)

## 2020-08-07 LAB — TYPE AND SCREEN
ABO/RH(D): O POS
Antibody Screen: NEGATIVE

## 2020-08-07 LAB — SURGICAL PCR SCREEN
MRSA, PCR: NEGATIVE
Staphylococcus aureus: NEGATIVE

## 2020-08-07 NOTE — Progress Notes (Signed)
Called and left VM for Lorriane Shire, with Dr. Windy Carina office to report A1C of 8.2.

## 2020-08-07 NOTE — Progress Notes (Signed)
PCP - Vassie Moment, MD Cardiologist - Denies  PPM/ICD - Denies  Chest x-ray - N/A EKG - 03/01/20 Stress Test - Per patient, ~ 10 years ago in [place] for syncope. Results were negative. ECHO - Denies Cardiac Cath - Denies  Sleep Study - Denies  Fasting Blood Sugar 90-170s  Checks Blood Sugar 4 times a day  Blood Thinner Instructions: N/A Aspirin Instructions: N/A  ERAS Protcol - N/A PRE-SURGERY Ensure or G2- N/A  COVID TEST- 08/07/20   Anesthesia review: No  Patient denies shortness of breath, fever, cough and chest pain at PAT appointment   All instructions explained to the patient, with a verbal understanding of the material. Patient agrees to go over the instructions while at home for a better understanding. Patient also instructed to self quarantine after being tested for COVID-19. The opportunity to ask questions was provided.

## 2020-08-07 NOTE — Progress Notes (Signed)
Elevated BP at PAT appointment: Pt with elevated BP, however pt does report pain. See below.   08/07/20 1113 08/07/20 1126  Vitals  Temp 98.6 F (37 C)  --   Temp Source Oral  --   Pulse Rate 75  --   Pulse Rate Source Dinamap  --   Resp 18  --   BP (!) 170/77  --   SpO2 99 %  --   Oxygen Therapy  O2 Device Room Air  --   Pain Assessment  Pain Scale  --  0-10  Pain Score  --  9  Pain Type  --  Chronic pain  Pain Location  --  Back  Pain Orientation  --  Lower;Right  Pain Descriptors / Indicators  --  Radiating;Sharp;Stabbing (radiates to right buttock and right foot)  Pain Onset  --  Other (Comment) (positional)  Patients Stated Pain Goal  --  5  Pain Intervention(s)  --  RN made aware  LOC  Level of Consciousness  --  Alert  Orientation Level  --  Oriented X4

## 2020-08-07 NOTE — Anesthesia Preprocedure Evaluation (Addendum)
Anesthesia Evaluation  Patient identified by MRN, date of birth, ID band Patient awake    Reviewed: Allergy & Precautions, NPO status , Patient's Chart, lab work & pertinent test results  History of Anesthesia Complications Negative for: history of anesthetic complications  Airway Mallampati: II  TM Distance: >3 FB Neck ROM: Full    Dental no notable dental hx. (+) Dental Advisory Given   Pulmonary former smoker,    Pulmonary exam normal        Cardiovascular hypertension, Pt. on medications Normal cardiovascular exam     Neuro/Psych PSYCHIATRIC DISORDERS Anxiety Depression    GI/Hepatic   Endo/Other  diabetes, Poorly Controlled, Type 2  Renal/GU Renal InsufficiencyRenal disease     Musculoskeletal  (+) Arthritis ,   Abdominal   Peds  Hematology   Anesthesia Other Findings   Reproductive/Obstetrics                            Anesthesia Physical  Anesthesia Plan  ASA: III  Anesthesia Plan: General   Post-op Pain Management:    Induction: Intravenous  PONV Risk Score and Plan: 4 or greater and Ondansetron, Treatment may vary due to age or medical condition, Midazolam and Dexamethasone  Airway Management Planned: Oral ETT  Additional Equipment: None  Intra-op Plan:   Post-operative Plan: Extubation in OR  Informed Consent: I have reviewed the patients History and Physical, chart, labs and discussed the procedure including the risks, benefits and alternatives for the proposed anesthesia with the patient or authorized representative who has indicated his/her understanding and acceptance.     Dental advisory given  Plan Discussed with: Anesthesiologist and CRNA  Anesthesia Plan Comments:        Anesthesia Quick Evaluation

## 2020-08-08 ENCOUNTER — Ambulatory Visit (HOSPITAL_COMMUNITY): Payer: Medicare HMO | Admitting: Vascular Surgery

## 2020-08-08 ENCOUNTER — Ambulatory Visit (HOSPITAL_COMMUNITY)
Admission: RE | Admit: 2020-08-08 | Discharge: 2020-08-09 | Disposition: A | Discharge status: Home / Self Care | Payer: Medicare HMO | Attending: Neurosurgery | Admitting: Neurosurgery

## 2020-08-08 ENCOUNTER — Encounter (HOSPITAL_COMMUNITY): Payer: Self-pay | Admitting: Neurosurgery

## 2020-08-08 ENCOUNTER — Encounter (HOSPITAL_COMMUNITY)
Admission: RE | Disposition: A | Discharge status: Home / Self Care | Payer: Self-pay | Source: Home / Self Care | Attending: Neurosurgery

## 2020-08-08 ENCOUNTER — Ambulatory Visit (HOSPITAL_COMMUNITY): Payer: Medicare HMO

## 2020-08-08 ENCOUNTER — Ambulatory Visit (HOSPITAL_COMMUNITY): Payer: Medicare HMO | Admitting: Anesthesiology

## 2020-08-08 DIAGNOSIS — F419 Anxiety disorder, unspecified: Secondary | ICD-10-CM | POA: Insufficient documentation

## 2020-08-08 DIAGNOSIS — Z79899 Other long term (current) drug therapy: Secondary | ICD-10-CM | POA: Insufficient documentation

## 2020-08-08 DIAGNOSIS — M5116 Intervertebral disc disorders with radiculopathy, lumbar region: Secondary | ICD-10-CM | POA: Insufficient documentation

## 2020-08-08 DIAGNOSIS — F329 Major depressive disorder, single episode, unspecified: Secondary | ICD-10-CM | POA: Insufficient documentation

## 2020-08-08 DIAGNOSIS — Z794 Long term (current) use of insulin: Secondary | ICD-10-CM | POA: Insufficient documentation

## 2020-08-08 DIAGNOSIS — Z8541 Personal history of malignant neoplasm of cervix uteri: Secondary | ICD-10-CM | POA: Diagnosis not present

## 2020-08-08 DIAGNOSIS — Z87891 Personal history of nicotine dependence: Secondary | ICD-10-CM | POA: Insufficient documentation

## 2020-08-08 DIAGNOSIS — Z96652 Presence of left artificial knee joint: Secondary | ICD-10-CM | POA: Insufficient documentation

## 2020-08-08 DIAGNOSIS — M4316 Spondylolisthesis, lumbar region: Secondary | ICD-10-CM | POA: Diagnosis not present

## 2020-08-08 DIAGNOSIS — E119 Type 2 diabetes mellitus without complications: Secondary | ICD-10-CM | POA: Insufficient documentation

## 2020-08-08 DIAGNOSIS — Z419 Encounter for procedure for purposes other than remedying health state, unspecified: Secondary | ICD-10-CM

## 2020-08-08 DIAGNOSIS — I10 Essential (primary) hypertension: Secondary | ICD-10-CM | POA: Diagnosis not present

## 2020-08-08 DIAGNOSIS — M532X6 Spinal instabilities, lumbar region: Secondary | ICD-10-CM | POA: Diagnosis not present

## 2020-08-08 LAB — GLUCOSE, CAPILLARY
Glucose-Capillary: 154 mg/dL — ABNORMAL HIGH (ref 70–99)
Glucose-Capillary: 201 mg/dL — ABNORMAL HIGH (ref 70–99)
Glucose-Capillary: 281 mg/dL — ABNORMAL HIGH (ref 70–99)
Glucose-Capillary: 377 mg/dL — ABNORMAL HIGH (ref 70–99)
Glucose-Capillary: 396 mg/dL — ABNORMAL HIGH (ref 70–99)

## 2020-08-08 LAB — ABO/RH: ABO/RH(D): O POS

## 2020-08-08 LAB — SARS CORONAVIRUS 2 (TAT 6-24 HRS): SARS Coronavirus 2: NEGATIVE

## 2020-08-08 SURGERY — POSTERIOR LUMBAR FUSION 1 LEVEL
Anesthesia: General | Site: Back

## 2020-08-08 MED ORDER — CELECOXIB 200 MG PO CAPS
200.0000 mg | ORAL_CAPSULE | Freq: Once | ORAL | Status: AC
  Administered 2020-08-08: 200 mg via ORAL
  Filled 2020-08-08: qty 1

## 2020-08-08 MED ORDER — METOPROLOL TARTRATE 12.5 MG HALF TABLET
12.5000 mg | ORAL_TABLET | Freq: Two times a day (BID) | ORAL | Status: DC
  Administered 2020-08-08 – 2020-08-09 (×2): 12.5 mg via ORAL
  Filled 2020-08-08 (×3): qty 1

## 2020-08-08 MED ORDER — CHLORHEXIDINE GLUCONATE 0.12 % MT SOLN
15.0000 mL | Freq: Once | OROMUCOSAL | Status: AC
  Administered 2020-08-08: 15 mL via OROMUCOSAL
  Filled 2020-08-08: qty 15

## 2020-08-08 MED ORDER — LISINOPRIL 20 MG PO TABS
20.0000 mg | ORAL_TABLET | Freq: Every day | ORAL | Status: DC
  Administered 2020-08-09: 20 mg via ORAL
  Filled 2020-08-08: qty 1

## 2020-08-08 MED ORDER — CLONAZEPAM 0.5 MG PO TABS: 0.5000 mg | ORAL_TABLET | Freq: Two times a day (BID) | ORAL | Status: DC | PRN

## 2020-08-08 MED ORDER — PROPOFOL 10 MG/ML IV BOLUS
INTRAVENOUS | Status: DC | PRN
  Administered 2020-08-08: 130 mg via INTRAVENOUS

## 2020-08-08 MED ORDER — METHYLPREDNISOLONE 4 MG PO TABS: 4.0000 mg | ORAL_TABLET | ORAL | Status: DC

## 2020-08-08 MED ORDER — ACETAMINOPHEN 650 MG RE SUPP: 650.0000 mg | RECTAL | Status: DC | PRN

## 2020-08-08 MED ORDER — DEXAMETHASONE SODIUM PHOSPHATE 10 MG/ML IJ SOLN
INTRAMUSCULAR | Status: AC
  Filled 2020-08-08: qty 1

## 2020-08-08 MED ORDER — INSULIN ASPART 100 UNIT/ML ~~LOC~~ SOLN: 4.0000 [IU] | Freq: Three times a day (TID) | SUBCUTANEOUS | Status: DC

## 2020-08-08 MED ORDER — SODIUM CHLORIDE 0.9% FLUSH: 3.0000 mL | Freq: Two times a day (BID) | INTRAVENOUS | Status: DC

## 2020-08-08 MED ORDER — ROCURONIUM BROMIDE 10 MG/ML (PF) SYRINGE
PREFILLED_SYRINGE | INTRAVENOUS | Status: DC | PRN
  Administered 2020-08-08: 50 mg via INTRAVENOUS

## 2020-08-08 MED ORDER — PROPOFOL 10 MG/ML IV BOLUS
INTRAVENOUS | Status: AC
  Filled 2020-08-08: qty 20

## 2020-08-08 MED ORDER — LIDOCAINE-EPINEPHRINE 1 %-1:100000 IJ SOLN
INTRAMUSCULAR | Status: DC | PRN
  Administered 2020-08-08: 10 mL

## 2020-08-08 MED ORDER — INSULIN ASPART 100 UNIT/ML ~~LOC~~ SOLN
15.0000 [IU] | Freq: Three times a day (TID) | SUBCUTANEOUS | Status: DC
  Administered 2020-08-08: 15 [IU] via SUBCUTANEOUS
  Administered 2020-08-08: 7 [IU] via SUBCUTANEOUS
  Administered 2020-08-09 (×2): 15 [IU] via SUBCUTANEOUS

## 2020-08-08 MED ORDER — ACETAMINOPHEN 500 MG PO TABS
1000.0000 mg | ORAL_TABLET | Freq: Once | ORAL | Status: AC
  Administered 2020-08-08: 1000 mg via ORAL
  Filled 2020-08-08: qty 2

## 2020-08-08 MED ORDER — ORAL CARE MOUTH RINSE: 15.0000 mL | Freq: Once | OROMUCOSAL | Status: AC

## 2020-08-08 MED ORDER — OXYCODONE HCL 5 MG PO TABS
10.0000 mg | ORAL_TABLET | ORAL | Status: DC | PRN
  Administered 2020-08-08 – 2020-08-09 (×4): 10 mg via ORAL
  Filled 2020-08-08 (×3): qty 2

## 2020-08-08 MED ORDER — EPHEDRINE SULFATE-NACL 50-0.9 MG/10ML-% IV SOSY
PREFILLED_SYRINGE | INTRAVENOUS | Status: DC | PRN
  Administered 2020-08-08: 10 mg via INTRAVENOUS

## 2020-08-08 MED ORDER — PROMETHAZINE HCL 25 MG/ML IJ SOLN: 6.2500 mg | INTRAMUSCULAR | Status: DC | PRN

## 2020-08-08 MED ORDER — ONDANSETRON HCL 4 MG/2ML IJ SOLN: 4.0000 mg | Freq: Four times a day (QID) | INTRAMUSCULAR | Status: DC | PRN

## 2020-08-08 MED ORDER — GLYCOPYRROLATE PF 0.2 MG/ML IJ SOSY
PREFILLED_SYRINGE | INTRAMUSCULAR | Status: AC
  Filled 2020-08-08: qty 1

## 2020-08-08 MED ORDER — ALBUMIN HUMAN 5 % IV SOLN: INTRAVENOUS | Status: DC | PRN

## 2020-08-08 MED ORDER — LIDOCAINE 2% (20 MG/ML) 5 ML SYRINGE
INTRAMUSCULAR | Status: DC | PRN
  Administered 2020-08-08: 60 mg via INTRAVENOUS

## 2020-08-08 MED ORDER — THROMBIN 20000 UNITS EX SOLR: CUTANEOUS | Status: DC | PRN

## 2020-08-08 MED ORDER — MENTHOL 3 MG MT LOZG
1.0000 | LOZENGE | OROMUCOSAL | Status: DC | PRN
  Filled 2020-08-08: qty 9

## 2020-08-08 MED ORDER — CHLORHEXIDINE GLUCONATE CLOTH 2 % EX PADS: 6.0000 | MEDICATED_PAD | Freq: Once | CUTANEOUS | Status: DC

## 2020-08-08 MED ORDER — MIDAZOLAM HCL 2 MG/2ML IJ SOLN
INTRAMUSCULAR | Status: AC
  Filled 2020-08-08: qty 2

## 2020-08-08 MED ORDER — SODIUM CHLORIDE 0.9 % IV SOLN: 250.0000 mL | INTRAVENOUS | Status: DC

## 2020-08-08 MED ORDER — CYCLOBENZAPRINE HCL 10 MG PO TABS
10.0000 mg | ORAL_TABLET | Freq: Three times a day (TID) | ORAL | Status: DC
  Administered 2020-08-08 – 2020-08-09 (×3): 10 mg via ORAL
  Filled 2020-08-08 (×3): qty 1

## 2020-08-08 MED ORDER — LIDOCAINE-EPINEPHRINE 1 %-1:100000 IJ SOLN
INTRAMUSCULAR | Status: AC
  Filled 2020-08-08: qty 1

## 2020-08-08 MED ORDER — OXYCODONE HCL 5 MG PO TABS
10.0000 mg | ORAL_TABLET | Freq: Four times a day (QID) | ORAL | Status: DC | PRN
  Administered 2020-08-08: 10 mg via ORAL
  Filled 2020-08-08 (×2): qty 2

## 2020-08-08 MED ORDER — ROCURONIUM BROMIDE 10 MG/ML (PF) SYRINGE
PREFILLED_SYRINGE | INTRAVENOUS | Status: AC
  Filled 2020-08-08: qty 10

## 2020-08-08 MED ORDER — DEXAMETHASONE SODIUM PHOSPHATE 10 MG/ML IJ SOLN
INTRAMUSCULAR | Status: DC | PRN
  Administered 2020-08-08: 5 mg via INTRAVENOUS

## 2020-08-08 MED ORDER — GABAPENTIN 400 MG PO CAPS
400.0000 mg | ORAL_CAPSULE | Freq: Three times a day (TID) | ORAL | Status: DC
  Administered 2020-08-08 – 2020-08-09 (×3): 400 mg via ORAL
  Filled 2020-08-08 (×3): qty 1

## 2020-08-08 MED ORDER — THROMBIN 5000 UNITS EX SOLR
CUTANEOUS | Status: AC
  Filled 2020-08-08: qty 5000

## 2020-08-08 MED ORDER — FENTANYL CITRATE (PF) 250 MCG/5ML IJ SOLN
INTRAMUSCULAR | Status: DC | PRN
Start: 2020-08-08 — End: 2020-08-08
  Administered 2020-08-08 (×2): 50 ug via INTRAVENOUS
  Administered 2020-08-08: 100 ug via INTRAVENOUS
  Administered 2020-08-08: 50 ug via INTRAVENOUS

## 2020-08-08 MED ORDER — ALUM & MAG HYDROXIDE-SIMETH 200-200-20 MG/5ML PO SUSP: 30.0000 mL | Freq: Four times a day (QID) | ORAL | Status: DC | PRN

## 2020-08-08 MED ORDER — PHENOL 1.4 % MT LIQD: 1.0000 | OROMUCOSAL | Status: DC | PRN

## 2020-08-08 MED ORDER — DEXAMETHASONE SODIUM PHOSPHATE 10 MG/ML IJ SOLN
10.0000 mg | Freq: Once | INTRAMUSCULAR | Status: DC
  Filled 2020-08-08: qty 1

## 2020-08-08 MED ORDER — LISINOPRIL-HYDROCHLOROTHIAZIDE 20-25 MG PO TABS: 1.0000 | ORAL_TABLET | Freq: Every day | ORAL | Status: DC

## 2020-08-08 MED ORDER — VANCOMYCIN HCL IN DEXTROSE 1-5 GM/200ML-% IV SOLN
1000.0000 mg | INTRAVENOUS | Status: AC
  Administered 2020-08-08: 1000 mg via INTRAVENOUS
  Filled 2020-08-08: qty 200

## 2020-08-08 MED ORDER — THROMBIN 20000 UNITS EX SOLR
CUTANEOUS | Status: AC
  Filled 2020-08-08: qty 20000

## 2020-08-08 MED ORDER — INSULIN ASPART 100 UNIT/ML ~~LOC~~ SOLN
0.0000 [IU] | Freq: Three times a day (TID) | SUBCUTANEOUS | Status: DC
  Administered 2020-08-08: 11 [IU] via SUBCUTANEOUS

## 2020-08-08 MED ORDER — ONDANSETRON HCL 4 MG/2ML IJ SOLN
INTRAMUSCULAR | Status: DC | PRN
  Administered 2020-08-08: 4 mg via INTRAVENOUS

## 2020-08-08 MED ORDER — BUPIVACAINE LIPOSOME 1.3 % IJ SUSP
INTRAMUSCULAR | Status: DC | PRN
  Administered 2020-08-08: 20 mL

## 2020-08-08 MED ORDER — HYDROCHLOROTHIAZIDE 25 MG PO TABS
25.0000 mg | ORAL_TABLET | Freq: Every day | ORAL | Status: DC
  Administered 2020-08-09: 25 mg via ORAL
  Filled 2020-08-08: qty 1

## 2020-08-08 MED ORDER — ONDANSETRON HCL 4 MG/2ML IJ SOLN
INTRAMUSCULAR | Status: AC
  Filled 2020-08-08: qty 2

## 2020-08-08 MED ORDER — ADULT MULTIVITAMIN W/MINERALS CH
1.0000 | ORAL_TABLET | Freq: Every day | ORAL | Status: DC
  Administered 2020-08-08 – 2020-08-09 (×2): 1 via ORAL
  Filled 2020-08-08 (×2): qty 1

## 2020-08-08 MED ORDER — SUGAMMADEX SODIUM 200 MG/2ML IV SOLN
INTRAVENOUS | Status: DC | PRN
  Administered 2020-08-08: 200 mg via INTRAVENOUS

## 2020-08-08 MED ORDER — FENTANYL CITRATE (PF) 250 MCG/5ML IJ SOLN
INTRAMUSCULAR | Status: AC
  Filled 2020-08-08: qty 5

## 2020-08-08 MED ORDER — SODIUM CHLORIDE 0.9 % IV SOLN: INTRAVENOUS | Status: DC | PRN

## 2020-08-08 MED ORDER — LACTATED RINGERS IV SOLN: INTRAVENOUS | Status: DC

## 2020-08-08 MED ORDER — HYDROMORPHONE HCL 1 MG/ML IJ SOLN: 0.5000 mg | INTRAMUSCULAR | Status: DC | PRN

## 2020-08-08 MED ORDER — VANCOMYCIN HCL IN DEXTROSE 1-5 GM/200ML-% IV SOLN
1000.0000 mg | Freq: Once | INTRAVENOUS | Status: AC
  Administered 2020-08-08: 1000 mg via INTRAVENOUS
  Filled 2020-08-08: qty 200

## 2020-08-08 MED ORDER — PANTOPRAZOLE SODIUM 40 MG IV SOLR: 40.0000 mg | Freq: Every day | INTRAVENOUS | Status: DC

## 2020-08-08 MED ORDER — ONDANSETRON HCL 4 MG PO TABS: 4.0000 mg | ORAL_TABLET | Freq: Four times a day (QID) | ORAL | Status: DC | PRN

## 2020-08-08 MED ORDER — CYCLOBENZAPRINE HCL 10 MG PO TABS: 10.0000 mg | ORAL_TABLET | Freq: Three times a day (TID) | ORAL | Status: DC | PRN

## 2020-08-08 MED ORDER — PANTOPRAZOLE SODIUM 40 MG PO TBEC
40.0000 mg | DELAYED_RELEASE_TABLET | Freq: Every day | ORAL | Status: DC
  Administered 2020-08-08 – 2020-08-09 (×2): 40 mg via ORAL
  Filled 2020-08-08: qty 1

## 2020-08-08 MED ORDER — HYDROXYZINE HCL 25 MG PO TABS
25.0000 mg | ORAL_TABLET | Freq: Three times a day (TID) | ORAL | Status: DC
  Administered 2020-08-08 – 2020-08-09 (×3): 25 mg via ORAL
  Filled 2020-08-08 (×3): qty 1

## 2020-08-08 MED ORDER — ACETAMINOPHEN 325 MG PO TABS
650.0000 mg | ORAL_TABLET | ORAL | Status: DC | PRN
  Administered 2020-08-08 – 2020-08-09 (×2): 650 mg via ORAL
  Filled 2020-08-08 (×2): qty 2

## 2020-08-08 MED ORDER — PHENYLEPHRINE HCL-NACL 10-0.9 MG/250ML-% IV SOLN
INTRAVENOUS | Status: DC | PRN
  Administered 2020-08-08: 25 ug/min via INTRAVENOUS

## 2020-08-08 MED ORDER — MIDAZOLAM HCL 2 MG/2ML IJ SOLN
INTRAMUSCULAR | Status: DC | PRN
  Administered 2020-08-08: 2 mg via INTRAVENOUS

## 2020-08-08 MED ORDER — DULAGLUTIDE 3 MG/0.5ML ~~LOC~~ SOAJ: 3.0000 mg | SUBCUTANEOUS | Status: DC

## 2020-08-08 MED ORDER — BUPIVACAINE LIPOSOME 1.3 % IJ SUSP
20.0000 mL | Freq: Once | INTRAMUSCULAR | Status: DC
  Filled 2020-08-08: qty 20

## 2020-08-08 MED ORDER — ESCITALOPRAM OXALATE 10 MG PO TABS
10.0000 mg | ORAL_TABLET | Freq: Every day | ORAL | Status: DC
  Administered 2020-08-09: 10 mg via ORAL
  Filled 2020-08-08: qty 1

## 2020-08-08 MED ORDER — SODIUM CHLORIDE 0.9% FLUSH: 3.0000 mL | INTRAVENOUS | Status: DC | PRN

## 2020-08-08 MED ORDER — 0.9 % SODIUM CHLORIDE (POUR BTL) OPTIME
TOPICAL | Status: DC | PRN
  Administered 2020-08-08 (×2): 1000 mL

## 2020-08-08 MED ORDER — ATORVASTATIN CALCIUM 40 MG PO TABS
40.0000 mg | ORAL_TABLET | Freq: Every day | ORAL | Status: DC
  Administered 2020-08-08: 40 mg via ORAL
  Filled 2020-08-08: qty 1

## 2020-08-08 MED ORDER — FENTANYL CITRATE (PF) 100 MCG/2ML IJ SOLN
25.0000 ug | INTRAMUSCULAR | Status: DC | PRN
  Administered 2020-08-08: 25 ug via INTRAVENOUS

## 2020-08-08 MED ORDER — LIDOCAINE 2% (20 MG/ML) 5 ML SYRINGE
INTRAMUSCULAR | Status: AC
  Filled 2020-08-08: qty 5

## 2020-08-08 MED ORDER — FENTANYL CITRATE (PF) 100 MCG/2ML IJ SOLN
INTRAMUSCULAR | Status: AC
Start: 2020-08-08 — End: 2020-08-09
  Filled 2020-08-08: qty 2

## 2020-08-08 MED ORDER — INSULIN GLARGINE 100 UNIT/ML ~~LOC~~ SOLN
30.0000 [IU] | Freq: Every day | SUBCUTANEOUS | Status: DC
  Administered 2020-08-08 – 2020-08-09 (×2): 30 [IU] via SUBCUTANEOUS
  Filled 2020-08-08 (×2): qty 0.3

## 2020-08-08 SURGICAL SUPPLY — 79 items
BAG DECANTER FOR FLEXI CONT (MISCELLANEOUS) ×3 IMPLANT
BASKET BONE COLLECTION (BASKET) ×3 IMPLANT
BENZOIN TINCTURE PRP APPL 2/3 (GAUZE/BANDAGES/DRESSINGS) ×3 IMPLANT
BIT DRILL 5.0/4.0 (BIT) ×1 IMPLANT
BLADE CLIPPER SURG (BLADE) IMPLANT
BLADE SURG 11 STRL SS (BLADE) ×3 IMPLANT
BONE VIVIGEN FORMABLE 5.4CC (Bone Implant) ×3 IMPLANT
BUR CUTTER 7.0 ROUND (BURR) ×3 IMPLANT
BUR MATCHSTICK NEURO 3.0 LAGG (BURR) ×3 IMPLANT
CANISTER SUCT 3000ML PPV (MISCELLANEOUS) ×3 IMPLANT
CAP LOCKING (Cap) ×8 IMPLANT
CAP LOCKING 5.5 CREO (Cap) ×4 IMPLANT
CARTRIDGE OIL MAESTRO DRILL (MISCELLANEOUS) ×1 IMPLANT
CLOSURE WOUND 1/2 X4 (GAUZE/BANDAGES/DRESSINGS) ×1
CNTNR URN SCR LID CUP LEK RST (MISCELLANEOUS) ×1 IMPLANT
CONT SPEC 4OZ STRL OR WHT (MISCELLANEOUS) ×2
COVER BACK TABLE 60X90IN (DRAPES) ×3 IMPLANT
COVER WAND RF STERILE (DRAPES) IMPLANT
DECANTER SPIKE VIAL GLASS SM (MISCELLANEOUS) ×3 IMPLANT
DERMABOND ADVANCED (GAUZE/BANDAGES/DRESSINGS) ×2
DERMABOND ADVANCED .7 DNX12 (GAUZE/BANDAGES/DRESSINGS) ×1 IMPLANT
DIFFUSER DRILL AIR PNEUMATIC (MISCELLANEOUS) ×3 IMPLANT
DRAPE C-ARM 42X72 X-RAY (DRAPES) ×6 IMPLANT
DRAPE C-ARMOR (DRAPES) IMPLANT
DRAPE HALF SHEET 40X57 (DRAPES) IMPLANT
DRAPE LAPAROTOMY 100X72X124 (DRAPES) ×3 IMPLANT
DRAPE SURG 17X23 STRL (DRAPES) ×3 IMPLANT
DRILL 5.0/4.0 (BIT) ×3
DRSG OPSITE 4X5.5 SM (GAUZE/BANDAGES/DRESSINGS) IMPLANT
DRSG OPSITE POSTOP 4X6 (GAUZE/BANDAGES/DRESSINGS) ×3 IMPLANT
DURAPREP 26ML APPLICATOR (WOUND CARE) ×3 IMPLANT
ELECT REM PT RETURN 9FT ADLT (ELECTROSURGICAL) ×3
ELECTRODE REM PT RTRN 9FT ADLT (ELECTROSURGICAL) ×1 IMPLANT
EVACUATOR 3/16  PVC DRAIN (DRAIN)
EVACUATOR 3/16 PVC DRAIN (DRAIN) IMPLANT
GAUZE 4X4 16PLY RFD (DISPOSABLE) IMPLANT
GAUZE SPONGE 4X4 12PLY STRL (GAUZE/BANDAGES/DRESSINGS) ×3 IMPLANT
GLOVE BIO SURGEON STRL SZ7 (GLOVE) ×6 IMPLANT
GLOVE BIO SURGEON STRL SZ8 (GLOVE) ×6 IMPLANT
GLOVE BIOGEL PI IND STRL 7.0 (GLOVE) ×2 IMPLANT
GLOVE BIOGEL PI IND STRL 7.5 (GLOVE) ×2 IMPLANT
GLOVE BIOGEL PI IND STRL 8 (GLOVE) ×4 IMPLANT
GLOVE BIOGEL PI INDICATOR 7.0 (GLOVE) ×4
GLOVE BIOGEL PI INDICATOR 7.5 (GLOVE) ×4
GLOVE BIOGEL PI INDICATOR 8 (GLOVE) ×8
GLOVE ECLIPSE 7.5 STRL STRAW (GLOVE) ×12 IMPLANT
GLOVE EXAM NITRILE XL STR (GLOVE) IMPLANT
GLOVE INDICATOR 8.5 STRL (GLOVE) ×6 IMPLANT
GOWN STRL REUS W/ TWL LRG LVL3 (GOWN DISPOSABLE) ×2 IMPLANT
GOWN STRL REUS W/ TWL XL LVL3 (GOWN DISPOSABLE) ×2 IMPLANT
GOWN STRL REUS W/TWL 2XL LVL3 (GOWN DISPOSABLE) ×6 IMPLANT
GOWN STRL REUS W/TWL LRG LVL3 (GOWN DISPOSABLE) ×4
GOWN STRL REUS W/TWL XL LVL3 (GOWN DISPOSABLE) ×4
HEMOSTAT POWDER KIT SURGIFOAM (HEMOSTASIS) IMPLANT
KIT BASIN OR (CUSTOM PROCEDURE TRAY) ×3 IMPLANT
KIT TURNOVER KIT B (KITS) ×3 IMPLANT
MILL MEDIUM DISP (BLADE) ×3 IMPLANT
NEEDLE HYPO 21X1.5 SAFETY (NEEDLE) ×3 IMPLANT
NEEDLE HYPO 25X1 1.5 SAFETY (NEEDLE) ×3 IMPLANT
NS IRRIG 1000ML POUR BTL (IV SOLUTION) ×6 IMPLANT
OIL CARTRIDGE MAESTRO DRILL (MISCELLANEOUS) ×3
PACK LAMINECTOMY NEURO (CUSTOM PROCEDURE TRAY) ×3 IMPLANT
PAD ARMBOARD 7.5X6 YLW CONV (MISCELLANEOUS) ×9 IMPLANT
ROD 40MM SPINAL (Rod) ×6 IMPLANT
SHAFT CREO 30MM (Neuro Prosthesis/Implant) ×12 IMPLANT
SPACER SUSTAIN RT 12 8D 9X26 (Spacer) ×6 IMPLANT
SPONGE LAP 4X18 RFD (DISPOSABLE) IMPLANT
SPONGE SURGIFOAM ABS GEL 100 (HEMOSTASIS) ×3 IMPLANT
STRIP CLOSURE SKIN 1/2X4 (GAUZE/BANDAGES/DRESSINGS) ×2 IMPLANT
SUT VIC AB 0 CT1 18XCR BRD8 (SUTURE) ×2 IMPLANT
SUT VIC AB 0 CT1 8-18 (SUTURE) ×4
SUT VIC AB 2-0 CT1 18 (SUTURE) ×3 IMPLANT
SUT VIC AB 4-0 PS2 27 (SUTURE) ×3 IMPLANT
SYR 20ML LL LF (SYRINGE) ×3 IMPLANT
TOWEL GREEN STERILE (TOWEL DISPOSABLE) ×3 IMPLANT
TOWEL GREEN STERILE FF (TOWEL DISPOSABLE) ×3 IMPLANT
TRAY FOLEY MTR SLVR 16FR STAT (SET/KITS/TRAYS/PACK) ×3 IMPLANT
TULIP CREP AMP 5.5MM (Orthopedic Implant) ×12 IMPLANT
WATER STERILE IRR 1000ML POUR (IV SOLUTION) ×3 IMPLANT

## 2020-08-08 NOTE — Transfer of Care (Signed)
Immediate Anesthesia Transfer of Care Note  Patient: Tiffany Robles  Procedure(s) Performed: Posterior Lumbar Interbody Fusion Lumbar four-five (N/A Back)  Patient Location: PACU  Anesthesia Type:General  Level of Consciousness: drowsy and patient cooperative  Airway & Oxygen Therapy: Patient Spontanous Breathing and Patient connected to face mask oxygen  Post-op Assessment: Report given to RN and Post -op Vital signs reviewed and stable  Post vital signs: Reviewed and stable  Last Vitals:  Vitals Value Taken Time  BP 97/44 08/08/20 1127  Temp    Pulse 72 08/08/20 1128  Resp 13 08/08/20 1128  SpO2 99 % 08/08/20 1128  Vitals shown include unvalidated device data.  Last Pain:  Vitals:   08/08/20 0659  TempSrc: Temporal  PainSc:       Patients Stated Pain Goal: 5 (50/35/46 5681)  Complications: No complications documented.

## 2020-08-08 NOTE — H&P (Signed)
Tiffany Robles is an 64 y.o. female.   Chief Complaint: Back and right leg pain HPI: 64 year old female with progressive worsening back and primarily right leg pain previously and had a laminotomy on the left has no leg pain currently.  Work-up with MRI scan and subsequent CT myelography has shown a dynamic listhesis at L4-5 with impingement of the right L4 and L5 nerve root.  Due to patient's progressive clinical syndrome imaging findings and failed conservative treatment I recommended decompression stabilization procedure at L4-5.  I extensively went over the risks and benefits of that operation with her as well as perioperative course expectations of outcome and alternatives of surgery and she understood and agreed to proceed forward.  Past Medical History:  Diagnosis Date  . Anxiety   . Arthritis   . Asthma    childhood hx.  . Cancer ([place])    30 yrs, ago, cervical  . Depression   . Diabetes mellitus without complication ([place])    type 2   . Family history of adverse reaction to anesthesia    mother allergic to Anesthesia drug- had a very bad headache  . Hypertension   . Pneumonia   . Primary localized osteoarthritis of left knee   . Syncope and collapse    first occurrence 40 years ago , last occurrence 10 years ago , saw cardio for eval , underwent echo and per patient results were normal and was told to f/u with cardio if syncope reoccurs, patient reports no reoccurence of syncope since that time .    Past Surgical History:  Procedure Laterality Date  . ABDOMINAL HYSTERECTOMY    . BACK SURGERY    . DILATION AND CURETTAGE OF UTERUS    . JOINT REPLACEMENT Left 10/26/020   Knee  . LUMBAR LAMINECTOMY/DECOMPRESSION MICRODISCECTOMY Left 10/31/2019   Procedure: Laminectomy and Foraminotomy - left - Lumbar four-Lumbar five;  Surgeon: Kary Kos, MD;  Location: [facility];  Service: Neurosurgery;  Laterality: Left;  . TOTAL KNEE ARTHROPLASTY Left 09/26/2019   Procedure: TOTAL KNEE  ARTHROPLASTY;  Surgeon: Elsie Saas, MD;  Location: [facility];  Service: Orthopedics;  Laterality: Left;    Family History  Problem Relation Age of Onset  . Diabetes Mother   . Hypertension Mother   . Diabetes Father   . Hypertension Father   . Diabetes Brother   . Hypertension Brother   . Breast cancer Neg Hx    Social History:  reports that she has quit smoking. Her smoking use included cigarettes. She has a 60.00 pack-year smoking history. She has never used smokeless tobacco. She reports current alcohol use. She reports that she does not use drugs.  Allergies:  Allergies  Allergen Reactions  . Penicillins Swelling and Other (See Comments)    Facial swelling Did it involve swelling of the face/tongue/throat, SOB, or low BP? Yes Did it involve sudden or severe rash/hives, skin peeling, or any reaction on the inside of your mouth or nose? No Did you need to seek medical attention at a hospital or doctor's office?    #  #  YES  #  #  When did it last happen?10+ years If all above answers are "NO", may proceed with cephalosporin use.   . Augmentin [Amoxicillin-Pot Clavulanate] Nausea And Vomiting  . Codeine Nausea And Vomiting  . Ivp Dye [Iodinated Diagnostic Agents] Hives and Itching    Just contrast dye , no reactions to other -dines   . Keflex [Cephalexin] Nausea And Vomiting  Medications Prior to Admission  Medication Sig Dispense Refill  . atorvastatin (LIPITOR) 40 MG tablet Take 40 mg by mouth at bedtime.     . clonazePAM (KLONOPIN) 0.5 MG tablet Take 0.5 mg by mouth 2 (two) times daily as needed (anxiety).     . cyclobenzaprine (FLEXERIL) 10 MG tablet Take 10 mg by mouth 3 (three) times daily.    Marland Kitchen escitalopram (LEXAPRO) 10 MG tablet Take 10 mg by mouth daily.    Marland Kitchen gabapentin (NEURONTIN) 400 MG capsule Take 400 mg by mouth 3 (three) times daily.    . hydrOXYzine (ATARAX/VISTARIL) 25 MG tablet Take 25 mg by mouth 3 (three) times daily.    . IBU 800 MG tablet Take  800 mg by mouth 3 (three) times daily as needed for pain.    Marland Kitchen LANTUS SOLOSTAR 100 UNIT/ML Solostar Pen Inject 30 Units into the skin daily.    Marland Kitchen lisinopril-hydrochlorothiazide (ZESTORETIC) 20-25 MG tablet Take 1 tablet by mouth daily.    . methylPREDNISolone (MEDROL) 4 MG tablet Take 4 mg by mouth See admin instructions. Take these number of tablets on consecutive days 6-5-4-3-2-1    . metoprolol tartrate (LOPRESSOR) 25 MG tablet Take 0.5 tablets (12.5 mg total) by mouth 2 (two) times daily. 60 tablet 0  . Multiple Vitamin (MULTIVITAMIN WITH MINERALS) TABS tablet Take 1 tablet by mouth in the morning and at bedtime. Complete Multivitamin for Women 50+    . NOVOLOG FLEXPEN 100 UNIT/ML FlexPen Inject 15 Units into the skin 4 (four) times daily -  before meals and at bedtime.    Marland Kitchen oxycodone (OXY-IR) 5 MG capsule Take 10 mg by mouth every 6 (six) hours as needed for pain.    . TRULICITY 3 ZM/6.2HU SOPN Inject 3 mg into the skin every Saturday.    Marland Kitchen ibuprofen (ADVIL) 600 MG tablet Take 1 tablet (600 mg total) by mouth 3 (three) times daily as needed for mild pain. (Patient not taking: Reported on 07/30/2020) 30 tablet 0    Results for orders placed or performed during the hospital encounter of 08/08/20 (from the past 48 hour(s))  Glucose, capillary     Status: Abnormal   Collection Time: 08/08/20  7:00 AM  Result Value Ref Range   Glucose-Capillary 154 (H) 70 - 99 mg/dL    Comment: Glucose reference range applies only to samples taken after fasting for at least 8 hours.   No results found.  Review of Systems  Musculoskeletal: Positive for back pain.  Neurological: Positive for numbness.    Blood pressure (!) 150/65, pulse 77, temperature 97.8 F (36.6 C), temperature source Temporal, resp. rate 18, height 5\' 3"  (1.6 m), weight 88.1 kg, SpO2 99 %. Physical Exam HENT:     Head: Normocephalic.     Right Ear: Tympanic membrane normal.     Nose: Nose normal.     Mouth/Throat:     Mouth:  Mucous membranes are moist.  Eyes:     Pupils: Pupils are equal, round, and reactive to light.  Cardiovascular:     Rate and Rhythm: Normal rate.     Pulses: Normal pulses.  Pulmonary:     Effort: Pulmonary effort is normal.  Abdominal:     General: Abdomen is flat.  Musculoskeletal:     Cervical back: Normal range of motion.  Neurological:     Mental Status: She is alert.     Comments: Strength is 5 out of 5 iliopsoas, quads, hamstrings, gastrocs, into tibialis, and  EHL.      Assessment/Plan 64 year old presents for decompression stabilization procedure L4-5.  Elaina Hoops, MD 08/08/2020, 8:02 AM

## 2020-08-08 NOTE — Anesthesia Procedure Notes (Signed)
Procedure Name: Intubation Date/Time: 08/08/2020 8:45 AM Performed by: Kathryne Hitch, CRNA Pre-anesthesia Checklist: Patient identified, Emergency Drugs available, Suction available and Patient being monitored Patient Re-evaluated:Patient Re-evaluated prior to induction Oxygen Delivery Method: Circle system utilized Preoxygenation: Pre-oxygenation with 100% oxygen Induction Type: IV induction Ventilation: Mask ventilation without difficulty Laryngoscope Size: Miller and 2 Grade View: Grade II Tube type: Oral Tube size: 7.0 mm Number of attempts: 1 Airway Equipment and Method: Stylet and Oral airway Placement Confirmation: ETT inserted through vocal cords under direct vision,  positive ETCO2 and breath sounds checked- equal and bilateral Secured at: 21 cm Tube secured with: Tape Dental Injury: Teeth and Oropharynx as per pre-operative assessment

## 2020-08-08 NOTE — Op Note (Signed)
Preoperative diagnosis: Grade 1 spondylolisthesis with instability L4-5 severe spinal stenosis L4-5 right-sided L4-L5 radiculopathy  Postoperative diagnosis: Same  Procedure: #1 redo decompressive lumbar laminectomy L4-5 with complete medial facetectomies radical foraminotomies of the L4 and L5 nerve roots bilaterally  2.  Posterior lumbar interbody fusion L4-5 utilizing globus insert and rotate titanium cages packed with locally harvested autograft mixed with vivigen  3.  Cortical screw fixation L4-5 utilizing the globus Creo modular cortical screw set  Surgeon: Dominica Severin Misty Foutz  Assistant: Nash Shearer  Anesthesia: General  EBL: Minimal  HPI: 64 year old female previously undergone laminectomy discectomy on the left at L4-5 had complete resolution of her left-sided radiculopathy however several months ago started spearing seeing right-sided L4-L5 radicular pain and work-up revealed spondylolisthesis herniated disc at L4-5 and a dynamic listhesis on flexion-extension films.  Due to patient's failed conservative treatment progression of clinical syndrome and imaging findings I recommended decompression stabilization procedure at L4-5.  I extensively reviewed the risks and benefits of the operation with her as well as perioperative course expectations of outcome and alternatives to surgery and she understood and agreed to proceed forward.  Operative procedure: Patient brought into the OR was due to general anesthesia positioned prone Wilson frame her back was prepped and draped in routine sterile fashion roll incision was infiltrated and extended slightly cephalad caudally the scar tissue was dissected free and subperiosteal dissection was carried out bilaterally at L3-L4 and L5.  Intraoperative x-ray identified the L4 pedicle so then the facets were drilled down the spinous process was removed central decompression was begun and working initially on the right side identified normal anatomy then  identified the L4 pedicle completely unroofed the L4 foramen dissected the facet and the scar tissue off of the facet joint at L4-5 on the left working through the scar tissue removed complete and performed a complete medial facetectomy under biting the supra articulating facet gaining access lateral margin of the disc base I was then able to tease the L5 nerve root off of the L5 pedicle and unroofed the foramen as well.  Then on the other side in a similar fashion performed complete and radical foraminotomies the L4 and L5 nerve root with complete medial facetectomy.  Again underbite and the superior tickly facet to gain access lateral margin disc base.  Disc base was then incised cleaned out bilaterally utilizing sequential distraction with a 12 distractor in place I selected a 9-12 insert and rotate titanium cage and under fluoroscopy confirmed the appropriate size and after adequate discectomy and endplate preparation inserted the left-sided cage packed extensive mount of autograft centrally and inserted the right-sided cage pathology was primarily ligamentous hypertrophy and a large spur on the right as well as lateral disc herniation that was removed.  After all the interbody work been done under fluoroscopy cortical screws entry points were drilled tapped probed and 6050 by 70mm cortical screws were placed bilaterally.  Postop fluoroscopy confirmed position of all implants.  I then assembled the heads advanced the screws placed 40 mm rods tightened down L5 compressed L4 against L5.  Inspected all the foramina to confirm patency and no migration of graft material lay Gelfoam on top of the dura then the muscle fascia approximate layers with Vicryl skin wound was copiously irrigated Exparel was injected in the fascia.  The subcutaneous tissue was then closed with 2-0 Vicryl in a running 4 subcuticular in the skin.  Wound was dressed patient recovery in stable condition at the end the case all  needle count sponge  counts were correct.

## 2020-08-08 NOTE — Progress Notes (Signed)
Orthopedic Tech Progress Note Patient Details:  Tiffany Robles 1956-01-19 [id number] RN said patient has brace. Patient ID: [name], female   DOB: 21-Apr-1956, 64 y.o.   MRN: [id number]   [name] 08/08/2020, 2:23 PM

## 2020-08-08 NOTE — Anesthesia Postprocedure Evaluation (Signed)
Anesthesia Post Note  Patient: Tiffany Robles  Procedure(s) Performed: Posterior Lumbar Interbody Fusion Lumbar four-five (N/A Back)     Patient location during evaluation: PACU Anesthesia Type: General Level of consciousness: sedated Pain management: pain level controlled Vital Signs Assessment: post-procedure vital signs reviewed and stable Respiratory status: spontaneous breathing and respiratory function stable Cardiovascular status: stable Postop Assessment: no apparent nausea or vomiting Anesthetic complications: no   No complications documented.  Last Vitals:  Vitals:   08/08/20 1250 08/08/20 1310  BP: (!) 153/66 (!) 144/89  Pulse: 85 80  Resp:  18  Temp:  (!) 36.4 C  SpO2: 94% 98%    Last Pain:  Vitals:   08/08/20 1310  TempSrc: Oral  PainSc:                  [place]

## 2020-08-09 DIAGNOSIS — M4316 Spondylolisthesis, lumbar region: Secondary | ICD-10-CM | POA: Diagnosis not present

## 2020-08-09 LAB — GLUCOSE, CAPILLARY
Glucose-Capillary: 192 mg/dL — ABNORMAL HIGH (ref 70–99)
Glucose-Capillary: 164 mg/dL — ABNORMAL HIGH (ref 70–99)

## 2020-08-09 MED ORDER — CYCLOBENZAPRINE HCL 10 MG PO TABS: 10.0000 mg | ORAL_TABLET | Freq: Three times a day (TID) | ORAL | 0 refills | Status: DC | PRN

## 2020-08-09 MED ORDER — OXYCODONE-ACETAMINOPHEN 5-325 MG PO TABS: 1.0000 | ORAL_TABLET | ORAL | 0 refills | Status: AC | PRN

## 2020-08-09 NOTE — Discharge Summary (Signed)
Physician Discharge Summary  Patient ID: Tiffany Robles MRN: [id number] DOB/AGE: Jul 26, 1956 64 y.o. Estimated body mass index is 34.42 kg/m as calculated from the following:   Height as of this encounter: 5\' 3"  (1.6 m).   Weight as of this encounter: 88.1 kg.   Admit date: 08/08/2020 Discharge date: 08/09/2020  Admission Diagnoses: Grade 1 spondylolisthesis L4-5  Discharge Diagnoses: Same Active Problems:   Spondylolisthesis at L4-L5 level   Discharged Condition: good  Hospital Course: Patient is admitted to hospital underwent decompressive laminectomy and interbody fusion at L4-5.  Postoperatively patient did very well recovering the floor on the floor was ambulating and voiding spontaneously tolerating regular diet stable for discharge home.  Consults: Significant Diagnostic Studies: Treatments: L4-5 left Discharge Exam: Blood pressure (!) 129/54, pulse 93, temperature 99.1 F (37.3 C), temperature source Oral, resp. rate 16, height 5\' 3"  (1.6 m), weight 88.1 kg, SpO2 95 %. Strength 5-5 wound clean dry and intact  Disposition: Home  Discharge Instructions     Remove dressing in 72 hours   Complete by: As directed    Call MD for:  difficulty breathing, headache or visual disturbances   Complete by: As directed    Call MD for:  hives   Complete by: As directed    Call MD for:  persistant dizziness or light-headedness   Complete by: As directed    Call MD for:  persistant nausea and vomiting   Complete by: As directed    Call MD for:  redness, tenderness, or signs of infection (pain, swelling, redness, odor or green/yellow discharge around incision site)   Complete by: As directed    Call MD for:  severe uncontrolled pain   Complete by: As directed    Call MD for:  temperature >100.4   Complete by: As directed    Diet - low sodium heart healthy   Complete by: As directed    Driving Restrictions   Complete by: As directed    No driving for 2 weeks, no riding in the  car for 1 week   Increase activity slowly   Complete by: As directed    Lifting restrictions   Complete by: As directed    No lifting more than 8 lbs     Allergies as of 08/09/2020      Reactions   Penicillins Swelling, Other (See Comments)   Facial swelling Did it involve swelling of the face/tongue/throat, SOB, or low BP? Yes Did it involve sudden or severe rash/hives, skin peeling, or any reaction on the inside of your mouth or nose? No Did you need to seek medical attention at a hospital or doctor's office?    #  #  YES  #  #  When did it last happen?10+ years If all above answers are "NO", may proceed with cephalosporin use.   Augmentin [amoxicillin-pot Clavulanate] Nausea And Vomiting   Codeine Nausea And Vomiting   Ivp Dye [iodinated Diagnostic Agents] Hives, Itching   Just contrast dye , no reactions to other -dines    Keflex [cephalexin] Nausea And Vomiting      Medication List    STOP taking these medications   IBU 800 MG tablet Generic drug: ibuprofen   ibuprofen 600 MG tablet Commonly known as: ADVIL     TAKE these medications   atorvastatin 40 MG tablet Commonly known as: LIPITOR Take 40 mg by mouth at bedtime.   clonazePAM 0.5 MG tablet Commonly known as: KLONOPIN Take 0.5 mg by mouth  2 (two) times daily as needed (anxiety).   cyclobenzaprine 10 MG tablet Commonly known as: FLEXERIL Take 10 mg by mouth 3 (three) times daily. What changed: Another medication with the same name was added. Make sure you understand how and when to take each.   cyclobenzaprine 10 MG tablet Commonly known as: FLEXERIL Take 1 tablet (10 mg total) by mouth 3 (three) times daily as needed for muscle spasms. What changed: You were already taking a medication with the same name, and this prescription was added. Make sure you understand how and when to take each.   escitalopram 10 MG tablet Commonly known as: LEXAPRO Take 10 mg by mouth daily.   gabapentin 400 MG  capsule Commonly known as: NEURONTIN Take 400 mg by mouth 3 (three) times daily.   hydrOXYzine 25 MG tablet Commonly known as: ATARAX/VISTARIL Take 25 mg by mouth 3 (three) times daily.   Lantus SoloStar 100 UNIT/ML Solostar Pen Generic drug: insulin glargine Inject 30 Units into the skin daily.   lisinopril-hydrochlorothiazide 20-25 MG tablet Commonly known as: ZESTORETIC Take 1 tablet by mouth daily.   methylPREDNISolone 4 MG tablet Commonly known as: MEDROL Take 4 mg by mouth See admin instructions. Take these number of tablets on consecutive days 6-5-4-3-2-1   metoprolol tartrate 25 MG tablet Commonly known as: LOPRESSOR Take 0.5 tablets (12.5 mg total) by mouth 2 (two) times daily.   multivitamin with minerals Tabs tablet Take 1 tablet by mouth in the morning and at bedtime. Complete Multivitamin for Women 50+   NovoLOG FlexPen 100 UNIT/ML FlexPen Generic drug: insulin aspart Inject 15 Units into the skin 4 (four) times daily -  before meals and at bedtime.   oxycodone 5 MG capsule Commonly known as: OXY-IR Take 10 mg by mouth every 6 (six) hours as needed for pain.   oxyCODONE-acetaminophen 5-325 MG tablet Commonly known as: Percocet Take 1 tablet by mouth every 4 (four) hours as needed for severe pain.   Trulicity 3 [facility]/7.9GX Sopn Generic drug: Dulaglutide Inject 3 mg into the skin every Saturday.        Signed: Elaina Hoops 08/09/2020, 8:10 AM

## 2020-08-09 NOTE — Plan of Care (Signed)
Patient alert and oriented, mae's well, voiding adequate amount of urine, swallowing without difficulty, no c/o pain at time of discharge. Patient discharged home with family. Script and discharged instructions given to patient. Patient and family stated understanding of instructions given. Patient has an appointment with Dr. Cram 

## 2020-08-09 NOTE — Evaluation (Signed)
Occupational Therapy Evaluation Patient Details Name: Tiffany Robles MRN: [id number] DOB: 15-Aug-1956 Today's Date: 08/09/2020    History of Present Illness Pt is a 64 y/o female with PMH of arthritis, anxiety, DM 2, cervical CA, PNA, HTN, L TKA, progressive worsening back and R LE pain. S/P PLIF L 4-5.    Clinical Impression   PTA patient independent with ADLs, mobility. Admitted for above and limited by problem list below, including back pain and precautions. Patient educated on back precautions, ADL compensatory techniques, AE/DME, recommendations, safety, and energy conservation.  Patient demonstrating ability to complete transfers and in room mobility with supervision, UB ADls with setup assist and LB ADLs with min assist to supervision (educated on long sponge use for bathing, and pt typically uses reacher for LB dressing/slip on shoes).  She will have support from her boyfriend who can assist with ADLs as needed.  Agreeable to 3:1 commode for use as shower chair and toilet elevation as needed.  Mild shaking with mobility from bathroom back to recliner, pt reaching out for UE support but no losses of balance noted- Reviewed energy conservation and activity progression.  Based on performance today, no further OT needs have been identified and OT will sign off. Thank you for the referral!     Follow Up Recommendations  No OT follow up;Supervision - Intermittent    Equipment Recommendations  3 in 1 bedside commode    Recommendations for Other Services       Precautions / Restrictions Precautions Precautions: Fall;Back Precaution Booklet Issued: Yes (comment) Precaution Comments: reviewed with patient, able to recall 3/3 without cueing Required Braces or Orthoses: Spinal Brace Spinal Brace: Lumbar corset;Applied in sitting position (on upon entry ) Restrictions Weight Bearing Restrictions: No      Mobility Bed Mobility               General bed mobility comments: OOB in  recliner upon entry   Transfers Overall transfer level: Needs assistance Equipment used: None Transfers: Sit to/from Stand Sit to Stand: Supervision         General transfer comment: for safety, good posture and technique    Balance Overall balance assessment: Mild deficits observed, not formally tested                                         ADL either performed or assessed with clinical judgement   ADL Overall ADL's : Needs assistance/impaired     Grooming: Supervision/safety;Standing   Upper Body Bathing: Set up;Sitting   Lower Body Bathing: Minimal assistance;Bed level Lower Body Bathing Details (indicate cue type and reason): educated on use of long sponge at home, bathing seated for safety  Upper Body Dressing : Set up;Sitting   Lower Body Dressing: Sit to/from stand;Supervision/safety;Cueing for back precautions;Cueing for compensatory techniques;With adaptive equipment Lower Body Dressing Details (indicate cue type and reason): pt uses reacher at home, plans to wear slip on shoes; supervision sit to stand (SO will assist with socks as needed) Toilet Transfer: Supervision/safety;Ambulation     Toileting - Clothing Manipulation Details (indicate cue type and reason): reviewed compensatory techniques   Tub/Shower Transfer Details (indicate cue type and reason): verbally discussed walk in shower and techniques using 3:1 as shower chair, encouraged pt to not use "towel bar" as support bar  Functional mobility during ADLs: Supervision/safety General ADL Comments: patient educated on AE, ADL compensatory techniques and  back precautions      Vision Baseline Vision/History: Wears glasses Wears Glasses: At all times Patient Visual Report: No change from baseline Vision Assessment?: No apparent visual deficits     Perception     Praxis      Pertinent Vitals/Pain Pain Assessment: Faces Faces Pain Scale: Hurts a little bit Pain Location: back  incision  Pain Descriptors / Indicators: Discomfort;Grimacing;Sore Pain Intervention(s): Limited activity within patient's tolerance;Monitored during session;Repositioned     Hand Dominance     Extremity/Trunk Assessment Upper Extremity Assessment Upper Extremity Assessment: Overall WFL for tasks assessed   Lower Extremity Assessment Lower Extremity Assessment: Defer to PT evaluation   Cervical / Trunk Assessment Cervical / Trunk Assessment: Other exceptions Cervical / Trunk Exceptions: s/p back surgery   Communication Communication Communication: No difficulties   Cognition Arousal/Alertness: Awake/alert Behavior During Therapy: WFL for tasks assessed/performed Overall Cognitive Status: Within Functional Limits for tasks assessed                                     General Comments  noted mild shaking with mobility back to recliner, close supervision for safety; discussed mobilzation progression and energy conservation     Exercises     Shoulder Instructions      Home Living Family/patient expects to be discharged to:: Private residence Living Arrangements: Spouse/significant other Available Help at Discharge: Other (Comment);Available 24 hours/day (boyfriend) Type of Home: House       Home Layout: One level     Bathroom Shower/Tub: Occupational psychologist: Standard     Home Equipment: Financial controller: Reacher        Prior Functioning/Environment Level of Independence: Independent                 OT Problem List: Decreased knowledge of precautions;Decreased knowledge of use of DME or AE;Pain      OT Treatment/Interventions:      OT Goals(Current goals can be found in the care plan section) Acute Rehab OT Goals Patient Stated Goal: home today  OT Goal Formulation: With patient  OT Frequency:     Barriers to D/C:            Co-evaluation              AM-PAC OT "6 Clicks" Daily Activity      Outcome Measure Help from another person eating meals?: None Help from another person taking care of personal grooming?: None Help from another person toileting, which includes using toliet, bedpan, or urinal?: None Help from another person bathing (including washing, rinsing, drying)?: A Little Help from another person to put on and taking off regular upper body clothing?: None Help from another person to put on and taking off regular lower body clothing?: A Little 6 Click Score: 22   End of Session Equipment Utilized During Treatment: Back brace Nurse Communication: Mobility status  Activity Tolerance: Patient tolerated treatment well Patient left: in chair;with call bell/phone within reach  OT Visit Diagnosis: Other abnormalities of gait and mobility (R26.89);Pain Pain - part of body:  (back)                Time: 6767-2094 OT Time Calculation (min): 17 min Charges:  OT General Charges $OT Visit: 1 Visit OT Evaluation $OT Eval Low Complexity: 1 Low  Jolaine Artist, OT Acute Rehabilitation Services Pager [phone] Office [place]  S Sergi Gellner 08/09/2020, 10:05 AM

## 2020-08-09 NOTE — Evaluation (Signed)
Physical Therapy Evaluation Patient Details Name: Tiffany Robles MRN: [id number] DOB: 24-May-1956 Today's Date: 08/09/2020   History of Present Illness  Pt is a 64 y/o female with PMH of arthritis, anxiety, DM 2, cervical CA, PNA, HTN, L TKA, progressive worsening back and R LE pain. S/P PLIF L 4-5.     Clinical Impression  Pt admitted with above diagnosis. At the time of PT eval, pt was able to demonstrate transfers and ambulation with gross min guard assist to supervision for safety. Pt will have boyfriend available for assist upon d/c. Pt was educated on precautions, brace application/wearing schedule, appropriate activity progression, and car transfer. Pt currently with functional limitations due to the deficits listed below (see PT Problem List). Pt will benefit from skilled PT to increase their independence and safety with mobility to allow discharge to the venue listed below.      Follow Up Recommendations No PT follow up;Supervision for mobility/OOB    Equipment Recommendations  None recommended by PT    Recommendations for Other Services       Precautions / Restrictions Precautions Precautions: Fall;Back Precaution Booklet Issued: Yes (comment) Precaution Comments: reviewed with patient, able to recall 3/3 without cueing Required Braces or Orthoses: Spinal Brace Spinal Brace: Lumbar corset;Applied in sitting position (on upon entry ) Restrictions Weight Bearing Restrictions: No      Mobility  Bed Mobility Overal bed mobility: Needs Assistance Bed Mobility: Rolling;Sidelying to Sit Rolling: Supervision Sidelying to sit: Supervision       General bed mobility comments: VC's for sequencing and general safety with log roll technique.   Transfers Overall transfer level: Needs assistance Equipment used: None Transfers: Sit to/from Stand Sit to Stand: Supervision         General transfer comment: for safety, good posture and  technique  Ambulation/Gait Ambulation/Gait assistance: Min guard;Supervision Gait Distance (Feet): 300 Feet Assistive device: Rolling walker (2 wheeled) Gait Pattern/deviations: Step-through pattern;Decreased stride length;Trunk flexed Gait velocity: Decreased Gait velocity interpretation: <1.31 ft/sec, indicative of household ambulator General Gait Details: Slow and mildly unsteady. Pt with increased time/effort for full turn with walker.   Stairs Stairs: Yes Stairs assistance: Min guard Stair Management: One rail Right;Step to pattern;Forwards Number of Stairs: 2 General stair comments: VC's for sequencing and general safety.   Wheelchair Mobility    Modified Rankin (Stroke Patients Only)       Balance Overall balance assessment: Mild deficits observed, not formally tested                                           Pertinent Vitals/Pain Pain Assessment: Faces Faces Pain Scale: Hurts a little bit Pain Location: back incision  Pain Descriptors / Indicators: Discomfort;Grimacing;Sore Pain Intervention(s): Limited activity within patient's tolerance;Monitored during session;Repositioned    Home Living Family/patient expects to be discharged to:: Private residence Living Arrangements: Spouse/significant other Available Help at Discharge: Other (Comment);Available 24 hours/day (boyfriend) Type of Home: House Home Access: Stairs to enter   [employer] of Steps: 2 Home Layout: One level Home Equipment: Adaptive equipment;Walker - 2 wheels;Walker - 4 wheels      Prior Function Level of Independence: Independent               Hand Dominance        Extremity/Trunk Assessment   Upper Extremity Assessment Upper Extremity Assessment: Defer to OT evaluation    Lower  Extremity Assessment Lower Extremity Assessment: Generalized weakness (Consistent with pre-op diagnosis)    Cervical / Trunk Assessment Cervical / Trunk Assessment:  Other exceptions Cervical / Trunk Exceptions: s/p back surgery  Communication   Communication: No difficulties  Cognition Arousal/Alertness: Awake/alert Behavior During Therapy: WFL for tasks assessed/performed Overall Cognitive Status: Within Functional Limits for tasks assessed                                        General Comments General comments (skin integrity, edema, etc.): noted mild shaking with mobility back to recliner, close supervision for safety; discussed mobilzation progression and energy conservation     Exercises     Assessment/Plan    PT Assessment Patient needs continued PT services  PT Problem List Decreased strength;Decreased activity tolerance;Decreased balance;Decreased mobility;Decreased knowledge of use of DME;Decreased safety awareness;Decreased knowledge of precautions;Pain       PT Treatment Interventions DME instruction;Gait training;Stair training;Functional mobility training;Therapeutic activities;Therapeutic exercise;Neuromuscular re-education;Patient/family education    PT Goals (Current goals can be found in the Care Plan section)  Acute Rehab PT Goals Patient Stated Goal: home today  PT Goal Formulation: With patient Time For Goal Achievement: 08/16/20 Potential to Achieve Goals: Good    Frequency Min 5X/week   Barriers to discharge        Co-evaluation               AM-PAC PT "6 Clicks" Mobility  Outcome Measure Help needed turning from your back to your side while in a flat bed without using bedrails?: None Help needed moving from lying on your back to sitting on the side of a flat bed without using bedrails?: None Help needed moving to and from a bed to a chair (including a wheelchair)?: None Help needed standing up from a chair using your arms (e.g., wheelchair or bedside chair)?: None Help needed to walk in hospital room?: A Little Help needed climbing 3-5 steps with a railing? : A Little 6 Click Score:  22    End of Session Equipment Utilized During Treatment: Gait belt;Back brace Activity Tolerance: Patient tolerated treatment well Patient left: in chair;with call bell/phone within reach Nurse Communication: Mobility status PT Visit Diagnosis: Unsteadiness on feet (R26.81);Pain Pain - part of body:  (back)    Time: 6378-5885 PT Time Calculation (min) (ACUTE ONLY): 32 min   Charges:   PT Evaluation $PT Eval Low Complexity: 1 Low PT Treatments $Gait Training: 8-22 mins        Rolinda Roan, PT, DPT Acute Rehabilitation Services Pager: [phone] Office: [phone]   Thelma Comp 08/09/2020, 11:28 AM

## 2020-08-09 NOTE — Discharge Summary (Signed)
Physician Discharge Summary  Patient ID: Tiffany Robles MRN: [id number] DOB/AGE: 1956/05/06 64 y.o.  Admit date: 08/08/2020 Discharge date: 08/09/2020  Admission Diagnoses: Grade 1 spondylolisthesis with instability L4-5 severe spinal stenosis L4-5 right-sided L4-L5 radiculopathy    Discharge Diagnoses: same   Discharged Condition: good  Hospital Course: The patient was admitted on 08/08/2020 and taken to the operating room where the patient underwent PLIF L4-5. The patient tolerated the procedure well and was taken to the recovery room and then to the floor in stable condition. The hospital course was routine. There were no complications. The wound remained clean dry and intact. Pt had appropriate back soreness. No complaints of leg pain or new N/T/W. The patient remained afebrile with stable vital signs, and tolerated a regular diet. The patient continued to increase activities, and pain was well controlled with oral pain medications.   Consults: None  Significant Diagnostic Studies:  Results for orders placed or performed during the hospital encounter of 08/08/20  Glucose, capillary  Result Value Ref Range   Glucose-Capillary 154 (H) 70 - 99 mg/dL  Glucose, capillary  Result Value Ref Range   Glucose-Capillary 201 (H) 70 - 99 mg/dL   Comment 1 Notify RN    Comment 2 Document in Chart   Glucose, capillary  Result Value Ref Range   Glucose-Capillary 377 (H) 70 - 99 mg/dL  Glucose, capillary  Result Value Ref Range   Glucose-Capillary 396 (H) 70 - 99 mg/dL   Comment 1 Notify RN    Comment 2 Document in Chart   Glucose, capillary  Result Value Ref Range   Glucose-Capillary 281 (H) 70 - 99 mg/dL  Glucose, capillary  Result Value Ref Range   Glucose-Capillary 192 (H) 70 - 99 mg/dL   Comment 1 Notify RN    Comment 2 Document in Chart   ABO/Rh  Result Value Ref Range   ABO/RH(D)      O POS Performed at [facility], [address], [place], [place] [address]     DG  Lumbar Spine 2-3 Views  Result Date: 08/08/2020 CLINICAL DATA:  Surgery, elective. Additional history provided: L4-5 PLIF. Reported fluoroscopy time 1 minutes, 2 seconds, 75.65 mGy. EXAM: LUMBAR SPINE - 2-3 VIEW; DG C-ARM 1-60 MIN COMPARISON:  Lumbar CT myelogram 06/26/2020. FINDINGS: PA and lateral view intraoperative fluoroscopic images of the lumbar spine are submitted, 2 images total. The images demonstrate bilateral pedicle screws at the L4 and L5 levels with L4-L5 interbody spacer. Vertical interconnecting rods were not present at the time the images were taken. IMPRESSION: Two intraoperative fluoroscopic images of the lumbar spine from L4-L5 PLIF as described. Electronically Signed   By: Kellie Simmering DO   On: 08/08/2020 11:48   DG C-Arm 1-60 Min  Result Date: 08/08/2020 CLINICAL DATA:  Surgery, elective. Additional history provided: L4-5 PLIF. Reported fluoroscopy time 1 minutes, 2 seconds, 75.65 mGy. EXAM: LUMBAR SPINE - 2-3 VIEW; DG C-ARM 1-60 MIN COMPARISON:  Lumbar CT myelogram 06/26/2020. FINDINGS: PA and lateral view intraoperative fluoroscopic images of the lumbar spine are submitted, 2 images total. The images demonstrate bilateral pedicle screws at the L4 and L5 levels with L4-L5 interbody spacer. Vertical interconnecting rods were not present at the time the images were taken. IMPRESSION: Two intraoperative fluoroscopic images of the lumbar spine from L4-L5 PLIF as described. Electronically Signed   By: Kellie Simmering DO   On: 08/08/2020 11:48    Antibiotics:  Anti-infectives (From admission, onward)   Start  Dose/Rate Route Frequency Ordered Stop   08/08/20 2000  vancomycin (VANCOCIN) IVPB 1000 mg/200 mL premix        1,000 mg 200 mL/hr over 60 Minutes Intravenous  Once 08/08/20 1313 08/08/20 2038   08/08/20 0950  bacitracin 50,000 Units in sodium chloride 0.9 % 500 mL irrigation  Status:  Discontinued          As needed 08/08/20 0951 08/08/20 1119   08/08/20 0700  vancomycin  (VANCOCIN) IVPB 1000 mg/200 mL premix        1,000 mg 200 mL/hr over 60 Minutes Intravenous On call to O.R. 08/08/20 0648 08/08/20 1331      Discharge Exam: Blood pressure (!) 129/54, pulse 93, temperature 99.1 F (37.3 C), temperature source Oral, resp. rate 16, height 5\' 3"  (1.6 m), weight 88.1 kg, SpO2 95 %. Neurologic: Grossly normal Ambulating and voiding well, incision cdi  Discharge Medications:   Allergies as of 08/09/2020      Reactions   Penicillins Swelling, Other (See Comments)   Facial swelling Did it involve swelling of the face/tongue/throat, SOB, or low BP? Yes Did it involve sudden or severe rash/hives, skin peeling, or any reaction on the inside of your mouth or nose? No Did you need to seek medical attention at a hospital or doctor's office?    #  #  YES  #  #  When did it last happen?10+ years If all above answers are "NO", may proceed with cephalosporin use.   Augmentin [amoxicillin-pot Clavulanate] Nausea And Vomiting   Codeine Nausea And Vomiting   Ivp Dye [iodinated Diagnostic Agents] Hives, Itching   Just contrast dye , no reactions to other -dines    Keflex [cephalexin] Nausea And Vomiting      Medication List    STOP taking these medications   IBU 800 MG tablet Generic drug: ibuprofen   ibuprofen 600 MG tablet Commonly known as: ADVIL     TAKE these medications   atorvastatin 40 MG tablet Commonly known as: LIPITOR Take 40 mg by mouth at bedtime.   clonazePAM 0.5 MG tablet Commonly known as: KLONOPIN Take 0.5 mg by mouth 2 (two) times daily as needed (anxiety).   cyclobenzaprine 10 MG tablet Commonly known as: FLEXERIL Take 10 mg by mouth 3 (three) times daily. What changed: Another medication with the same name was added. Make sure you understand how and when to take each.   cyclobenzaprine 10 MG tablet Commonly known as: FLEXERIL Take 1 tablet (10 mg total) by mouth 3 (three) times daily as needed for muscle spasms. What  changed: You were already taking a medication with the same name, and this prescription was added. Make sure you understand how and when to take each.   escitalopram 10 MG tablet Commonly known as: LEXAPRO Take 10 mg by mouth daily.   gabapentin 400 MG capsule Commonly known as: NEURONTIN Take 400 mg by mouth 3 (three) times daily.   hydrOXYzine 25 MG tablet Commonly known as: ATARAX/VISTARIL Take 25 mg by mouth 3 (three) times daily.   Lantus SoloStar 100 UNIT/ML Solostar Pen Generic drug: insulin glargine Inject 30 Units into the skin daily.   lisinopril-hydrochlorothiazide 20-25 MG tablet Commonly known as: ZESTORETIC Take 1 tablet by mouth daily.   methylPREDNISolone 4 MG tablet Commonly known as: MEDROL Take 4 mg by mouth See admin instructions. Take these number of tablets on consecutive days 6-5-4-3-2-1   metoprolol tartrate 25 MG tablet Commonly known as: LOPRESSOR Take 0.5 tablets (  12.5 mg total) by mouth 2 (two) times daily.   multivitamin with minerals Tabs tablet Take 1 tablet by mouth in the morning and at bedtime. Complete Multivitamin for Women 50+   NovoLOG FlexPen 100 UNIT/ML FlexPen Generic drug: insulin aspart Inject 15 Units into the skin 4 (four) times daily -  before meals and at bedtime.   oxycodone 5 MG capsule Commonly known as: OXY-IR Take 10 mg by mouth every 6 (six) hours as needed for pain.   oxyCODONE-acetaminophen 5-325 MG tablet Commonly known as: Percocet Take 1 tablet by mouth every 4 (four) hours as needed for severe pain.   Trulicity 3 TK/1.6WF Sopn Generic drug: Dulaglutide Inject 3 mg into the skin every Saturday.       Disposition: home   Final Dx:  PLIF L4-5  Discharge Instructions     Remove dressing in 72 hours   Complete by: As directed    Call MD for:  difficulty breathing, headache or visual disturbances   Complete by: As directed    Call MD for:  hives   Complete by: As directed    Call MD for:  persistant  dizziness or light-headedness   Complete by: As directed    Call MD for:  persistant nausea and vomiting   Complete by: As directed    Call MD for:  redness, tenderness, or signs of infection (pain, swelling, redness, odor or green/yellow discharge around incision site)   Complete by: As directed    Call MD for:  severe uncontrolled pain   Complete by: As directed    Call MD for:  temperature >100.4   Complete by: As directed    Diet - low sodium heart healthy   Complete by: As directed    Driving Restrictions   Complete by: As directed    No driving for 2 weeks, no riding in the car for 1 week   Increase activity slowly   Complete by: As directed    Lifting restrictions   Complete by: As directed    No lifting more than 8 lbs         Signed: Ocie Cornfield Aziah Kaiser 08/09/2020, 8:09 AM

## 2020-09-04 DIAGNOSIS — Z6835 Body mass index (BMI) 35.0-35.9, adult: Secondary | ICD-10-CM | POA: Insufficient documentation

## 2020-09-07 ENCOUNTER — Other Ambulatory Visit: Payer: Self-pay | Admitting: Neurosurgery

## 2020-09-07 DIAGNOSIS — M5440 Lumbago with sciatica, unspecified side: Secondary | ICD-10-CM

## 2020-09-27 ENCOUNTER — Ambulatory Visit
Admission: RE | Admit: 2020-09-27 | Discharge: 2020-09-27 | Disposition: A | Discharge status: Home / Self Care | Payer: Medicare HMO | Source: Ambulatory Visit | Attending: Neurosurgery | Admitting: Neurosurgery

## 2020-09-27 ENCOUNTER — Other Ambulatory Visit: Payer: Self-pay

## 2020-09-27 DIAGNOSIS — M5440 Lumbago with sciatica, unspecified side: Secondary | ICD-10-CM

## 2020-10-02 DIAGNOSIS — M7989 Other specified soft tissue disorders: Secondary | ICD-10-CM | POA: Insufficient documentation

## 2020-10-02 DIAGNOSIS — R030 Elevated blood-pressure reading, without diagnosis of hypertension: Secondary | ICD-10-CM | POA: Insufficient documentation

## 2020-10-22 ENCOUNTER — Ambulatory Visit (HOSPITAL_COMMUNITY)
Admission: RE | Admit: 2020-10-22 | Discharge: 2020-10-22 | Disposition: A | Discharge status: Home / Self Care | Payer: Medicare HMO | Source: Ambulatory Visit | Attending: Neurosurgery | Admitting: Neurosurgery

## 2020-10-22 ENCOUNTER — Encounter (HOSPITAL_COMMUNITY): Payer: Medicare HMO

## 2020-10-22 ENCOUNTER — Other Ambulatory Visit: Payer: Self-pay

## 2020-10-22 ENCOUNTER — Other Ambulatory Visit (HOSPITAL_COMMUNITY): Payer: Self-pay | Admitting: Neurosurgery

## 2020-10-22 DIAGNOSIS — M7989 Other specified soft tissue disorders: Secondary | ICD-10-CM | POA: Diagnosis not present

## 2020-12-21 ENCOUNTER — Other Ambulatory Visit: Payer: Self-pay | Admitting: Neurosurgery

## 2020-12-21 ENCOUNTER — Other Ambulatory Visit: Payer: Medicare HMO | Admitting: Neurosurgery

## 2020-12-21 DIAGNOSIS — M48062 Spinal stenosis, lumbar region with neurogenic claudication: Secondary | ICD-10-CM

## 2021-01-08 IMAGING — CR DG KNEE 1-2V*L*
2 series · 2 of 2 positions shown · non-contrast
Comparison: 06/07/2019

CLINICAL DATA: Fall, pain

EXAM:
LEFT KNEE - 1-2 VIEW

[t knee ap left]
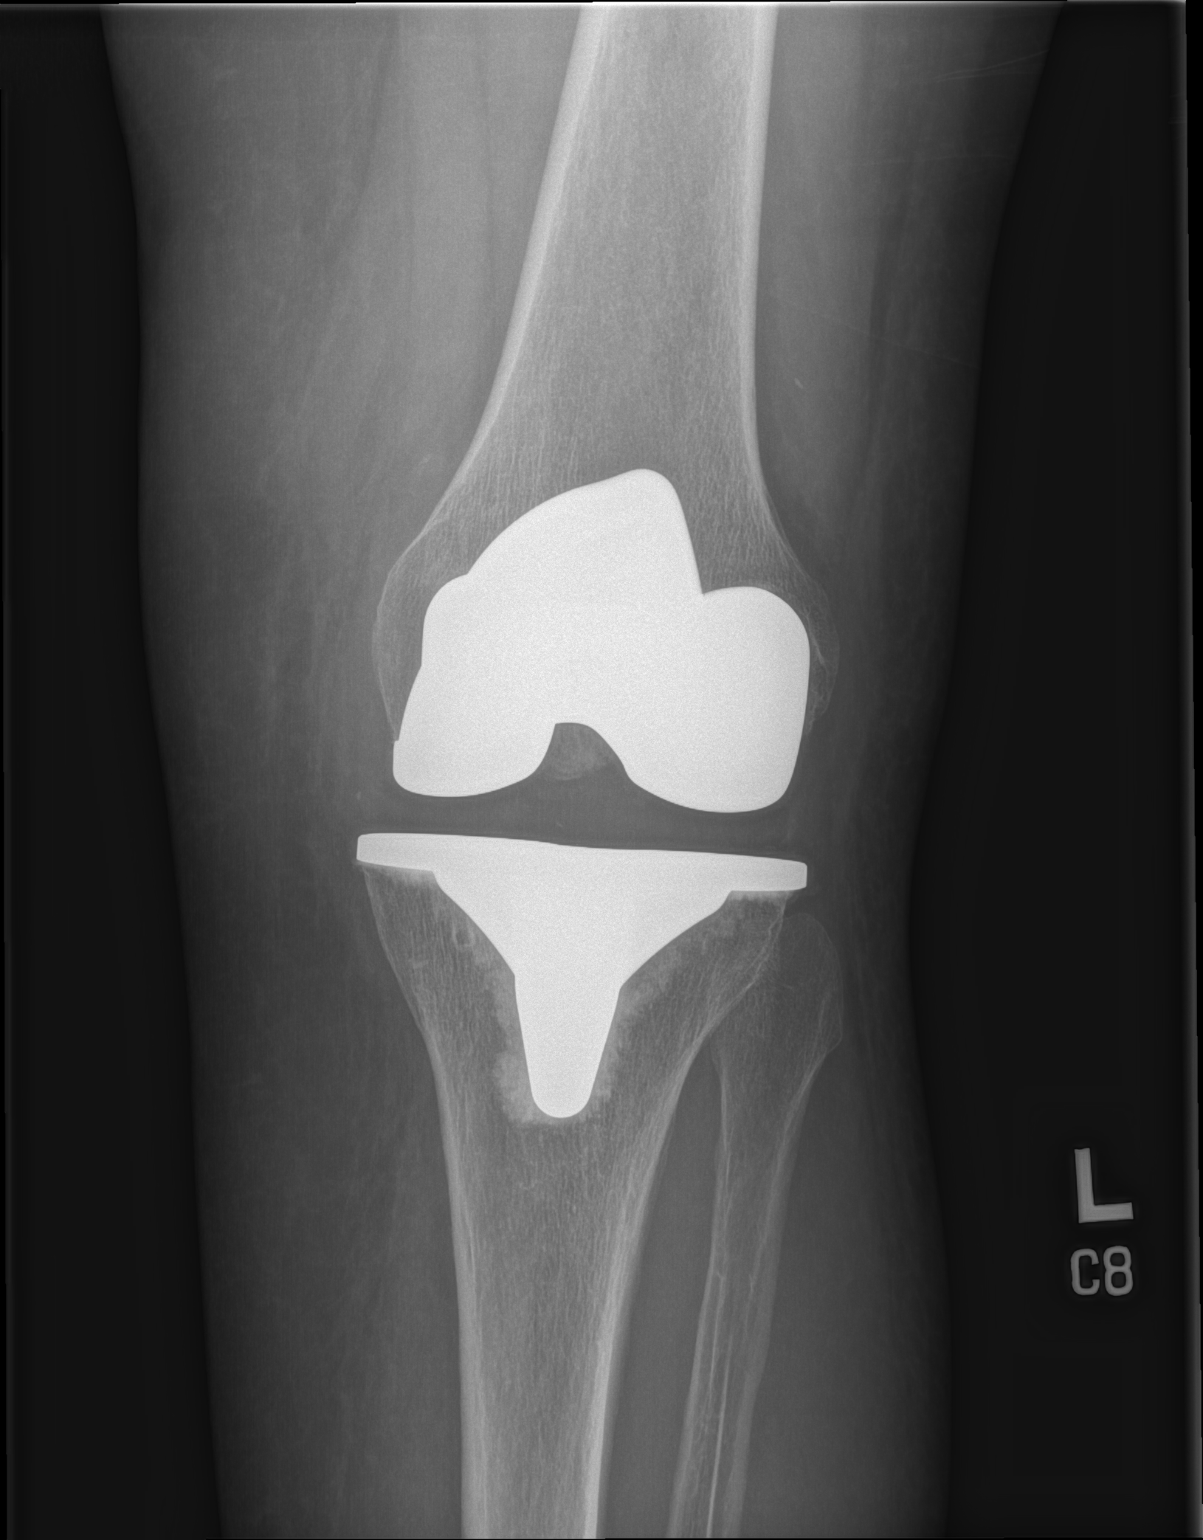

[t knee lat left]
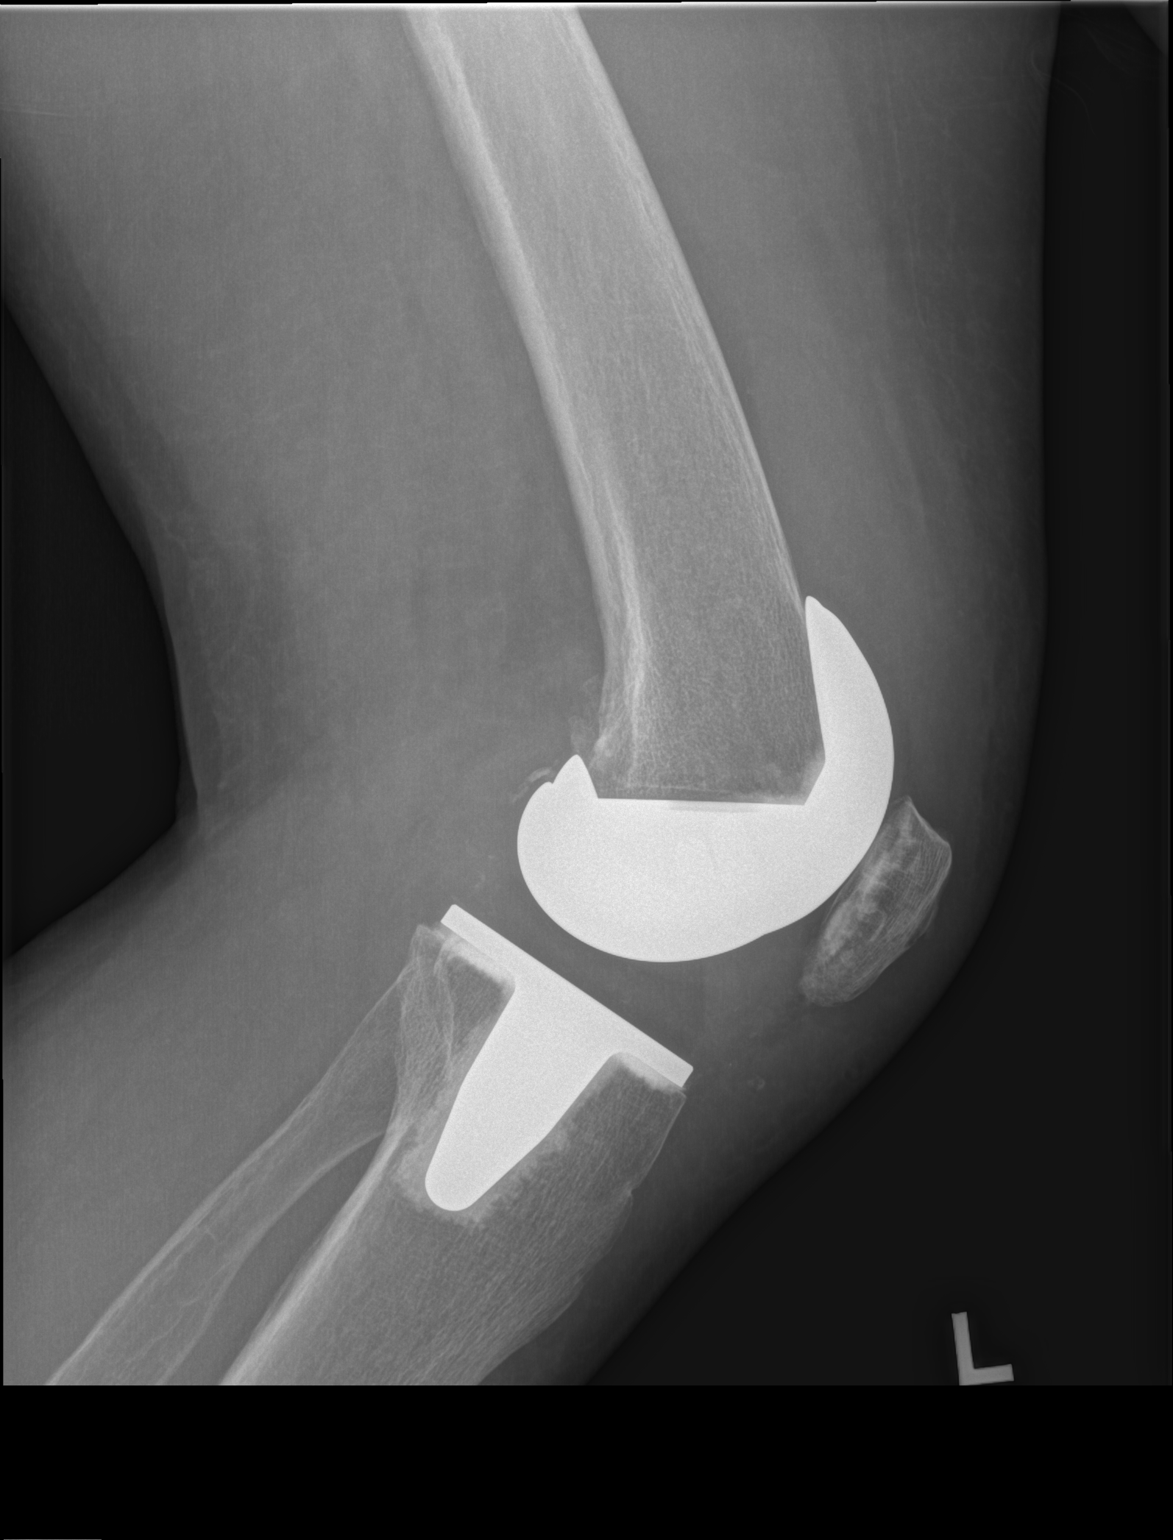

[2 of 2 positions shown; findings below may reference images not displayed]

FINDINGS: Interval left total knee arthroplasty. Joint effusion is present.
There is no acute fracture.
IMPRESSION: No acute fracture.  Joint effusion is present.

## 2021-01-10 ENCOUNTER — Ambulatory Visit (INDEPENDENT_AMBULATORY_CARE_PROVIDER_SITE_OTHER): Payer: Medicare HMO | Admitting: Neurology

## 2021-01-10 ENCOUNTER — Encounter: Payer: Self-pay | Admitting: Neurology

## 2021-01-10 VITALS — BP 132/70 | HR 64 | Ht 63.0 in | Wt 210.0 lb

## 2021-01-10 DIAGNOSIS — R202 Paresthesia of skin: Secondary | ICD-10-CM | POA: Diagnosis not present

## 2021-01-10 DIAGNOSIS — M5416 Radiculopathy, lumbar region: Secondary | ICD-10-CM | POA: Diagnosis not present

## 2021-01-10 MED ORDER — GABAPENTIN 400 MG PO CAPS
800.0000 mg | ORAL_CAPSULE | Freq: Three times a day (TID) | ORAL | 11 refills | Status: DC
Start: 2021-01-10 — End: 2023-07-14

## 2021-01-10 NOTE — Progress Notes (Signed)
Chief Complaint  Patient presents with  . New Patient (Initial Visit)    Rm 16, alone. Paper referral from Vassie Moment, MD (PCP) for lower leg pain, neuropathy. Having pain in lower back on right side. Radiates down to leg. Cannot stand for long periods of time. Denies falls. If she sits too lon, gets stiff. Had back surgery 10/31/19 by Dr. Saintclair Halsted. Dr. Saintclair Halsted called in meloxicam which has helped but did not alleviate pain. Has MRI lumbar scheduled at [facility] this Sunday. She has intermittent numb/ting in feet. Cannot wear shoes that tie, hurts.     HISTORICAL  Tiffany Robles is a 65 year old female, seen in request by her primary care physician Dr. Lavonia Drafts, [facility], for evaluation of low back pain, radiating pain for lower extremity, initial evaluation was on January 10, 2021  I reviewed and summarized the referring note.PMHX. DM, II for more than 20 years,  HTN HLD Depression uteral Cervical cancer,   She had a history of lumbar decompression surgery by Dr. Saintclair Halsted on October 31, 2019, prior to the surgery, she had a significant low back pain, surgery did help her transiently, reviewed surgical record, decompressive lumbar laminectomy L4-5 with complete medial facetectomy radical foraminotomies of L4-5 nerve roots bilaterally, lumbar interbody fusion L4-5  Shortly afterwards, she began to experience recurrent symptoms, after standing for short period of time, such as washing dishes, grocery shopping, she would develop recurrent low back, has to sit down and resting for a while, lie down seems to be the most effective posturing, and will alleviate her low back pressure, alleviated for low back pain  She denies bowel and bladder incontinence also complains of bilateral lower extremity paresthesia, to mid calf level, she has tried meloxicam for low back pain, with limited help,  REVIEW OF SYSTEMS: Full 14 system review of systems performed and notable only for as above All other review of  systems were negative.  ALLERGIES: Allergies  Allergen Reactions  . Penicillins Swelling and Other (See Comments)    Facial swelling Did it involve swelling of the face/tongue/throat, SOB, or low BP? Yes Did it involve sudden or severe rash/hives, skin peeling, or any reaction on the inside of your mouth or nose? No Did you need to seek medical attention at a hospital or doctor's office?    #  #  YES  #  #  When did it last happen?10+ years If all above answers are "NO", may proceed with cephalosporin use.   . Augmentin [Amoxicillin-Pot Clavulanate] Nausea And Vomiting  . Codeine Nausea And Vomiting  . Ivp Dye [Iodinated Diagnostic Agents] Hives and Itching    Just contrast dye , no reactions to other -dines   . Keflex [Cephalexin] Nausea And Vomiting    HOME MEDICATIONS: Current Outpatient Medications  Medication Sig Dispense Refill  . atorvastatin (LIPITOR) 40 MG tablet Take 40 mg by mouth at bedtime.     . clonazePAM (KLONOPIN) 0.5 MG tablet Take 0.5 mg by mouth 2 (two) times daily as needed (anxiety).     . cyclobenzaprine (FLEXERIL) 10 MG tablet Take 10 mg by mouth 3 (three) times daily.    . cyclobenzaprine (FLEXERIL) 10 MG tablet Take 1 tablet (10 mg total) by mouth 3 (three) times daily as needed for muscle spasms. 30 tablet 0  . escitalopram (LEXAPRO) 10 MG tablet Take 10 mg by mouth daily.    Marland Kitchen gabapentin (NEURONTIN) 400 MG capsule Take 400 mg by mouth 3 (three) times daily.    Marland Kitchen  hydrOXYzine (ATARAX/VISTARIL) 25 MG tablet Take 25 mg by mouth 3 (three) times daily.    Marland Kitchen LANTUS SOLOSTAR 100 UNIT/ML Solostar Pen Inject 30 Units into the skin daily.    Marland Kitchen lisinopril-hydrochlorothiazide (ZESTORETIC) 20-25 MG tablet Take 1 tablet by mouth daily.    . methylPREDNISolone (MEDROL) 4 MG tablet Take 4 mg by mouth See admin instructions. Take these number of tablets on consecutive days 6-5-4-3-2-1    . metoprolol tartrate (LOPRESSOR) 25 MG tablet Take 0.5 tablets (12.5 mg total)  by mouth 2 (two) times daily. 60 tablet 0  . Multiple Vitamin (MULTIVITAMIN WITH MINERALS) TABS tablet Take 1 tablet by mouth in the morning and at bedtime. Complete Multivitamin for Women 50+    . NOVOLOG FLEXPEN 100 UNIT/ML FlexPen Inject 15 Units into the skin 4 (four) times daily -  before meals and at bedtime.    Marland Kitchen oxycodone (OXY-IR) 5 MG capsule Take 10 mg by mouth every 6 (six) hours as needed for pain.    Marland Kitchen oxyCODONE-acetaminophen (PERCOCET) 5-325 MG tablet Take 1 tablet by mouth every 4 (four) hours as needed for severe pain. 20 tablet 0  . TRULICITY 3 AQ/7.6AU SOPN Inject 3 mg into the skin every Saturday.     No current facility-administered medications for this visit.    PAST MEDICAL HISTORY: Past Medical History:  Diagnosis Date  . Anxiety   . Arthritis   . Asthma    childhood hx.  . Cancer ([place])    30 yrs, ago, cervical  . Depression   . Diabetes mellitus without complication ([place])    type 2   . Family history of adverse reaction to anesthesia    mother allergic to Anesthesia drug- had a very bad headache  . High cholesterol   . Hypertension   . Pneumonia   . Primary localized osteoarthritis of left knee   . Syncope and collapse    first occurrence 40 years ago , last occurrence 10 years ago , saw cardio for eval , underwent echo and per patient results were normal and was told to f/u with cardio if syncope reoccurs, patient reports no reoccurence of syncope since that time .    PAST SURGICAL HISTORY: Past Surgical History:  Procedure Laterality Date  . ABDOMINAL HYSTERECTOMY    . BACK SURGERY    . DILATION AND CURETTAGE OF UTERUS    . JOINT REPLACEMENT Left 10/26/020   Knee  . LUMBAR LAMINECTOMY/DECOMPRESSION MICRODISCECTOMY Left 10/31/2019   Procedure: Laminectomy and Foraminotomy - left - Lumbar four-Lumbar five;  Surgeon: Kary Kos, MD;  Location: [facility];  Service: Neurosurgery;  Laterality: Left;  . TOTAL KNEE ARTHROPLASTY Left 09/26/2019   Procedure:  TOTAL KNEE ARTHROPLASTY;  Surgeon: Elsie Saas, MD;  Location: [facility];  Service: Orthopedics;  Laterality: Left;    FAMILY HISTORY: Family History  Problem Relation Age of Onset  . Diabetes Mother   . Hypertension Mother   . Heart Problems Mother   . High Cholesterol Mother   . Diabetes Father   . Hypertension Father   . Kidney cancer Father        One kidney removed d/t cancer  . High Cholesterol Father   . Diabetes Brother   . Hypertension Brother   . Cancer Brother        [place]  . Breast cancer Neg Hx     SOCIAL HISTORY: Social History   Socioeconomic History  . Marital status: Significant Other    Spouse name:  Dominica Severin  . Number of children: 4  . Years of education: 84  . Highest education level: 12th grade  Occupational History  . Occupation: Retired   Tobacco Use  . Smoking status: Former Smoker    Packs/day: 3.00    Years: 20.00    Pack years: 60.00    Types: Cigarettes  . Smokeless tobacco: Never Used  Vaping Use  . Vaping Use: Never used  Substance and Sexual Activity  . Alcohol use: Yes    Comment: occassionally  . Drug use: Never  . Sexual activity: Yes    Birth control/protection: None, Surgical  Other Topics Concern  . Not on file  Social History Narrative   Right handed   Drinks coffee/soda daily   Lives with significant other, Dominica Severin   Social Determinants of Health   Financial Resource Strain: Not on file  Food Insecurity: Not on file  Transportation Needs: Not on file  Physical Activity: Not on file  Stress: Not on file  Social Connections: Not on file  Intimate Partner Violence: Not on file     PHYSICAL EXAM   Vitals:   01/10/21 1356  BP: 132/70  Pulse: 64  SpO2: 98%  Weight: 210 lb (95.3 kg)  Height: 5\' 3"  (1.6 m)   Not recorded     Body mass index is 37.2 kg/m.  PHYSICAL EXAMNIATION:  Gen: NAD, conversant, well nourised, well groomed                     Cardiovascular: Regular rate rhythm, no peripheral edema,  warm, nontender. Eyes: Conjunctivae clear without exudates or hemorrhage Neck: Supple, no carotid bruits. Pulmonary: Clear to auscultation bilaterally   NEUROLOGICAL EXAM:  MENTAL STATUS: Speech:    Speech is normal; fluent and spontaneous with normal comprehension.  Cognition:     Orientation to time, place and person     Normal recent and remote memory     Normal Attention span and concentration     Normal Language, naming, repeating,spontaneous speech     Fund of knowledge   CRANIAL NERVES: CN II: Visual fields are full to confrontation. Pupils are round equal and briskly reactive to light. CN III, IV, VI: extraocular movement are normal. No ptosis. CN V: Facial sensation is intact to light touch CN VII: Face is symmetric with normal eye closure  CN VIII: Hearing is normal to causal conversation. CN IX, X: Phonation is normal. CN XI: Head turning and shoulder shrug are intact  MOTOR: There is no pronator drift of out-stretched arms. Muscle bulk and tone are normal. Muscle strength is normal.  REFLEXES: Reflexes are 2+ and symmetric at the biceps, triceps, knees, and absent at ankles. Plantar responses are flexor.  SENSORY: Mental dependent decreased light touch, pinprick to mid shin level  COORDINATION: There is no trunk or limb dysmetria noted.  GAIT/STANCE: She needs push-up to get up from seated position, cautious, mildly unsteady   DIAGNOSTIC DATA (LABS, IMAGING, TESTING) - I reviewed patient records, labs, notes, testing and imaging myself where available.   ASSESSMENT AND PLAN  Tiffany Robles is a 65 y.o. female   Bilateral lower extremity paresthesia History of lumbar spondylosis, status post L4-5 decompression surgery, with recurrent low back pain radiating pain to bilateral lower extremity  Differentiation diagnosis peripheral neuropathy versus recurrent lumbar radiculopathy  MRI of lumbar is pending  EMG nerve conduction study  Gabapentin 400 mg 3  times a day  Referral to physical therapy  Marcial Pacas, M.D. Ph.D.  [facility] [address] [place], [place] [address] Ph: [phone] Fax: [phone]  CC:  Vassie Moment, [place],  [place] [address]

## 2021-01-13 ENCOUNTER — Ambulatory Visit
Admission: RE | Admit: 2021-01-13 | Discharge: 2021-01-13 | Disposition: A | Discharge status: Home / Self Care | Payer: Medicare HMO | Source: Ambulatory Visit | Attending: Neurosurgery | Admitting: Neurosurgery

## 2021-01-13 ENCOUNTER — Other Ambulatory Visit: Payer: Self-pay | Admitting: Neurosurgery

## 2021-01-13 ENCOUNTER — Other Ambulatory Visit: Payer: Self-pay

## 2021-01-13 DIAGNOSIS — M48062 Spinal stenosis, lumbar region with neurogenic claudication: Secondary | ICD-10-CM

## 2021-01-18 ENCOUNTER — Encounter: Payer: Self-pay | Admitting: Neurology

## 2021-01-23 ENCOUNTER — Encounter: Payer: Medicare HMO | Admitting: Neurology

## 2021-02-06 ENCOUNTER — Ambulatory Visit (INDEPENDENT_AMBULATORY_CARE_PROVIDER_SITE_OTHER): Payer: Medicare HMO | Admitting: Neurology

## 2021-02-06 DIAGNOSIS — M5416 Radiculopathy, lumbar region: Secondary | ICD-10-CM | POA: Diagnosis not present

## 2021-02-06 DIAGNOSIS — R202 Paresthesia of skin: Secondary | ICD-10-CM

## 2021-02-06 DIAGNOSIS — E1142 Type 2 diabetes mellitus with diabetic polyneuropathy: Secondary | ICD-10-CM | POA: Diagnosis not present

## 2021-02-06 NOTE — Procedures (Signed)
Full Name: Tiffany Robles Gender: Female MRN #: [id number] Date of Birth: 1956-09-14    Visit Date: 02/06/2021 08:18 Age: 65 Years Examining Physician: Marcial Pacas, MD  Referring Physician: Marcial Pacas, MD History: 65 year old female with long history of diabetes, chronic low back pain, status post lumbar decompression surgery, continue complains of mild cross midline low back pain, occasionally radiating pain along the right L5 dermatome.  Summary of the test:  Nerve conduction study:  Bilateral superficial peroneal sensory responses were absent.  Bilateral sural sensory responses showed moderately decreased snap amplitude.  Left tibial, peroneal motor responses were normal.  Right tibial, peroneal to EDB motor response showed mildly decreased CMAP amplitudes were otherwise normal.  Electromyography: Selected needle examination of bilateral lower extremity muscles, and bilateral lumbosacral paraspinal muscles were performed.  There was evidence of mild chronic neuropathic changes at bilateral tibialis anterior mixed with normal morphology motor unit potential, with fairly normal recruitment potential.  There was mildly increased insertional activity at bilateral mid and lower lumbar paraspinals along well-healed midline lumbar scar  Conclusion: This is an abnormal study.  There is electrodiagnostic evidence of mild axonal sensorimotor polyneuropathy, consistent with long history of diabetes.  There is very slight bilateral L4 radiculopathy, no evidence of active process.    ------------------------------- Marcial Pacas M.D. PhD  [facility] [address], [place], [place] [address] Tel: [phone] Fax: [phone]  Verbal informed consent was obtained from the patient, patient was informed of potential risk of procedure, including bruising, bleeding, hematoma formation, infection, muscle weakness, muscle pain, numbness, among others.        [place]     Nerve / Sites Muscle Latency Ref. Amplitude Ref. Rel Amp Segments Distance Velocity Ref. Area    ms ms mV mV %  cm m/s m/s mVms  R Peroneal - EDB     Ankle EDB 3.6 ?6.5 1.5 ?2.0 100 Ankle - EDB 9   7.7     Fib head EDB 7.7  0.7  48.8 Fib head - Ankle 23 56 ?44 3.3     Pop fossa EDB 9.5  1.3  177 Pop fossa - Fib head 10 55 ?44 6.1         Pop fossa - Ankle      L Peroneal - EDB     Ankle EDB 3.1 ?6.5 2.8 ?2.0 100 Ankle - EDB 9   9.1     Fib head EDB 8.6  0.9  31.5 Fib head - Ankle 24 44 ?44 3.0     Pop fossa EDB 10.9  2.5  287 Pop fossa - Fib head 10 44 ?44 7.9         Pop fossa - Ankle      R Tibial - AH     Ankle AH 3.6 ?5.8 3.5 ?4.0 100 Ankle - AH 9   9.5     Pop fossa AH 11.5  3.1  90.5 Pop fossa - Ankle 34 43 ?41 10.4  L Tibial - AH     Ankle AH 3.5 ?5.8 4.2 ?4.0 100 Ankle - AH 9   11.1     Pop fossa AH 12.0  3.4  81.9 Pop fossa - Ankle 35 41 ?41 9.7             SNC    Nerve / Sites Rec. Site Peak Lat Ref.  Amp Ref. Segments Distance    ms ms V V  cm  R Sural - Ankle (Calf)     Calf Ankle 3.8 ?4.4 3 ?6 Calf - Ankle 14  L Sural - Ankle (Calf)     Calf Ankle 3.0 ?4.4 3 ?6 Calf - Ankle 14  R Superficial peroneal - Ankle     Lat leg Ankle NR ?4.4 NR ?6 Lat leg - Ankle 14  L Superficial peroneal - Ankle     Lat leg Ankle NR ?4.4 NR ?6 Lat leg - Ankle 14             F  Wave    Nerve F Lat Ref.   ms ms  R Tibial - AH 58.4 ?56.0  L Tibial - AH 58.1 ?56.0         H Reflex    Nerve H Lat   ms   Left Right Ref.  Tibial - [place] 39.0 37.0 ?35.0         EMG Summary Table    Spontaneous MUAP Recruitment  Muscle IA Fib PSW Fasc Other Amp Dur. Poly Pattern  R. Tibialis anterior Normal None None None _______ Normal Normal 1+ Reduced  R. Tibialis posterior Normal None None None _______ Normal Normal Normal Normal  R. Peroneus longus Normal None None None _______ Normal Normal Normal Normal  R. Gastrocnemius (Medial head) Normal None None None _______ Normal Normal Normal  Normal  R. Vastus lateralis Normal None None None _______ Normal Normal Normal Normal  L. Tibialis posterior Normal None None None _______ Normal Normal Normal Normal  L. Tibialis anterior Normal None None None _______ Normal Normal Normal  mildly reduced  L. Peroneus longus Normal None None None _______ Normal Normal Normal Normal  L. Vastus lateralis Normal None None None _______ Normal Normal Normal Normal  R. Lumbar paraspinals (low) Normal None None None _______ Normal Normal Normal Normal  R. Lumbar paraspinals (mid) Normal None None None _______ Normal Normal Normal Normal  L. Lumbar paraspinals (low) Normal None None None _______ Normal Normal Normal Normal  L. Lumbar paraspinals (mid) Normal None None None _______ Normal Normal Normal Normal

## 2021-02-06 NOTE — Progress Notes (Signed)
No chief complaint on file.   HISTORICAL  Tiffany Robles is a 65 year old female, seen in request by her primary care physician Dr. Lavonia Drafts, [facility], for evaluation of low back pain, radiating pain for lower extremity, initial evaluation was on January 10, 2021  I reviewed and summarized the referring note.PMHX. DM, II for more than 20 years,  HTN HLD Depression uteral Cervical cancer,   She had a history of lumbar decompression surgery by Dr. Saintclair Halsted on October 31, 2019, prior to the surgery, she had a significant low back pain, surgery did help her transiently, reviewed surgical record, decompressive lumbar laminectomy L4-5 with complete medial facetectomy radical foraminotomies of L4-5 nerve roots bilaterally, lumbar interbody fusion L4-5  Shortly afterwards, she began to experience recurrent symptoms, after standing for short period of time, such as washing dishes, grocery shopping, she would develop recurrent low back, has to sit down and resting for a while, lie down seems to be the most effective posturing, and will alleviate her low back pressure, alleviated for low back pain  She denies bowel and bladder incontinence also complains of bilateral lower extremity paresthesia, to mid calf level, she has tried meloxicam for low back pain, with limited help,  UPDATE February 06 2021: Continue complains of cross midline low back pain, occasionally radiating pain to right lateral thigh, lateral leg, top of right foot to first toe, but overall 75% improvement compared to presurgical level  We again personally reviewed CT lumbar, and MRI of lumbar in February 2022 postsurgical: L4-5, posterior decompression and spine fusion, canal widely open, 34, left laminotomy, no significant canal stenosis, L5-S1, multifactorial moderate left neural foraminal narrowing, disc osteophyte ridges may contact the exiting L5 nerve roots beyond the neuroforamen.  She is tolerating gabapentin 400 mg 2 tablets 3 times  a day, creatinine was 1.08, REVIEW OF SYSTEMS: Full 14 system review of systems performed and notable only for as above All other review of systems were negative.  ALLERGIES: Allergies  Allergen Reactions  . Penicillins Swelling and Other (See Comments)    Facial swelling Did it involve swelling of the face/tongue/throat, SOB, or low BP? Yes Did it involve sudden or severe rash/hives, skin peeling, or any reaction on the inside of your mouth or nose? No Did you need to seek medical attention at a hospital or doctor's office?    #  #  YES  #  #  When did it last happen?10+ years If all above answers are "NO", may proceed with cephalosporin use.   . Augmentin [Amoxicillin-Pot Clavulanate] Nausea And Vomiting  . Codeine Nausea And Vomiting  . Ivp Dye [Iodinated Diagnostic Agents] Hives and Itching    Just contrast dye , no reactions to other -dines   . Keflex [Cephalexin] Nausea And Vomiting    HOME MEDICATIONS: Current Outpatient Medications  Medication Sig Dispense Refill  . atorvastatin (LIPITOR) 40 MG tablet Take 40 mg by mouth at bedtime.     . clonazePAM (KLONOPIN) 0.5 MG tablet Take 0.5 mg by mouth 2 (two) times daily as needed (anxiety).     . cyclobenzaprine (FLEXERIL) 10 MG tablet Take 10 mg by mouth 3 (three) times daily.    . cyclobenzaprine (FLEXERIL) 10 MG tablet Take 1 tablet (10 mg total) by mouth 3 (three) times daily as needed for muscle spasms. 30 tablet 0  . escitalopram (LEXAPRO) 10 MG tablet Take 10 mg by mouth daily.    Marland Kitchen gabapentin (NEURONTIN) 400 MG capsule Take 2 capsules (800  mg total) by mouth 3 (three) times daily. 180 capsule 11  . hydrOXYzine (ATARAX/VISTARIL) 25 MG tablet Take 25 mg by mouth 3 (three) times daily.    Marland Kitchen LANTUS SOLOSTAR 100 UNIT/ML Solostar Pen Inject 30 Units into the skin daily.    Marland Kitchen lisinopril-hydrochlorothiazide (ZESTORETIC) 20-25 MG tablet Take 1 tablet by mouth daily.    . methylPREDNISolone (MEDROL) 4 MG tablet Take 4 mg by  mouth See admin instructions. Take these number of tablets on consecutive days 6-5-4-3-2-1    . metoprolol tartrate (LOPRESSOR) 25 MG tablet Take 0.5 tablets (12.5 mg total) by mouth 2 (two) times daily. 60 tablet 0  . Multiple Vitamin (MULTIVITAMIN WITH MINERALS) TABS tablet Take 1 tablet by mouth in the morning and at bedtime. Complete Multivitamin for Women 50+    . NOVOLOG FLEXPEN 100 UNIT/ML FlexPen Inject 15 Units into the skin 4 (four) times daily -  before meals and at bedtime.    Marland Kitchen oxyCODONE-acetaminophen (PERCOCET) 5-325 MG tablet Take 1 tablet by mouth every 4 (four) hours as needed for severe pain. 20 tablet 0  . TRULICITY 3 NK/5.3ZJ SOPN Inject 3 mg into the skin every Saturday.     No current facility-administered medications for this visit.    PAST MEDICAL HISTORY: Past Medical History:  Diagnosis Date  . Anxiety   . Arthritis   . Asthma    childhood hx.  . Cancer ([place])    30 yrs, ago, cervical  . Depression   . Diabetes mellitus without complication ([place])    type 2   . Family history of adverse reaction to anesthesia    mother allergic to Anesthesia drug- had a very bad headache  . High cholesterol   . Hypertension   . Pneumonia   . Primary localized osteoarthritis of left knee   . Syncope and collapse    first occurrence 40 years ago , last occurrence 10 years ago , saw cardio for eval , underwent echo and per patient results were normal and was told to f/u with cardio if syncope reoccurs, patient reports no reoccurence of syncope since that time .    PAST SURGICAL HISTORY: Past Surgical History:  Procedure Laterality Date  . ABDOMINAL HYSTERECTOMY    . BACK SURGERY    . DILATION AND CURETTAGE OF UTERUS    . JOINT REPLACEMENT Left 10/26/020   Knee  . LUMBAR LAMINECTOMY/DECOMPRESSION MICRODISCECTOMY Left 10/31/2019   Procedure: Laminectomy and Foraminotomy - left - Lumbar four-Lumbar five;  Surgeon: Kary Kos, MD;  Location: [name];  Service: Neurosurgery;   Laterality: Left;  . TOTAL KNEE ARTHROPLASTY Left 09/26/2019   Procedure: TOTAL KNEE ARTHROPLASTY;  Surgeon: Elsie Saas, MD;  Location: [facility];  Service: Orthopedics;  Laterality: Left;    FAMILY HISTORY: Family History  Problem Relation Age of Onset  . Diabetes Mother   . Hypertension Mother   . Heart Problems Mother   . High Cholesterol Mother   . Diabetes Father   . Hypertension Father   . Kidney cancer Father        One kidney removed d/t cancer  . High Cholesterol Father   . Diabetes Brother   . Hypertension Brother   . Cancer Brother        Tonsill  . Breast cancer Neg Hx     SOCIAL HISTORY: Social History   Socioeconomic History  . Marital status: Significant Other    Spouse name: Tiffany Robles  . Number of children: 4  .  Years of education: 40  . Highest education level: 12th grade  Occupational History  . Occupation: Retired   Tobacco Use  . Smoking status: Former Smoker    Packs/day: 3.00    Years: 20.00    Pack years: 60.00    Types: Cigarettes  . Smokeless tobacco: Never Used  Vaping Use  . Vaping Use: Never used  Substance and Sexual Activity  . Alcohol use: Yes    Comment: occassionally  . Drug use: Never  . Sexual activity: Yes    Birth control/protection: None, Surgical  Other Topics Concern  . Not on file  Social History Narrative   Right handed   Drinks coffee/soda daily   Lives with significant other, Tiffany Robles   Social Determinants of Health   Financial Resource Strain: Not on file  Food Insecurity: Not on file  Transportation Needs: Not on file  Physical Activity: Not on file  Stress: Not on file  Social Connections: Not on file  Intimate Partner Violence: Not on file     PHYSICAL EXAM   There were no vitals filed for this visit. Not recorded     There is no height or weight on file to calculate BMI.  PHYSICAL EXAMNIATION:  Gen: NAD, conversant, well nourised, well groomed        NEUROLOGICAL EXAM:  MENTAL  STATUS: Speech/cognition: Awake alert oriented to history taking care of conversation,  CRANIAL NERVES: CN II: Visual fields are full to confrontation. Pupils are round equal and briskly reactive to light. CN III, IV, VI: extraocular movement are normal. No ptosis. CN V: Facial sensation is intact to light touch CN VII: Face is symmetric with normal eye closure  CN VIII: Hearing is normal to causal conversation. CN IX, X: Phonation is normal. CN XI: Head turning and shoulder shrug are intact  MOTOR: There is no pronator drift of out-stretched arms. Muscle bulk and tone are normal. Muscle strength is normal.  Well-healed left knee replacement scar, bilateral lower extremity pitting edema to mid shin level  REFLEXES: Reflexes are 1 and symmetric at the biceps, triceps, absent at knees, and absent at ankles. Plantar responses are flexor.  SENSORY: Mental dependent decreased light touch, pinprick to mid shin level  COORDINATION: There is no trunk or limb dysmetria noted.  GAIT/STANCE: She needs push-up to get up from seated position, cautious, mildly unsteady   DIAGNOSTIC DATA (LABS, IMAGING, TESTING) - I reviewed patient records, labs, notes, testing and imaging myself where available.   ASSESSMENT AND PLAN  Tiffany Robles is a 65 y.o. female   Diabetic peripheral neuropathy History of lumbar spondylosis, status post L4-5 decompression surgery, with recurrent low back pain radiating pain to bilateral lower extremity, right worse than left  Evidence of length dependent mild axonal sensorimotor polyneuropathy, consistent with her diabetes, no evidence of active lumbosacral radiculopathy, very slight chronic bilateral lumbosacral radiculopathy,  Continue gabapentin  Physical therapy,  Also suggested warm compression, back stretching, water aerobics  Marcial Pacas, M.D. Ph.D.  [facility] [address], [place], [place] [address] Ph: [phone] Fax:  [phone]  CC:  Vassie Moment, [place],  [place] [address]

## 2021-02-25 DIAGNOSIS — J309 Allergic rhinitis, unspecified: Secondary | ICD-10-CM | POA: Insufficient documentation

## 2021-03-01 ENCOUNTER — Other Ambulatory Visit: Payer: Self-pay | Admitting: Physician Assistant

## 2021-03-01 ENCOUNTER — Other Ambulatory Visit: Payer: Self-pay | Admitting: Nurse Practitioner

## 2021-03-01 DIAGNOSIS — Z122 Encounter for screening for malignant neoplasm of respiratory organs: Secondary | ICD-10-CM

## 2021-03-01 DIAGNOSIS — Z1231 Encounter for screening mammogram for malignant neoplasm of breast: Secondary | ICD-10-CM

## 2021-03-25 ENCOUNTER — Other Ambulatory Visit: Payer: Self-pay | Admitting: Nephrology

## 2021-03-25 DIAGNOSIS — N1832 Chronic kidney disease, stage 3b: Secondary | ICD-10-CM

## 2021-04-08 ENCOUNTER — Other Ambulatory Visit: Payer: Self-pay | Admitting: Nurse Practitioner

## 2021-04-08 DIAGNOSIS — E559 Vitamin D deficiency, unspecified: Secondary | ICD-10-CM | POA: Insufficient documentation

## 2021-04-08 DIAGNOSIS — Z13820 Encounter for screening for osteoporosis: Secondary | ICD-10-CM

## 2021-04-20 ENCOUNTER — Ambulatory Visit
Admission: RE | Admit: 2021-04-20 | Discharge: 2021-04-20 | Disposition: A | Discharge status: Home / Self Care | Payer: Medicare HMO | Source: Ambulatory Visit | Attending: Nurse Practitioner | Admitting: Nurse Practitioner

## 2021-04-20 ENCOUNTER — Other Ambulatory Visit: Payer: Self-pay

## 2021-04-20 DIAGNOSIS — Z1231 Encounter for screening mammogram for malignant neoplasm of breast: Secondary | ICD-10-CM

## 2021-04-22 ENCOUNTER — Ambulatory Visit: Payer: Medicare HMO

## 2021-04-22 ENCOUNTER — Other Ambulatory Visit: Payer: Medicare HMO

## 2021-04-24 ENCOUNTER — Other Ambulatory Visit: Payer: Self-pay | Admitting: Nurse Practitioner

## 2021-04-24 DIAGNOSIS — R928 Other abnormal and inconclusive findings on diagnostic imaging of breast: Secondary | ICD-10-CM

## 2021-05-07 ENCOUNTER — Ambulatory Visit
Admission: RE | Admit: 2021-05-07 | Discharge: 2021-05-07 | Disposition: A | Discharge status: Home / Self Care | Payer: Medicare HMO | Source: Ambulatory Visit | Attending: Nephrology | Admitting: Nephrology

## 2021-05-07 DIAGNOSIS — N1832 Chronic kidney disease, stage 3b: Secondary | ICD-10-CM

## 2021-05-15 ENCOUNTER — Ambulatory Visit: Payer: Medicare HMO

## 2021-05-15 ENCOUNTER — Other Ambulatory Visit: Payer: Self-pay

## 2021-05-15 ENCOUNTER — Ambulatory Visit
Admission: RE | Admit: 2021-05-15 | Discharge: 2021-05-15 | Disposition: A | Discharge status: Home / Self Care | Payer: Medicare HMO | Source: Ambulatory Visit | Attending: Nurse Practitioner | Admitting: Nurse Practitioner

## 2021-05-15 DIAGNOSIS — R928 Other abnormal and inconclusive findings on diagnostic imaging of breast: Secondary | ICD-10-CM

## 2021-08-18 IMAGING — RF DG LUMBAR SPINE 2-3V
1 series · 2 of 2 positions shown · non-contrast
Comparison: Lumbar CT myelogram 06/26/2020.

CLINICAL DATA: Surgery, elective. Additional history provided: L4-5
PLIF. Reported fluoroscopy time 1 minutes, 2 seconds, 75.65 mGy.

EXAM:
LUMBAR SPINE - 2-3 VIEW; DG C-ARM 1-60 MIN

[Series 1: run · 2 of 2 slices shown]
[im 1/2]
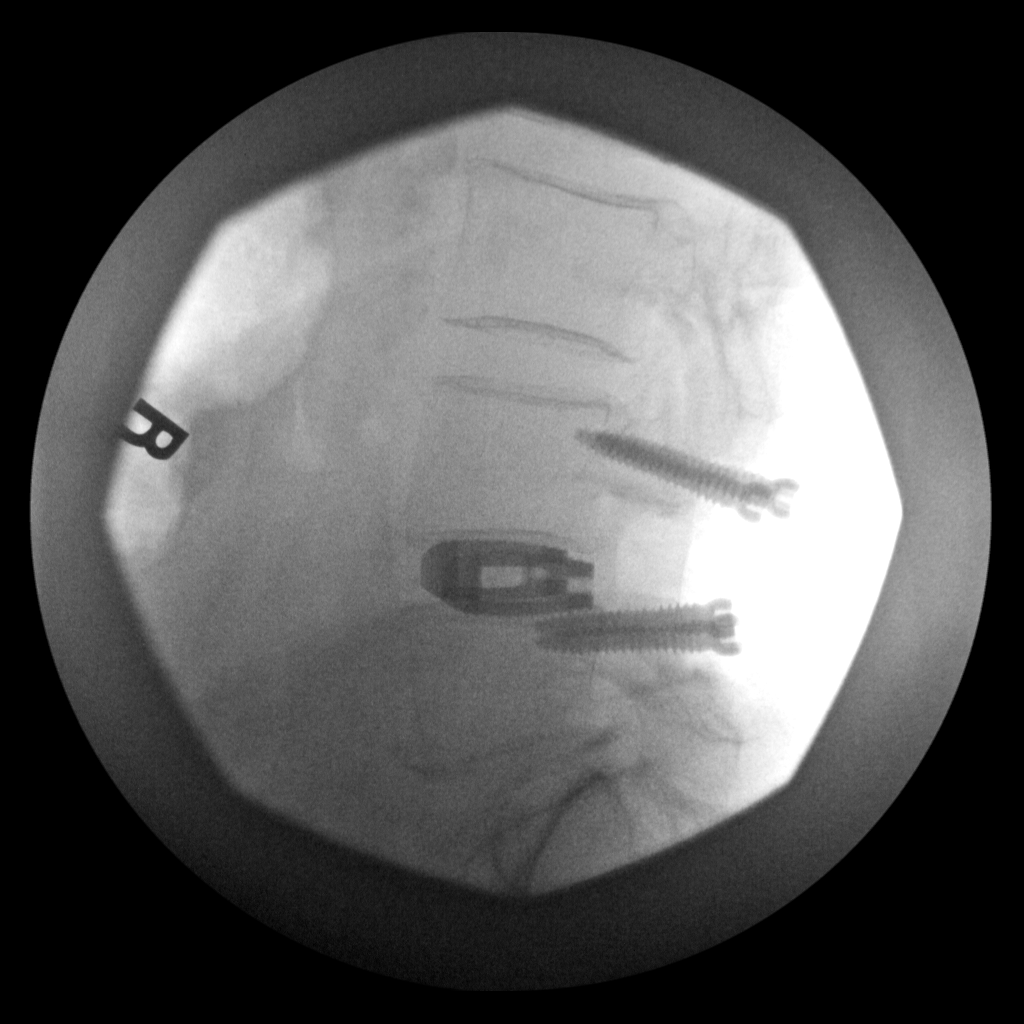
[im 2/2]
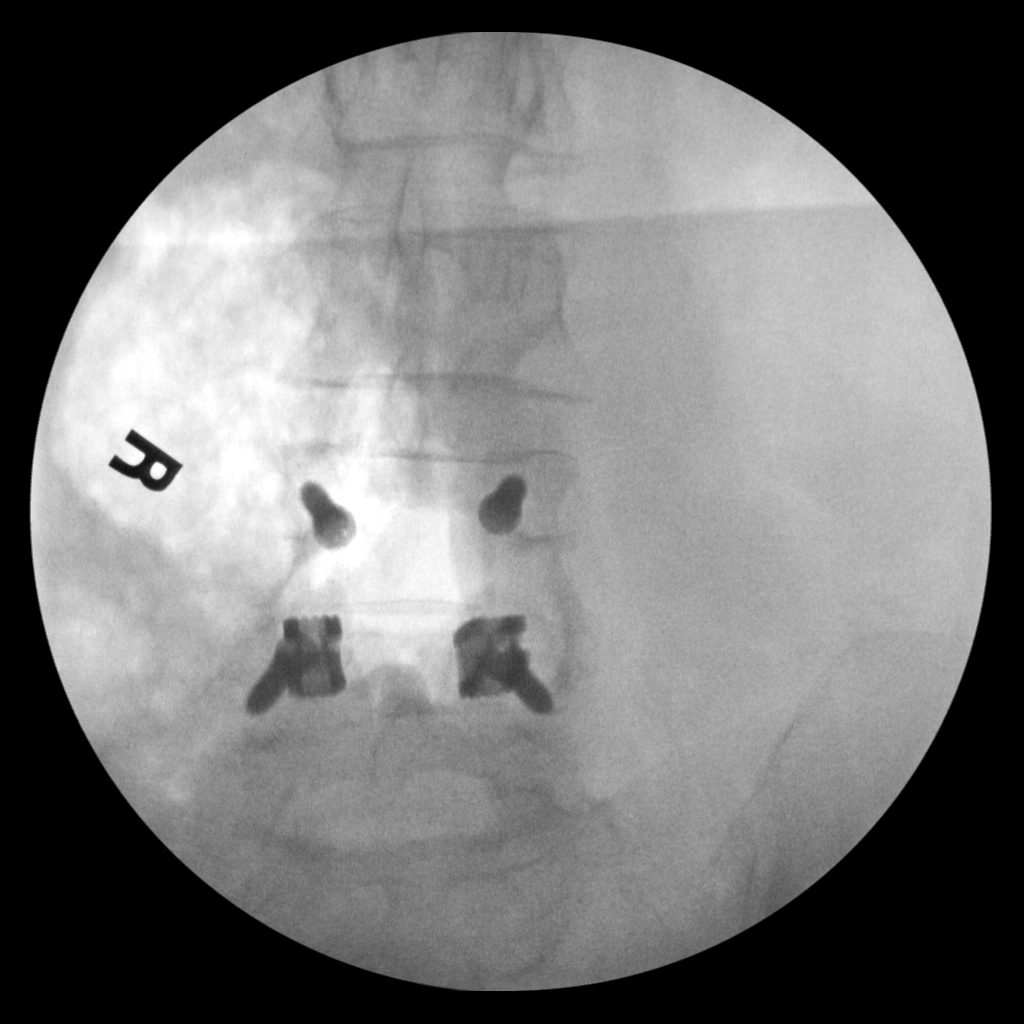

[2 of 2 positions shown; findings below may reference images not displayed]

FINDINGS: PA and lateral view intraoperative fluoroscopic images of the lumbar
spine are submitted, 2 images total. The images demonstrate
bilateral pedicle screws at the L4 and L5 levels with L4-L5
interbody spacer. Vertical interconnecting rods were not present at
the time the images were taken.
IMPRESSION: Two intraoperative fluoroscopic images of the lumbar spine from
L4-L5 PLIF as described.

## 2021-09-02 ENCOUNTER — Encounter: Payer: Self-pay | Admitting: Podiatry

## 2021-09-02 ENCOUNTER — Other Ambulatory Visit: Payer: Self-pay

## 2021-09-02 ENCOUNTER — Ambulatory Visit (INDEPENDENT_AMBULATORY_CARE_PROVIDER_SITE_OTHER): Payer: Medicare HMO | Admitting: Podiatry

## 2021-09-02 VITALS — BP 129/66 | HR 101 | Temp 97.3°F

## 2021-09-02 DIAGNOSIS — E1142 Type 2 diabetes mellitus with diabetic polyneuropathy: Secondary | ICD-10-CM

## 2021-09-02 DIAGNOSIS — B351 Tinea unguium: Secondary | ICD-10-CM

## 2021-09-02 DIAGNOSIS — M2012 Hallux valgus (acquired), left foot: Secondary | ICD-10-CM

## 2021-09-02 DIAGNOSIS — M79675 Pain in left toe(s): Secondary | ICD-10-CM

## 2021-09-02 DIAGNOSIS — M2011 Hallux valgus (acquired), right foot: Secondary | ICD-10-CM

## 2021-09-02 DIAGNOSIS — E119 Type 2 diabetes mellitus without complications: Secondary | ICD-10-CM | POA: Diagnosis not present

## 2021-09-02 DIAGNOSIS — M79674 Pain in right toe(s): Secondary | ICD-10-CM | POA: Diagnosis not present

## 2021-09-02 NOTE — Patient Instructions (Signed)

## 2021-09-02 NOTE — Progress Notes (Signed)
Subjective: Tiffany Robles presents today referred by Delford Field, FNP for diabetic foot evaluation.  Patient relates 20 year history of diabetes.  Patient denies any history of foot wounds.  Patient has been diagnosed with neuropathy and it is managed with gabapentin.  Patient relates blood glucose was 200 mg/dl this morning.   PCP is Delford Field, FNP , and last visit was 08/30/2021.  Today, patient c/o of painful, discolored, thick toenails which interfere with daily activities.  Pain is aggravated when wearing enclosed shoe gear. Duration of condition: several weeks. She has not attempted treatment.  Past Medical History:  Diagnosis Date   Anxiety    Arthritis    Asthma    childhood hx.   Cancer ([place])    30 yrs, ago, cervical   Depression    Diabetes mellitus without complication ([place])    type 2    Family history of adverse reaction to anesthesia    mother allergic to Anesthesia drug- had a very bad headache   High cholesterol    Hypertension    Pneumonia    Primary localized osteoarthritis of left knee    Syncope and collapse    first occurrence 40 years ago , last occurrence 10 years ago , saw cardio for eval , underwent echo and per patient results were normal and was told to f/u with cardio if syncope reoccurs, patient reports no reoccurence of syncope since that time .    Patient Active Problem List   Diagnosis Date Noted   Diabetic peripheral neuropathy ([place]) 02/06/2021   Right lumbar radiculopathy 01/10/2021   Paresthesia 01/10/2021   Elevated blood-pressure reading, without diagnosis of hypertension 10/02/2020   Swelling of lower limb 10/02/2020   Body mass index (BMI) 35.0-35.9, adult 09/04/2020   Spondylolisthesis at L4-L5 level 08/08/2020   Lumbago with sciatica, unspecified side 05/31/2020   Diabetes mellitus type 2, uncontrolled, with complications 22/29/7989   CKD (chronic kidney disease), stage II 03/01/2020   Depression 03/01/2020    Syncope and collapse 03/01/2020   AKI (acute kidney injury) ([place]) 03/01/2020   Hyperosmolar non-ketotic state due to type 2 diabetes mellitus ([place]) 02/29/2020   HLD (hyperlipidemia) 02/29/2020   Spinal stenosis at L4-L5 level 10/31/2019   Primary localized osteoarthritis of left knee    Essential hypertension    Diabetes mellitus without complication ([place])    Anxiety     Past Surgical History:  Procedure Laterality Date   ABDOMINAL HYSTERECTOMY     BACK SURGERY     DILATION AND CURETTAGE OF UTERUS     JOINT REPLACEMENT Left 10/26/020   Knee   LUMBAR LAMINECTOMY/DECOMPRESSION MICRODISCECTOMY Left 10/31/2019   Procedure: Laminectomy and Foraminotomy - left - Lumbar four-Lumbar five;  Surgeon: Kary Kos, MD;  Location: [facility];  Service: Neurosurgery;  Laterality: Left;   TOTAL KNEE ARTHROPLASTY Left 09/26/2019   Procedure: TOTAL KNEE ARTHROPLASTY;  Surgeon: Elsie Saas, MD;  [facility];  Service: Orthopedics;  Laterality: Left;    Current Outpatient Medications on File Prior to Visit  Medication Sig Dispense Refill   cyclobenzaprine (FLEXERIL) 10 MG tablet Take by mouth.     diazepam (VALIUM) 5 MG tablet take 1 tablet by oral route 30 minutes prior to mri     diphenhydrAMINE (BENADRYL) 25 mg capsule take 2 capsule by oral route  1 hour before mri     meloxicam (MOBIC) 7.5 MG tablet Take by mouth.     predniSONE (DELTASONE) 50 MG tablet take 1  tablet by oral route 13 hours before, 7 hours before, and then 1 hour before mri     atorvastatin (LIPITOR) 40 MG tablet Take 40 mg by mouth at bedtime.      clonazePAM (KLONOPIN) 0.5 MG tablet Take 0.5 mg by mouth 2 (two) times daily as needed (anxiety).      cyclobenzaprine (FLEXERIL) 10 MG tablet Take 10 mg by mouth 3 (three) times daily.     cyclobenzaprine (FLEXERIL) 10 MG tablet Take 1 tablet (10 mg total) by mouth 3 (three) times daily as needed for muscle spasms. 30 tablet 0   Dulaglutide (TRULICITY) 9.48 AX/6.5VV SOPN       escitalopram (LEXAPRO) 10 MG tablet Take 10 mg by mouth daily.     gabapentin (NEURONTIN) 400 MG capsule Take 2 capsules (800 mg total) by mouth 3 (three) times daily. 180 capsule 11   hydrOXYzine (ATARAX/VISTARIL) 25 MG tablet Take 25 mg by mouth 3 (three) times daily.     ibuprofen (ADVIL) 800 MG tablet      LANTUS SOLOSTAR 100 UNIT/ML Solostar Pen Inject 30 Units into the skin daily.     lisinopril-hydrochlorothiazide (ZESTORETIC) 20-25 MG tablet Take 1 tablet by mouth daily.     methylPREDNISolone (MEDROL) 4 MG tablet Take 4 mg by mouth See admin instructions. Take these number of tablets on consecutive days 6-5-4-3-2-1     metoprolol tartrate (LOPRESSOR) 25 MG tablet Take 0.5 tablets (12.5 mg total) by mouth 2 (two) times daily. 60 tablet 0   Multiple Vitamin (MULTIVITAMIN WITH MINERALS) TABS tablet Take 1 tablet by mouth in the morning and at bedtime. Complete Multivitamin for Women 50+     NOVOLOG FLEXPEN 100 UNIT/ML FlexPen Inject 15 Units into the skin 4 (four) times daily -  before meals and at bedtime.     No current facility-administered medications on file prior to visit.     Allergies  Allergen Reactions   Penicillins Swelling and Other (See Comments)    Facial swelling Did it involve swelling of the face/tongue/throat, SOB, or low BP? Yes Did it involve sudden or severe rash/hives, skin peeling, or any reaction on the inside of your mouth or nose? No Did you need to seek medical attention at a hospital or doctor's office?    #  #  YES  #  #  When did it last happen?      10+ years If all above answers are "NO", may proceed with cephalosporin use.    Augmentin [Amoxicillin-Pot Clavulanate] Nausea And Vomiting   Codeine Nausea And Vomiting   Ivp Dye [Iodinated Diagnostic Agents] Hives and Itching    Just contrast dye , no reactions to other -dines    Keflex [Cephalexin] Nausea And Vomiting   Ioversol     Social History   Occupational History   Occupation: Retired    Tobacco Use   Smoking status: Former    Packs/day: 3.00    Years: 20.00    Pack years: 60.00    Types: Cigarettes   Smokeless tobacco: Never  Vaping Use   Vaping Use: Never used  Substance and Sexual Activity   Alcohol use: Yes    Comment: occassionally   Drug use: Never   Sexual activity: Yes    Birth control/protection: None, Surgical    Family History  Problem Relation Age of Onset   Diabetes Mother    Hypertension Mother    Heart Problems Mother    High Cholesterol Mother  Diabetes Father    Hypertension Father    Kidney cancer Father        One kidney removed d/t cancer   High Cholesterol Father    Diabetes Brother    Hypertension Brother    Cancer Brother        Tonsill   Breast cancer Neg Hx      There is no immunization history on file for this patient.  Objective: Vitals:   09/02/21 0946  BP: 129/66  Pulse: (!) 101  Temp: (!) 97.3 F (36.3 C)    Tiffany Robles is a pleasant 65 y.o. female obese in NAD. AAO X 3.  Vascular Examination: Capillary refill time to digits immediate b/l. Palpable DP pulse(s) b/l lower extremities Palpable PT pulse(s) b/l lower extremities Pedal hair present. Lower extremity skin temperature gradient within normal limits. No pain with calf compression b/l. No edema noted b/l lower extremities.  Dermatological Examination: Skin warm and supple b/l lower extremities. No open wounds b/l lower extremities. No interdigital macerations b/l lower extremities. Toenails 1-5 b/l elongated, discolored, dystrophic, thickened, crumbly with subungual debris and tenderness to dorsal palpation.  Musculoskeletal Examination: Normal muscle strength 5/5 to all lower extremity muscle groups bilaterally. Hallux valgus with bunion deformity noted b/l lower extremities.  Footwear Assessment: Does the patient wear appropriate shoes? Yes. Does the patient need inserts/orthotics? No.  Neurological Examination: Pt has subjective symptoms of  neuropathy. Protective sensation intact 5/5 intact bilaterally with 10g monofilament b/l. Vibratory sensation intact b/l. Deep tendon reflexes normal b/l.   Assessment: 1. Pain due to onychomycosis of toenails of both feet   2. Hallux valgus, acquired, bilateral   3. Diabetic peripheral neuropathy ([place])   4. Encounter for diabetic foot exam ([place])     ADA Risk Categorization: Low Risk:  Patient has all of the following: Intact protective sensation No prior foot ulcer  No severe deformity Pedal pulses present  Plan: -Examined patient. -Diabetic foot examination performed today. -Continue diabetic foot care principles: inspect feet daily, monitor glucose as recommended by PCP and/or Endocrinologist, and follow prescribed diet per PCP, Endocrinologist and/or dietician. -Patient to continue soft, supportive shoe gear daily. -Toenails 1-5 b/l were debrided in length and girth with sterile nail nippers and dremel without iatrogenic bleeding.  -Patient to report any pedal injuries to medical professional immediately. -Patient/POA to call should there be question/concern in the interim.  Return in about 3 months (around 12/03/2021).  Marzetta Board, DPM

## 2021-10-14 ENCOUNTER — Other Ambulatory Visit: Payer: Medicare HMO

## 2021-10-28 DIAGNOSIS — R2232 Localized swelling, mass and lump, left upper limb: Secondary | ICD-10-CM | POA: Insufficient documentation

## 2021-11-06 ENCOUNTER — Ambulatory Visit
Admission: RE | Admit: 2021-11-06 | Discharge: 2021-11-06 | Disposition: A | Discharge status: Home / Self Care | Payer: Medicare HMO | Source: Ambulatory Visit | Attending: Nurse Practitioner | Admitting: Nurse Practitioner

## 2021-11-06 DIAGNOSIS — Z13820 Encounter for screening for osteoporosis: Secondary | ICD-10-CM

## 2021-12-10 ENCOUNTER — Other Ambulatory Visit: Payer: Self-pay

## 2021-12-10 ENCOUNTER — Ambulatory Visit (INDEPENDENT_AMBULATORY_CARE_PROVIDER_SITE_OTHER): Payer: Medicare HMO | Admitting: Podiatry

## 2021-12-10 DIAGNOSIS — M79674 Pain in right toe(s): Secondary | ICD-10-CM

## 2021-12-10 DIAGNOSIS — E1142 Type 2 diabetes mellitus with diabetic polyneuropathy: Secondary | ICD-10-CM

## 2021-12-10 DIAGNOSIS — B351 Tinea unguium: Secondary | ICD-10-CM

## 2021-12-10 DIAGNOSIS — M79675 Pain in left toe(s): Secondary | ICD-10-CM

## 2021-12-14 ENCOUNTER — Encounter: Payer: Self-pay | Admitting: Podiatry

## 2021-12-14 NOTE — Progress Notes (Signed)
°  Subjective:  Patient ID: Tiffany Robles, female    DOB: 12/06/1955,  MRN: [id number]  Willa Rough presents to clinic today for at risk foot care with history of diabetic neuropathy and painful elongated mycotic toenails 1-5 bilaterally which are tender when wearing enclosed shoe gear. Pain is relieved with periodic professional debridement.  Patient did not check blood glucose today, but states she has hd normal readings.  She notes no new pedal problems on today's visit. She and her family recently welcomed two great grandchildren six weeks apart.  PCP is Delford Field, FNP , and last visit was November, 2022.  Allergies  Allergen Reactions   Penicillins Swelling and Other (See Comments)    Facial swelling Did it involve swelling of the face/tongue/throat, SOB, or low BP? Yes Did it involve sudden or severe rash/hives, skin peeling, or any reaction on the inside of your mouth or nose? No Did you need to seek medical attention at a hospital or doctor's office?    #  #  YES  #  #  When did it last happen?      10+ years If all above answers are "NO", may proceed with cephalosporin use.    Augmentin [Amoxicillin-Pot Clavulanate] Nausea And Vomiting   Codeine Nausea And Vomiting   Iodinated Contrast Media Hives and Itching    Just contrast dye , no reactions to other -dines    Keflex [Cephalexin] Nausea And Vomiting   Clavulanic Acid    Ioversol     Review of Systems: Negative except as noted in the HPI. Objective:   Constitutional VETTA COUZENS is a pleasant 66 y.o. Caucasian female, WD, WN in NAD. AAO x 3.   Vascular CFT immediate b/l LE. Palpable DP/PT pulses b/l LE. Digital hair present b/l. Skin temperature gradient WNL b/l. No pain with calf compression b/l. No edema noted b/l. No cyanosis or clubbing noted b/l LE.  Neurologic Normal speech. Oriented to person, place, and time. Pt has subjective symptoms of neuropathy. Protective sensation intact 5/5 intact  bilaterally with 10g monofilament b/l. Vibratory sensation intact b/l. Proprioception intact bilaterally. Deep tendon reflexes normal b/l.   Dermatologic Pedal integument with normal turgor, texture and tone b/l LE. No open wounds b/l. No interdigital macerations b/l. Toenails 1-5 b/l elongated, thickened, discolored with subungual debris. +Tenderness with dorsal palpation of nailplates. No hyperkeratotic or porokeratotic lesions present.  Orthopedic: Normal muscle strength 5/5 to all lower extremity muscle groups bilaterally. HAV with bunion deformity noted b/l LE.Marland Kitchen No pain, crepitus or joint limitation noted with ROM b/l LE.  Patient ambulates independently without assistive aids.   Radiographs: None Assessment:   1. Pain due to onychomycosis of toenails of both feet   2. Diabetic peripheral neuropathy ([place])    Plan:  Patient was evaluated and treated and all questions answered. Consent given for treatment as described below: -Continue foot and shoe inspections daily. Monitor blood glucose per PCP/Endocrinologist's recommendations. -Mycotic toenails 1-5 bilaterally were debrided in length and girth with sterile nail nippers and dremel without incident. -Patient/POA to call should there be question/concern in the interim.  Return in about 3 months (around 03/10/2022).  Marzetta Board, DPM

## 2022-01-08 DIAGNOSIS — M8580 Other specified disorders of bone density and structure, unspecified site: Secondary | ICD-10-CM | POA: Insufficient documentation

## 2022-03-14 ENCOUNTER — Ambulatory Visit (INDEPENDENT_AMBULATORY_CARE_PROVIDER_SITE_OTHER): Payer: Medicare Other | Admitting: Podiatry

## 2022-03-14 ENCOUNTER — Encounter: Payer: Self-pay | Admitting: Podiatry

## 2022-03-14 DIAGNOSIS — B351 Tinea unguium: Secondary | ICD-10-CM

## 2022-03-14 DIAGNOSIS — E1142 Type 2 diabetes mellitus with diabetic polyneuropathy: Secondary | ICD-10-CM

## 2022-03-14 DIAGNOSIS — M79674 Pain in right toe(s): Secondary | ICD-10-CM

## 2022-03-14 DIAGNOSIS — M79675 Pain in left toe(s): Secondary | ICD-10-CM | POA: Diagnosis not present

## 2022-03-23 NOTE — Progress Notes (Signed)
?  Subjective:  ?Patient ID: Tiffany Robles, female    DOB: 1956-10-23,  MRN: [id number] ? ?Willa Rough presents to clinic today for at risk foot care with history of diabetic neuropathy and painful thick toenails that are difficult to trim. Pain interferes with ambulation. Aggravating factors include wearing enclosed shoe gear. Pain is relieved with periodic professional debridement. ? ?Last HgA1c was 6.2%. Patient did not check blood glucose today. ? ?New problem(s): None.  ? ?PCP is Delford Field, FNP , and last visit was January,2023. ? ?Allergies  ?Allergen Reactions  ? Penicillins Swelling and Other (See Comments)  ?  Facial swelling ?Did it involve swelling of the face/tongue/throat, SOB, or low BP? Yes ?Did it involve sudden or severe rash/hives, skin peeling, or any reaction on the inside of your mouth or nose? No ?Did you need to seek medical attention at a hospital or doctor's office?    #  #  YES  #  #  ?When did it last happen?      10+ years ?If all above answers are "NO", may proceed with cephalosporin use. ?  ? Augmentin [Amoxicillin-Pot Clavulanate] Nausea And Vomiting  ? Codeine Nausea And Vomiting  ? Iodinated Contrast Media Hives and Itching  ?  Just contrast dye , no reactions to other -dines   ? Keflex [Cephalexin] Nausea And Vomiting  ? Clavulanic Acid   ? Ioversol   ? ? ?Review of Systems: Negative except as noted in the HPI. ? ?Objective: No changes noted in today's physical examination. ? ?Constitutional Tiffany Robles is a pleasant 66 y.o. Caucasian female, WD, WN in NAD. AAO x 3.   ?Vascular CFT immediate b/l LE. Palpable DP/PT pulses b/l LE. Digital hair present b/l. Skin temperature gradient WNL b/l. No pain with calf compression b/l. No edema noted b/l. No cyanosis or clubbing noted b/l LE.  ?Neurologic Normal speech. Oriented to person, place, and time. Pt has subjective symptoms of neuropathy. Protective sensation intact 5/5 intact bilaterally with 10g monofilament b/l.  Vibratory sensation intact b/l. Proprioception intact bilaterally. Deep tendon reflexes normal b/l.   ?Dermatologic Pedal integument with normal turgor, texture and tone b/l LE. No open wounds b/l. No interdigital macerations b/l. Toenails 1-5 b/l elongated, thickened, discolored with subungual debris. +Tenderness with dorsal palpation of nailplates. No hyperkeratotic or porokeratotic lesions present.  ?Orthopedic: Normal muscle strength 5/5 to all lower extremity muscle groups bilaterally. HAV with bunion deformity noted b/l LE.Marland Kitchen No pain, crepitus or joint limitation noted with ROM b/l LE.  Patient ambulates independently without assistive aids.  ? ?Radiographs: None ? ?Assessment/Plan: ?1. Pain due to onychomycosis of toenails of both feet   ?2. Diabetic peripheral neuropathy ([place])   ?-No new findings. No new orders. ?-Patient to continue soft, supportive shoe gear daily. ?-Toenails 1-5 b/l were debrided in length and girth with sterile nail nippers and dremel without iatrogenic bleeding.  ?-Patient/POA to call should there be question/concern in the interim.  ? ?Return in about 3 months (around 06/13/2022). ? ?Marzetta Board, DPM  ?

## 2022-05-20 ENCOUNTER — Other Ambulatory Visit: Payer: Self-pay | Admitting: Nurse Practitioner

## 2022-05-20 DIAGNOSIS — Z1231 Encounter for screening mammogram for malignant neoplasm of breast: Secondary | ICD-10-CM

## 2022-05-21 ENCOUNTER — Ambulatory Visit
Admission: RE | Admit: 2022-05-21 | Discharge: 2022-05-21 | Disposition: A | Discharge status: Home / Self Care | Payer: Medicare Other | Source: Ambulatory Visit | Attending: Nurse Practitioner | Admitting: Nurse Practitioner

## 2022-05-21 DIAGNOSIS — Z1231 Encounter for screening mammogram for malignant neoplasm of breast: Secondary | ICD-10-CM

## 2022-06-16 ENCOUNTER — Ambulatory Visit: Payer: Medicare Other | Admitting: Podiatry

## 2022-08-19 ENCOUNTER — Ambulatory Visit (INDEPENDENT_AMBULATORY_CARE_PROVIDER_SITE_OTHER): Payer: Medicare Other | Admitting: Podiatry

## 2022-08-19 ENCOUNTER — Encounter: Payer: Self-pay | Admitting: Podiatry

## 2022-08-19 DIAGNOSIS — B351 Tinea unguium: Secondary | ICD-10-CM | POA: Diagnosis not present

## 2022-08-19 DIAGNOSIS — E1142 Type 2 diabetes mellitus with diabetic polyneuropathy: Secondary | ICD-10-CM

## 2022-08-19 DIAGNOSIS — M5136 Other intervertebral disc degeneration, lumbar region: Secondary | ICD-10-CM | POA: Insufficient documentation

## 2022-08-19 DIAGNOSIS — Z9229 Personal history of other drug therapy: Secondary | ICD-10-CM | POA: Insufficient documentation

## 2022-08-19 DIAGNOSIS — M79674 Pain in right toe(s): Secondary | ICD-10-CM

## 2022-08-19 DIAGNOSIS — M79675 Pain in left toe(s): Secondary | ICD-10-CM

## 2022-08-19 DIAGNOSIS — E113399 Type 2 diabetes mellitus with moderate nonproliferative diabetic retinopathy without macular edema, unspecified eye: Secondary | ICD-10-CM | POA: Insufficient documentation

## 2022-08-24 NOTE — Progress Notes (Signed)
Subjective:  Patient ID: Tiffany Robles, female    DOB: [birth year],  MRN: [id number]  Tiffany Robles presents to clinic today for at risk foot care with history of diabetic neuropathy and painful elongated mycotic toenails 1-5 bilaterally which are tender when wearing enclosed shoe gear. Pain is relieved with periodic professional debridement.  Patient states blood glucose was 143 mg/dl today. Last known  HgA1c was 7.2%.    New problem(s): None.   PCP is Delford Field, FNP , and last visit was June, 2023.  Allergies  Allergen Reactions   Penicillins Swelling and Other (See Comments)    Facial swelling Did it involve swelling of the face/tongue/throat, SOB, or low BP? Yes Did it involve sudden or severe rash/hives, skin peeling, or any reaction on the inside of your mouth or nose? No Did you need to seek medical attention at a hospital or doctor's office?    #  #  YES  #  #  When did it last happen?      10+ years If all above answers are "NO", may proceed with cephalosporin use.    Augmentin [Amoxicillin-Pot Clavulanate] Nausea And Vomiting   Codeine Nausea And Vomiting   Iodinated Contrast Media Hives and Itching    Just contrast dye , no reactions to other -dines    Keflex [Cephalexin] Nausea And Vomiting   Clavulanic Acid    Ioversol    Iodine Hives    Other Reaction(s) from [employer]: Hives, Itching     Review of Systems: Negative except as noted in the HPI.  Objective: No changes noted in today's physical examination.  Tiffany Robles is a pleasant 66 y.o. female in NAD. AAO x 3.  Vascular CFT immediate b/l LE. Palpable DP/PT pulses b/l LE. Digital hair present b/l. Skin temperature gradient WNL b/l. No pain with calf compression b/l. No edema noted b/l. No cyanosis or clubbing noted b/l LE.  Neurologic Normal speech. Oriented to person, place, and time. Pt has subjective symptoms of neuropathy. Protective sensation intact 5/5 intact bilaterally with 10g  monofilament b/l. Vibratory sensation intact b/l. Proprioception intact bilaterally. Deep tendon reflexes normal b/l.   Dermatologic Pedal integument with normal turgor, texture and tone b/l LE. No open wounds b/l. No interdigital macerations b/l. Toenails 1-5 b/l elongated, thickened, discolored with subungual debris. +Tenderness with dorsal palpation of nailplates. Incurvated nailplate medial border left hallux.  Nail border hypertrophy absent. There is tenderness to palpation. Sign(s) of infection: no clinical signs of infection noted on examination today.. No hyperkeratotic or porokeratotic lesions present.  Orthopedic: Normal muscle strength 5/5 to all lower extremity muscle groups bilaterally. HAV with bunion deformity noted b/l LE.Marland Kitchen No pain, crepitus or joint limitation noted with ROM b/l LE.  Patient ambulates independently without assistive aids.   Radiographs: None Assessment/Plan: 1. Pain due to onychomycosis of toenails of both feet   2. Diabetic peripheral neuropathy ([place])   -Examined patient. -Continue diabetic foot care principles: inspect feet daily, monitor glucose as recommended by PCP and/or Endocrinologist, and follow prescribed diet per PCP, Endocrinologist and/or dietician. -Mycotic toenails 1-5 bilaterally were debrided in length and girth with sterile nail nippers and dremel without incident. -Offending nail border debrided and curretaged medial border left hallux utilizing sterile nail nipper and currette. Border cleansed with alcohol and triple antibiotic applied. No further treatment required by patient/caregiver. Call office if there are any concerns. -Patient/POA to call should there be question/concern in the interim.   Return in  about 3 months (around 11/18/2022).  Marzetta Board, DPM

## 2022-10-05 DIAGNOSIS — G629 Polyneuropathy, unspecified: Secondary | ICD-10-CM | POA: Insufficient documentation

## 2022-12-08 ENCOUNTER — Encounter: Payer: Self-pay | Admitting: Podiatry

## 2022-12-08 ENCOUNTER — Ambulatory Visit (INDEPENDENT_AMBULATORY_CARE_PROVIDER_SITE_OTHER): Payer: Medicare Other | Admitting: Podiatry

## 2022-12-08 VITALS — BP 156/73

## 2022-12-08 DIAGNOSIS — M79674 Pain in right toe(s): Secondary | ICD-10-CM

## 2022-12-08 DIAGNOSIS — M79675 Pain in left toe(s): Secondary | ICD-10-CM | POA: Diagnosis not present

## 2022-12-08 DIAGNOSIS — E1142 Type 2 diabetes mellitus with diabetic polyneuropathy: Secondary | ICD-10-CM | POA: Diagnosis not present

## 2022-12-08 DIAGNOSIS — M2011 Hallux valgus (acquired), right foot: Secondary | ICD-10-CM | POA: Diagnosis not present

## 2022-12-08 DIAGNOSIS — M2012 Hallux valgus (acquired), left foot: Secondary | ICD-10-CM

## 2022-12-08 DIAGNOSIS — B351 Tinea unguium: Secondary | ICD-10-CM | POA: Diagnosis not present

## 2022-12-08 DIAGNOSIS — E119 Type 2 diabetes mellitus without complications: Secondary | ICD-10-CM | POA: Diagnosis not present

## 2022-12-08 NOTE — Progress Notes (Signed)
ANNUAL DIABETIC FOOT EXAM  Subjective: Tiffany Robles presents today for annual diabetic foot examination.  Chief Complaint  Patient presents with   Nail Problem    [facility] BS-[id number] A1C-6.7 PCP-Tiffany Linton Rump PCP VST-08/2022   Patient confirms h/o diabetes.  Patient relates 20 year h/o diabetes.  Patient denies any h/o foot wounds.  Patient has been diagnosed with neuropathy and it is managed with gabapentin.  Risk factors: diabetes, HTN, CKD, hyperlipidemia, h/o tobacco use in remission.  [name], FNP is patient's PCP.   Past Medical History:  Diagnosis Date   Anxiety    Arthritis    Asthma    childhood hx.   Cancer ([place])    30 yrs, ago, cervical   Depression    Diabetes mellitus without complication ([place])    type 2    Family history of adverse reaction to anesthesia    mother allergic to Anesthesia drug- had a very bad headache   High cholesterol    Hypertension    Pneumonia    Primary localized osteoarthritis of left knee    Syncope and collapse    first occurrence 40 years ago , last occurrence 10 years ago , saw cardio for eval , underwent echo and per patient results were normal and was told to f/u with cardio if syncope reoccurs, patient reports no reoccurence of syncope since that time .   Patient Active Problem List   Diagnosis Date Noted   Neuropathy 10/05/2022   COVID-19 vaccine series completed 08/19/2022   Lumbar degenerative disc disease 08/19/2022   Moderate nonproliferative diabetic retinopathy ([name]) 08/19/2022   Osteopenia 01/08/2022   Subcutaneous mass of left upper extremity 10/28/2021   Morbid obesity ([name]) 10/28/2021   Vitamin D deficiency 04/08/2021   Allergic rhinitis 02/25/2021   Diabetic peripheral neuropathy ([name]) 02/06/2021   Right lumbar radiculopathy 01/10/2021   Paresthesia 01/10/2021   Elevated blood-pressure reading, without diagnosis of hypertension 10/02/2020   Swelling of lower limb 10/02/2020   Body mass index (BMI)  35.0-35.9, adult 09/04/2020   Spondylolisthesis at L4-L5 level 08/08/2020   Lumbago with sciatica, unspecified side 05/31/2020   Diabetes mellitus type 2, uncontrolled, with complications 95/62/1308   CKD (chronic kidney disease), stage II 03/01/2020   Depression 03/01/2020   Syncope and collapse 03/01/2020   AKI (acute kidney injury) ([place]) 03/01/2020   Hyperosmolar non-ketotic state due to type 2 diabetes mellitus ([name]) 02/29/2020   HLD (hyperlipidemia) 02/29/2020   Spinal stenosis at L4-L5 level 10/31/2019   Primary localized osteoarthritis of left knee    Essential hypertension    Diabetes mellitus without complication ([name])    Anxiety    Chronic pain of left knee 04/23/2018   Gait abnormality 04/23/2018   Insomnia 04/20/2018   Snoring 04/20/2018   Colon polyp 03/10/2018   Yeast infection 02/15/2018   Screening for colon cancer 01/15/2018   Recurrent major depressive disorder, in partial remission ([name]) 01/12/2018   Past Surgical History:  Procedure Laterality Date   ABDOMINAL HYSTERECTOMY     BACK SURGERY     DILATION AND CURETTAGE OF UTERUS     JOINT REPLACEMENT Left 10/26/020   Knee   LUMBAR LAMINECTOMY/DECOMPRESSION MICRODISCECTOMY Left 10/31/2019   Procedure: Laminectomy and Foraminotomy - left - Lumbar four-Lumbar five;  Surgeon: Kary Kos, MD;  Location: [facility];  Service: Neurosurgery;  Laterality: Left;   TOTAL KNEE ARTHROPLASTY Left 09/26/2019   Procedure: TOTAL KNEE ARTHROPLASTY;  Surgeon: Elsie Saas, MD;  Location: [facility];  Service:  Orthopedics;  Laterality: Left;   Current Outpatient Medications on File Prior to Visit  Medication Sig Dispense Refill   ACCU-CHEK AVIVA PLUS test strip      Accu-Chek Softclix Lancets lancets      aspirin 81 MG EC tablet Take by mouth.     atorvastatin (LIPITOR) 80 MG tablet Take 80 mg by mouth daily.     azelastine (ASTELIN) 0.1 % nasal spray Place into the nose.     Calcium Carb-Cholecalciferol 600-10 MG-MCG TABS Take 2  tablets by mouth daily.     Cholecalciferol 50 MCG (2000 UT) CAPS Take 1 capsule by mouth daily.     clonazePAM (KLONOPIN) 0.5 MG tablet Take 0.5 mg by mouth 2 (two) times daily as needed (anxiety).      cyclobenzaprine (FLEXERIL) 10 MG tablet Take 10 mg by mouth 3 (three) times daily.     cyclobenzaprine (FLEXERIL) 10 MG tablet Take 1 tablet (10 mg total) by mouth 3 (three) times daily as needed for muscle spasms. 30 tablet 0   cyclobenzaprine (FLEXERIL) 10 MG tablet Take by mouth.     diazepam (VALIUM) 5 MG tablet take 1 tablet by oral route 30 minutes prior to mri     Diclofenac Sodium 3 % GEL Apply 1 Application topically 2 (two) times daily.     diphenhydrAMINE (BENADRYL) 25 mg capsule take 2 capsule by oral route  1 hour before mri     DROPLET PEN NEEDLES 32G X 4 MM MISC      Dulaglutide (TRULICITY) 5.02 DX/4.1OI SOPN      escitalopram (LEXAPRO) 10 MG tablet Take 10 mg by mouth daily.     FARXIGA 5 MG TABS tablet Take 5 mg by mouth every morning.     fluticasone (FLONASE) 50 MCG/ACT nasal spray Place into the nose.     gabapentin (NEURONTIN) 400 MG capsule Take 2 capsules (800 mg total) by mouth 3 (three) times daily. 180 capsule 11   gabapentin (NEURONTIN) 600 MG tablet Take 600 mg by mouth 3 (three) times daily.     GAVILYTE-C 240 g solution Take 1 mL by mouth as directed.     HUMALOG KWIKPEN 200 UNIT/ML KwikPen Inject into the skin.     hydrochlorothiazide (HYDRODIURIL) 12.5 MG tablet Take 12.5 mg by mouth daily.     hydrOXYzine (ATARAX/VISTARIL) 25 MG tablet Take 25 mg by mouth 3 (three) times daily.     ibuprofen (ADVIL) 800 MG tablet      LANTUS SOLOSTAR 100 UNIT/ML Solostar Pen Inject 30 Units into the skin daily.     lidocaine (LIDODERM) 5 % 1 patch daily.     lisinopril (ZESTRIL) 20 MG tablet Take 1 tablet by mouth daily.     lisinopril-hydrochlorothiazide (ZESTORETIC) 20-25 MG tablet Take 1 tablet by mouth daily.     meloxicam (MOBIC) 7.5 MG tablet Take by mouth.      methylPREDNISolone (MEDROL) 4 MG tablet Take 4 mg by mouth See admin instructions. Take these number of tablets on consecutive days 6-5-4-3-2-1     metoprolol tartrate (LOPRESSOR) 25 MG tablet Take 0.5 tablets (12.5 mg total) by mouth 2 (two) times daily. 60 tablet 0   Multiple Vitamin (MULTI-VITAMINS) TABS Take 1 tablet by mouth daily.     Multiple Vitamin (MULTIVITAMIN WITH MINERALS) TABS tablet Take 1 tablet by mouth in the morning and at bedtime. Complete Multivitamin for Women 50+     naproxen (NAPROSYN) 250 MG tablet Take by mouth.  NOVOLOG FLEXPEN 100 UNIT/ML FlexPen Inject 15 Units into the skin 4 (four) times daily -  before meals and at bedtime.     predniSONE (DELTASONE) 50 MG tablet take 1 tablet by oral route 13 hours before, 7 hours before, and then 1 hour before mri     traZODone (DESYREL) 150 MG tablet Take 150 mg by mouth at bedtime.     No current facility-administered medications on file prior to visit.    Allergies  Allergen Reactions   Penicillins Swelling and Other (See Comments)    Facial swelling Did it involve swelling of the face/tongue/throat, SOB, or low BP? Yes Did it involve sudden or severe rash/hives, skin peeling, or any reaction on the inside of your mouth or nose? No Did you need to seek medical attention at a hospital or doctor's office?    #  #  YES  #  #  When did it last happen?      10+ years If all above answers are "NO", may proceed with cephalosporin use.    Augmentin [Amoxicillin-Pot Clavulanate] Nausea And Vomiting   Codeine Nausea And Vomiting   Iodinated Contrast Media Hives and Itching    Just contrast dye , no reactions to other -dines    Keflex [Cephalexin] Nausea And Vomiting   Clavulanic Acid    Ioversol    Iodine Hives    Other Reaction(s) from [employer]: Hives, Itching    Social History   Occupational History   Occupation: Retired   Tobacco Use   Smoking status: Former    Packs/day: 3.00    Years: 20.00    Total pack  years: 60.00    Types: Cigarettes   Smokeless tobacco: Never  Vaping Use   Vaping Use: Never used  Substance and Sexual Activity   Alcohol use: Yes    Comment: occassionally   Drug use: Never   Sexual activity: Yes    Birth control/protection: None, Surgical   Family History  Problem Relation Age of Onset   Diabetes Mother    Hypertension Mother    Heart Problems Mother    High Cholesterol Mother    Diabetes Father    Hypertension Father    Kidney cancer Father        One kidney removed d/t cancer   High Cholesterol Father    Diabetes Brother    Hypertension Brother    Cancer Brother        Tonsill   Breast cancer Neg Hx    Immunization History  Administered Date(s) Administered   PFIZER(Purple Top)SARS-COV-2 Vaccination 02/11/2020, 03/07/2020, 10/03/2020     Review of Systems: Negative except as noted in the HPI.   Objective: Vitals:   12/08/22 1048 12/08/22 1101  BP: (!) 170/76 (!) 156/73   JULITZA RICKLES is a pleasant 67 y.o. female in NAD. AAO X 3.  Vascular Examination: Capillary refill time to digits immediate b/l. Palpable pedal pulses b/l LE. Pedal hair present. No pain with calf compression b/l. Lower extremity skin temperature gradient within normal limits. No edema noted b/l LE. No cyanosis or clubbing noted b/l LE.  Dermatological Examination: Pedal skin is warm and supple b/l LE. No open wounds b/l LE. No interdigital macerations noted b/l LE. Toenails 1-5 b/l elongated, discolored, dystrophic, thickened, crumbly with subungual debris and tenderness to dorsal palpation. No hyperkeratotic nor porokeratotic lesions present on today's visit.  Neurological Examination: Pt has subjective symptoms of neuropathy. Protective sensation intact 5/5 intact bilaterally with  10g monofilament b/l. Vibratory sensation intact b/l.  Musculoskeletal Examination: Muscle strength 5/5 to all lower extremity muscle groups bilaterally. Hallux valgus with bunion deformity  noted b/l lower extremities.  Footwear Assessment: Does the patient wear appropriate shoes? Yes. Does the patient need inserts/orthotics? No.  ADA Risk Categorization: Low Risk :  Patient has all of the following: Intact protective sensation No prior foot ulcer  No severe deformity Pedal pulses present  Assessment: 1. Pain due to onychomycosis of toenails of both feet   2. Hallux valgus, acquired, bilateral   3. Diabetic peripheral neuropathy ([place])   4. Encounter for diabetic foot exam ([place])     Plan: No orders of the defined types were placed in this encounter.  -Patient was evaluated and treated. All patient's and/or POA's questions/concerns answered on today's visit. -Diabetic foot examination performed today. -Continue foot and shoe inspections daily. Monitor blood glucose per PCP/Endocrinologist's recommendations. -Patient to continue soft, supportive shoe gear daily. -Toenails 1-5 b/l were debrided in length and girth with sterile nail nippers and dremel without iatrogenic bleeding.  -Patient/POA to call should there be question/concern in the interim. Return in about 3 months (around 03/09/2023).  Marzetta Board, DPM

## 2023-01-16 ENCOUNTER — Other Ambulatory Visit: Payer: Self-pay | Admitting: Family

## 2023-01-16 DIAGNOSIS — N644 Mastodynia: Secondary | ICD-10-CM

## 2023-01-22 ENCOUNTER — Ambulatory Visit: Payer: Medicare Other

## 2023-01-22 ENCOUNTER — Ambulatory Visit
Admission: RE | Admit: 2023-01-22 | Discharge: 2023-01-22 | Disposition: A | Discharge status: Home / Self Care | Payer: 59 | Source: Ambulatory Visit | Attending: Family | Admitting: Family

## 2023-01-22 DIAGNOSIS — N644 Mastodynia: Secondary | ICD-10-CM

## 2023-03-17 ENCOUNTER — Ambulatory Visit: Payer: Medicare Other | Admitting: Podiatry

## 2023-04-30 ENCOUNTER — Emergency Department (HOSPITAL_BASED_OUTPATIENT_CLINIC_OR_DEPARTMENT_OTHER)
Admission: EM | Admit: 2023-04-30 | Discharge: 2023-04-30 | Disposition: A | Discharge status: Home / Self Care | Payer: 59 | Attending: Emergency Medicine | Admitting: Emergency Medicine

## 2023-04-30 ENCOUNTER — Encounter (HOSPITAL_BASED_OUTPATIENT_CLINIC_OR_DEPARTMENT_OTHER): Payer: Self-pay

## 2023-04-30 ENCOUNTER — Emergency Department (HOSPITAL_BASED_OUTPATIENT_CLINIC_OR_DEPARTMENT_OTHER): Payer: 59

## 2023-04-30 ENCOUNTER — Other Ambulatory Visit: Payer: Self-pay

## 2023-04-30 ENCOUNTER — Emergency Department (HOSPITAL_BASED_OUTPATIENT_CLINIC_OR_DEPARTMENT_OTHER): Payer: 59 | Admitting: Radiology

## 2023-04-30 DIAGNOSIS — Z7982 Long term (current) use of aspirin: Secondary | ICD-10-CM | POA: Diagnosis not present

## 2023-04-30 DIAGNOSIS — M7989 Other specified soft tissue disorders: Secondary | ICD-10-CM | POA: Insufficient documentation

## 2023-04-30 DIAGNOSIS — Z79899 Other long term (current) drug therapy: Secondary | ICD-10-CM | POA: Insufficient documentation

## 2023-04-30 DIAGNOSIS — S99922A Unspecified injury of left foot, initial encounter: Secondary | ICD-10-CM | POA: Diagnosis present

## 2023-04-30 DIAGNOSIS — S9032XA Contusion of left foot, initial encounter: Secondary | ICD-10-CM | POA: Insufficient documentation

## 2023-04-30 DIAGNOSIS — Z794 Long term (current) use of insulin: Secondary | ICD-10-CM | POA: Insufficient documentation

## 2023-04-30 DIAGNOSIS — X58XXXA Exposure to other specified factors, initial encounter: Secondary | ICD-10-CM | POA: Insufficient documentation

## 2023-04-30 NOTE — ED Triage Notes (Signed)
Patient here POV from Home.  Endorses stubbing her Left Great Toe 2 Weeks ago. Since then her Left Foot has become more painful and swollen. Seeks Evaluation for an Emboli. No Leg Pain.   NAD Noted during triage. A&Ox4. Gcs 15. Ambulatory.

## 2023-04-30 NOTE — ED Provider Notes (Signed)
[facility]ERGENC[facility] DEPARTMENT AT [facility] Provider Note   CSN: [id number] Arrival date & time: 04/30/23  1108     History  Chief Complaint  Patient presents with   Foot Pain    Tiffany Robles is a 67 y.o. female.  Patient presents with left distal foot great toe pain since stubbing her foot on metal bed frame almost 2 weeks ago.  Patient had persistent swelling mild tenderness however was concerned because yesterday her entire left leg was swollen.  No fevers chills.  No shortness of breath or blood clot history no classic blood clot risk factors.  Currently no leg pain or leg swelling.       Home Medications Prior to Admission medications   Medication Sig Start Date End Date Taking? Authorizing Provider  ACCU-CHEK AVIVA PLUS test strip  11/05/21   [provider]  Accu-Chek Softclix Lancets lancets  10/28/21   [provider]  aspirin 81 MG EC tablet Take by mouth.    [provider]  atorvastatin (LIPITOR) 80 MG tablet Take 80 mg by mouth daily. 06/18/22   [provider]  azelastine (ASTELIN) 0.1 % nasal spray Place into the nose. 12/23/17   [provider]  Calcium Carb-Cholecalciferol 600-10 MG-MCG TABS Take 2 tablets by mouth daily.    [provider]  Cholecalciferol 50 MCG (2000 UT) CAPS Take 1 capsule by mouth daily. 04/08/21   [provider]  clonazePAM (KLONOPIN) 0.5 MG tablet Take 0.5 mg by mouth 2 (two) times daily as needed (anxiety).  05/29/20   [provider]  cyclobenzaprine (FLEXERIL) 10 MG tablet Take 10 mg by mouth 3 (three) times daily. 07/28/20   [provider]  cyclobenzaprine (FLEXERIL) 10 MG tablet Take 1 tablet (10 mg total) by mouth 3 (three) times daily as needed for muscle spasms. 08/09/20   Meyran, Tiana Loft, NP  cyclobenzaprine (FLEXERIL) 10 MG tablet Take by mouth. 09/04/20   [provider]  diazepam (VALIUM) 5 MG tablet take 1 tablet by oral  route 30 minutes prior to mri 12/25/20   [provider]  Diclofenac Sodium 3 % GEL Apply 1 Application topically 2 (two) times daily. 04/01/22   [provider]  diphenhydrAMINE (BENADRYL) 25 mg capsule take 2 capsule by oral route  1 hour before mri 12/25/20   [provider]  DROPLET PEN NEEDLES 32G X 4 MM MISC  07/29/21   [provider]  Dulaglutide (TRULICITY) 0.75 MG/0.5ML SOPN     [provider]  escitalopram (LEXAPRO) 10 MG tablet Take 10 mg by mouth daily. 02/14/20   [provider]  FARXIGA 5 MG TABS tablet Take 5 mg by mouth every morning. 01/22/22   [provider]  fluticasone (FLONASE) 50 MCG/ACT nasal spray Place into the nose. 08/16/21   [provider]  gabapentin (NEURONTIN) 400 MG capsule Take 2 capsules (800 mg total) by mouth 3 (three) times daily. 01/10/21   Levert Feinstein, MD  gabapentin (NEURONTIN) 600 MG tablet Take 600 mg by mouth 3 (three) times daily. 10/02/21   [provider]  GAVILYTE-C 240 g solution Take 1 mL by mouth as directed. 01/15/22   [provider]  HUMALOG KWIKPEN 200 UNIT/ML KwikPen Inject into the skin. 06/24/22   [provider]  hydrochlorothiazide (HYDRODIURIL) 12.5 MG tablet Take 12.5 mg by mouth daily. 10/02/21   [provider]  hydrOXYzine (ATARAX/VISTARIL) 25 MG tablet Take 25 mg by mouth 3 (three)  times daily. 07/18/20   [provider]  ibuprofen (ADVIL) 800 MG tablet     [provider]  LANTUS SOLOSTAR 100 UNIT/ML Solostar Pen Inject 30 Units into the skin daily. 06/24/20   [provider]  lidocaine (LIDODERM) 5 % 1 patch daily. 10/31/21   [provider]  lisinopril (ZESTRIL) 20 MG tablet Take 1 tablet by mouth daily. 06/03/21   [provider]  lisinopril-hydrochlorothiazide (ZESTORETIC) 20-25 MG tablet Take 1 tablet by mouth daily.    [provider]  meloxicam (MOBIC) 7.5 MG tablet Take by mouth.  02/26/21   [provider]  methylPREDNISolone (MEDROL) 4 MG tablet Take 4 mg by mouth See admin instructions. Take these number of tablets on consecutive days 6-5-4-3-2-1 07/28/20   [provider]  metoprolol tartrate (LOPRESSOR) 25 MG tablet Take 0.5 tablets (12.5 mg total) by mouth 2 (two) times daily. 03/03/20 07/30/21  Barnetta Chapel, MD  Multiple Vitamin (MULTI-VITAMINS) TABS Take 1 tablet by mouth daily.    [provider]  Multiple Vitamin (MULTIVITAMIN WITH MINERALS) TABS tablet Take 1 tablet by mouth in the morning and at bedtime. Complete Multivitamin for Women 50+    [provider]  naproxen (NAPROSYN) 250 MG tablet Take by mouth.    [provider]  NOVOLOG FLEXPEN 100 UNIT/ML FlexPen Inject 15 Units into the skin 4 (four) times daily -  before meals and at bedtime. 06/21/20   [provider]  predniSONE (DELTASONE) 50 MG tablet take 1 tablet by oral route 13 hours before, 7 hours before, and then 1 hour before mri 12/25/20   [provider]  traZODone (DESYREL) 150 MG tablet Take 150 mg by mouth at bedtime. 09/27/21   [provider]      Allergies    Penicillins, Augmentin [amoxicillin-pot clavulanate], Codeine, Iodinated contrast media, Keflex [cephalexin], Clavulanic acid, Ioversol, and Iodine    Review of Systems   Review of Systems  Constitutional:  Negative for chills and fever.  HENT:  Negative for congestion.   Eyes:  Negative for visual disturbance.  Respiratory:  Negative for shortness of breath.   Cardiovascular:  Positive for leg swelling. Negative for chest pain.  Gastrointestinal:  Negative for abdominal pain and vomiting.  Genitourinary:  Negative for dysuria and flank pain.  Musculoskeletal:  Negative for back pain, neck pain and neck stiffness.  Skin:  Negative for rash.  Neurological:  Negative for light-headedness and headaches.    Physical Exam Updated Vital Signs BP (!) 156/76 (BP  Location: Right Arm)   Pulse 64   Temp (!) 97 F (36.1 C) (Oral)   Resp 18   Ht 5\' 3"  (1.6 m)   Wt 97.1 kg   SpO2 99%   BMI 37.91 kg/m  Physical Exam Vitals and nursing note reviewed.  Constitutional:      General: She is not in acute distress.    Appearance: She is well-developed.  HENT:     Head: Normocephalic and atraumatic.     Mouth/Throat:     Mouth: Mucous membranes are moist.  Eyes:     General:        Right eye: No discharge.        Left eye: No discharge.     Conjunctiva/sclera: Conjunctivae normal.  Neck:     Trachea: No tracheal deviation.  Cardiovascular:     Rate and Rhythm: Normal rate.  Pulmonary:     Effort: Pulmonary effort is normal.  Abdominal:  General: There is no distension.     Palpations: Abdomen is soft.     Tenderness: There is no abdominal tenderness. There is no guarding.  Musculoskeletal:        General: Swelling and tenderness present.     Cervical back: Normal range of motion and neck supple. No rigidity.     Comments: Patient has mild swelling tenderness between great toe and second toe on the left.  Minimal swelling.  No signs of infection.  No calf tenderness.  Minimal pitting edema bilateral lower extremities.  Neurovascular intact left leg.  Skin:    General: Skin is warm.     Capillary Refill: Capillary refill takes less than 2 seconds.     Findings: No rash.  Neurological:     General: No focal deficit present.     Mental Status: She is alert.     Cranial Nerves: No cranial nerve deficit.  Psychiatric:        Mood and Affect: Mood normal.     ED Results / Procedures / Treatments   Labs (all labs ordered are listed, but only abnormal results are displayed) Labs Reviewed - No data to display  EKG None  Radiology US Venous Img Lower  Left (DVT Study)  Result Date: 04/30/2023 CLINICAL DATA:  67 year old female with left lower extremity swelling. EXAM: LEFT LOWER EXTREMITY VENOUS DOPPLER ULTRASOUND TECHNIQUE:  Gray-scale sonography with graded compression, as well as color Doppler and duplex ultrasound were performed to evaluate the left lower extremity deep venous systems from the level of the common femoral vein and including the common femoral, femoral, profunda femoral, popliteal and calf veins including the posterior tibial, peroneal and gastrocnemius veins when visible. Spectral Doppler was utilized to evaluate flow at rest and with distal augmentation maneuvers in the common femoral, femoral and popliteal veins. The contralateral common femoral vein was also evaluated for comparison. COMPARISON:  None Available. FINDINGS: LEFT LOWER EXTREMITY Common Femoral Vein: No evidence of thrombus. Normal compressibility, respiratory phasicity and response to augmentation. Central Greater Saphenous Vein: No evidence of thrombus. Normal compressibility and flow on color Doppler imaging. Central Profunda Femoral Vein: No evidence of thrombus. Normal compressibility and flow on color Doppler imaging. Femoral Vein: No evidence of thrombus. Normal compressibility, respiratory phasicity and response to augmentation. Popliteal Vein: No evidence of thrombus. Normal compressibility, respiratory phasicity and response to augmentation. Calf Veins: No evidence of thrombus. Normal compressibility and flow on color Doppler imaging. Other Findings:  None. RIGHT LOWER EXTREMITY Common Femoral Vein: No evidence of thrombus. Normal compressibility, respiratory phasicity and response to augmentation. IMPRESSION: No evidence of left lower extremity deep venous thrombosis. Marliss Coots, MD [facility] Radiology Electronically Signed   By: Marliss Coots M.D.   On: 04/30/2023 14:55   DG Foot Complete Left  Result Date: 04/30/2023 CLINICAL DATA:  Left foot pain. Injury of the first and second toes. EXAM: LEFT FOOT - COMPLETE 3 VIEW COMPARISON:  None Available. FINDINGS: Osteopenia. No fracture or  dislocation. Preserved joint spaces. Tiny plantar calcaneal spur. Minimal degenerative changes of the first metatarsophalangeal joint with some small osteophytes and sclerosis. IMPRESSION: Slight degenerative change.  Tiny calcaneal spur Electronically Signed   By: Karen Kays M.D.   On: 04/30/2023 11:59    Procedures Procedures    Medications Ordered in ED Medications - No data to display  ED Course/ Medical Decision Making/ A&P  Medical Decision Making Amount and/or Complexity of Data Reviewed Radiology: ordered.   Patient presents with persistent left foot pain and mild left leg swelling yesterday that resolved.  Patient low risk for blood clots.  Ultrasound ordered and reviewed results independently no acute blood clot.  X-ray reviewed mild arthritis no fracture.  Patient stable for discharge.        Final Clinical Impression(s) / ED Diagnoses Final diagnoses:  Contusion of left foot, initial encounter  Left leg swelling    Rx / DC Orders ED Discharge Orders     None         Blane Ohara, MD 04/30/23 1505

## 2023-04-30 NOTE — Discharge Instructions (Signed)
Use Tylenol and ice as needed for pain.  Follow-up with your doctor if worsening signs or symptoms. Your ultrasound showed no blood clot/DVT in your left leg.

## 2023-05-04 ENCOUNTER — Other Ambulatory Visit: Payer: Self-pay | Admitting: Family

## 2023-05-04 DIAGNOSIS — Z1231 Encounter for screening mammogram for malignant neoplasm of breast: Secondary | ICD-10-CM

## 2023-05-26 ENCOUNTER — Ambulatory Visit
Admission: RE | Admit: 2023-05-26 | Discharge: 2023-05-26 | Disposition: A | Discharge status: Home / Self Care | Payer: 59 | Source: Ambulatory Visit | Attending: Family | Admitting: Family

## 2023-05-26 DIAGNOSIS — Z1231 Encounter for screening mammogram for malignant neoplasm of breast: Secondary | ICD-10-CM

## 2023-07-14 ENCOUNTER — Ambulatory Visit: Payer: 59 | Admitting: Podiatry

## 2023-07-14 DIAGNOSIS — F329 Major depressive disorder, single episode, unspecified: Secondary | ICD-10-CM | POA: Insufficient documentation

## 2023-07-24 ENCOUNTER — Encounter: Payer: Self-pay | Admitting: Podiatry

## 2023-07-24 ENCOUNTER — Ambulatory Visit: Payer: 59 | Admitting: Podiatry

## 2023-07-24 DIAGNOSIS — M79675 Pain in left toe(s): Secondary | ICD-10-CM

## 2023-07-24 DIAGNOSIS — E1142 Type 2 diabetes mellitus with diabetic polyneuropathy: Secondary | ICD-10-CM

## 2023-07-24 DIAGNOSIS — B351 Tinea unguium: Secondary | ICD-10-CM | POA: Diagnosis not present

## 2023-07-24 DIAGNOSIS — M79674 Pain in right toe(s): Secondary | ICD-10-CM

## 2023-07-24 NOTE — Progress Notes (Signed)
This patient returns to my office for at risk foot care.  This patient requires this care by a professional since this patient will be at risk due to having diabetes and CKD.  This patient is unable to cut nails herself since the patient cannot reach her nails.These nails are painful walking and wearing shoes.  This patient presents for at risk foot care today.  General Appearance  Alert, conversant and in no acute stress.  Vascular  Dorsalis pedis and posterior tibial  pulses are palpable  bilaterally.  Capillary return is within normal limits  bilaterally. Temperature is within normal limits  bilaterally.  Neurologic  Senn-Weinstein monofilament wire test within normal limits  bilaterally. Muscle power within normal limits bilaterally.  Nails Thick disfigured discolored nails with subungual debris  from hallux to fifth toes bilaterally. No evidence of bacterial infection or drainage bilaterally.  Orthopedic  No limitations of motion  feet .  No crepitus or effusions noted.  No bony pathology or digital deformities noted.  Skin  normotropic skin with no porokeratosis noted bilaterally.  No signs of infections or ulcers noted.     Onychomycosis  Pain in right toes  Pain in left toes  Consent was obtained for treatment procedures.   Mechanical debridement of nails 1-5  bilaterally performed with a nail nipper.  Filed with dremel without incident.    Return office visit      3 months                Told patient to return for periodic foot care and evaluation due to potential at risk complications.   Gregory Mayer DPM   

## 2023-11-11 ENCOUNTER — Encounter: Payer: Self-pay | Admitting: Podiatry

## 2023-11-11 ENCOUNTER — Ambulatory Visit (INDEPENDENT_AMBULATORY_CARE_PROVIDER_SITE_OTHER): Payer: 59 | Admitting: Podiatry

## 2023-11-11 VITALS — Ht 63.0 in | Wt 214.0 lb

## 2023-11-11 DIAGNOSIS — E1142 Type 2 diabetes mellitus with diabetic polyneuropathy: Secondary | ICD-10-CM | POA: Diagnosis not present

## 2023-11-11 DIAGNOSIS — M79675 Pain in left toe(s): Secondary | ICD-10-CM | POA: Diagnosis not present

## 2023-11-11 DIAGNOSIS — M79674 Pain in right toe(s): Secondary | ICD-10-CM

## 2023-11-11 DIAGNOSIS — B351 Tinea unguium: Secondary | ICD-10-CM | POA: Diagnosis not present

## 2023-11-19 NOTE — Progress Notes (Signed)
Subjective:  Patient ID: Tiffany Robles, female    DOB: 1956-10-25,  MRN: [id number]  Tiffany Robles presents to clinic today for: at risk foot care with history of diabetic neuropathy and painful elongated mycotic toenails 1-5 bilaterally which are tender when wearing enclosed shoe gear. Pain is relieved with periodic professional debridement.  Chief Complaint  Patient presents with   Nail Problem    Pt is here for [facility] last A1C was 6.6 PCP is Dr Samuel Bouche and LOV was last month.    PCP is Donato Schultz, FNP.  Allergies  Allergen Reactions   Penicillins Swelling and Other (See Comments)    Facial swelling Did it involve swelling of the face/tongue/throat, SOB, or low BP? Yes Did it involve sudden or severe rash/hives, skin peeling, or any reaction on the inside of your mouth or nose? No Did you need to seek medical attention at a hospital or doctor's office?    #  #  YES  #  #  When did it last happen?      10+ years If all above answers are "NO", may proceed with cephalosporin use.    Augmentin [Amoxicillin-Pot Clavulanate] Nausea And Vomiting   Codeine Nausea And Vomiting   Iodinated Contrast Media Hives and Itching    Just contrast dye , no reactions to other -dines    Keflex [Cephalexin] Nausea And Vomiting   Clavulanic Acid    Fd&C Blue #1 (Brilliant Blue)     Other Reaction(s) from [employer]: Hives, Itching   Ioversol    Iodine Hives    Other Reaction(s) from [employer]: Hives, Itching     Review of Systems: Negative except as noted in the HPI.  Objective: No changes noted in today's physical examination. There were no vitals filed for this visit.  Tiffany Robles is a pleasant 67 y.o. female in NAD. AAO x 3.  Vascular Examination: Capillary refill time <3 seconds b/l LE. Palpable pedal pulses b/l LE. Digital hair present b/l. No pedal edema b/l. Skin temperature gradient WNL b/l. No varicosities b/l. Marland Kitchen  Dermatological Examination: Pedal skin with  normal turgor, texture and tone b/l. No open wounds. No interdigital macerations b/l. Toenails 1-5 b/l thickened, discolored, dystrophic with subungual debris. There is pain on palpation to dorsal aspect of nailplates. No hyperkeratotic nor porokeratotic lesions present on today's visit.Marland Kitchen  Neurological Examination: Protective sensation intact with 10 gram monofilament b/l LE. Vibratory sensation intact b/l LE. Pt has subjective symptoms of neuropathy.  Musculoskeletal Examination: Muscle strength 5/5 to all lower extremity muscle groups bilaterally. No pain, crepitus or joint limitation noted with ROM bilateral LE. HAV with bunion deformity noted b/l LE.  Assessment/Plan: 1. Pain due to onychomycosis of toenails of both feet   2. Diabetic peripheral neuropathy (HCC)     -Consent given for treatment as described below: -Examined patient. -Continue foot and shoe inspections daily. Monitor blood glucose per PCP/Endocrinologist's recommendations. -Continue supportive shoe gear daily. -Mycotic toenails 1-5 bilaterally were debrided in length and girth with sterile nail nippers and dremel without incident. -Patient/POA to call should there be question/concern in the interim.   Return in about 3 months (around 02/09/2024).  Freddie Breech, DPM      [place] LOCATION: [address]  [place], [place] [address]                   Office [phone]   [facility] LOCATION: [address] [place], [place] [address] Office [phone]

## 2024-02-10 ENCOUNTER — Ambulatory Visit: Payer: 59 | Admitting: Podiatry

## 2024-02-10 ENCOUNTER — Encounter: Payer: Self-pay | Admitting: Podiatry

## 2024-02-10 VITALS — Ht 63.0 in | Wt 214.0 lb

## 2024-02-10 DIAGNOSIS — M79675 Pain in left toe(s): Secondary | ICD-10-CM | POA: Diagnosis not present

## 2024-02-10 DIAGNOSIS — M2012 Hallux valgus (acquired), left foot: Secondary | ICD-10-CM

## 2024-02-10 DIAGNOSIS — M2011 Hallux valgus (acquired), right foot: Secondary | ICD-10-CM

## 2024-02-10 DIAGNOSIS — M79674 Pain in right toe(s): Secondary | ICD-10-CM | POA: Diagnosis not present

## 2024-02-10 DIAGNOSIS — E119 Type 2 diabetes mellitus without complications: Secondary | ICD-10-CM | POA: Diagnosis not present

## 2024-02-10 DIAGNOSIS — E1142 Type 2 diabetes mellitus with diabetic polyneuropathy: Secondary | ICD-10-CM

## 2024-02-10 DIAGNOSIS — B351 Tinea unguium: Secondary | ICD-10-CM

## 2024-02-10 NOTE — Progress Notes (Signed)
 ANNUAL DIABETIC FOOT EXAM  Subjective: Tiffany Robles presents today for annual diabetic foot exam. Patient states she suffered a tibial plateau fracture after a fall in [place], [place]. She saw Orthopedics here in town and they are managing conservatively.  She voices no new pedal concerns. Chief Complaint  Patient presents with   Nail Problem    Pt is here for [facility] last A1C was 6.1 PCP is Dr Samuel Bouche was yesterday.   Patient confirms h/o diabetes.  Patient denies any h/o foot wounds.  Patient has been diagnosed with neuropathy.  Donato Schultz, FNP is patient's PCP.  Past Medical History:  Diagnosis Date   Anxiety    Arthritis    Asthma    childhood hx.   Cancer (HCC)    30 yrs, ago, cervical   Depression    Diabetes mellitus without complication (HCC)    type 2    Family history of adverse reaction to anesthesia    mother allergic to Anesthesia drug- had a very bad headache   High cholesterol    Hypertension    Pneumonia    Primary localized osteoarthritis of left knee    Syncope and collapse    first occurrence 40 years ago , last occurrence 10 years ago , saw cardio for eval , underwent echo and per patient results were normal and was told to f/u with cardio if syncope reoccurs, patient reports no reoccurence of syncope since that time .   Patient Active Problem List   Diagnosis Date Noted   Major depressive disorder 07/14/2023   Neuropathy 10/05/2022   COVID-19 vaccine series completed 08/19/2022   Lumbar degenerative disc disease 08/19/2022   Moderate nonproliferative diabetic retinopathy (HCC) 08/19/2022   Osteopenia 01/08/2022   Subcutaneous mass of left upper extremity 10/28/2021   Morbid obesity (HCC) 10/28/2021   Vitamin D deficiency 04/08/2021   Allergic rhinitis 02/25/2021   Diabetic peripheral neuropathy (HCC) 02/06/2021   Right lumbar radiculopathy 01/10/2021   Paresthesia 01/10/2021   Elevated blood-pressure reading, without diagnosis of  hypertension 10/02/2020   Swelling of lower limb 10/02/2020   Body mass index (BMI) 35.0-35.9, adult 09/04/2020   Spondylolisthesis at L4-L5 level 08/08/2020   Lumbago with sciatica, unspecified side 05/31/2020   Diabetes mellitus type 2, uncontrolled, with complications 03/01/2020   CKD (chronic kidney disease), stage II 03/01/2020   Depression 03/01/2020   Syncope and collapse 03/01/2020   AKI (acute kidney injury) (HCC) 03/01/2020   Hyperosmolar non-ketotic state due to type 2 diabetes mellitus (HCC) 02/29/2020   HLD (hyperlipidemia) 02/29/2020   Spinal stenosis at L4-L5 level 10/31/2019   Primary localized osteoarthritis of left knee    Essential hypertension    Diabetes mellitus without complication (HCC)    Anxiety    Chronic pain of left knee 04/23/2018   Gait abnormality 04/23/2018   Insomnia 04/20/2018   Snoring 04/20/2018   Colon polyp 03/10/2018   Yeast infection 02/15/2018   Depression with anxiety 02/15/2018   Screening for colon cancer 01/15/2018   Recurrent major depressive disorder, in partial remission (HCC) 01/12/2018   Past Surgical History:  Procedure Laterality Date   ABDOMINAL HYSTERECTOMY     BACK SURGERY     DILATION AND CURETTAGE OF UTERUS     JOINT REPLACEMENT Left 10/26/020   Knee   LUMBAR LAMINECTOMY/DECOMPRESSION MICRODISCECTOMY Left 10/31/2019   Procedure: Laminectomy and Foraminotomy - left - Lumbar four-Lumbar five;  Surgeon: Donalee Citrin, MD;  Location: [facility] OR;  Service: Neurosurgery;  Laterality: Left;   TOTAL KNEE ARTHROPLASTY Left 09/26/2019   Procedure: TOTAL KNEE ARTHROPLASTY;  Surgeon: Salvatore Marvel, MD;  Location: [facility] ORS;  Service: Orthopedics;  Laterality: Left;   Current Outpatient Medications on File Prior to Visit  Medication Sig Dispense Refill   ACCU-CHEK AVIVA PLUS test strip      Accu-Chek Softclix Lancets lancets      aspirin 81 MG EC tablet Take by mouth.     atorvastatin (LIPITOR) 80 MG tablet Take 80 mg by mouth daily.      azelastine (ASTELIN) 0.1 % nasal spray Place into the nose.     Calcium Carb-Cholecalciferol 600-10 MG-MCG TABS Take 2 tablets by mouth daily.     Cholecalciferol 50 MCG (2000 UT) CAPS Take 1 capsule by mouth daily.     clonazePAM (KLONOPIN) 0.5 MG tablet Take 0.5 mg by mouth 2 (two) times daily as needed (anxiety).      cyclobenzaprine (FLEXERIL) 10 MG tablet Take by mouth.     diazepam (VALIUM) 5 MG tablet take 1 tablet by oral route 30 minutes prior to mri     Diclofenac Sodium 3 % GEL Apply 1 Application topically 2 (two) times daily.     diphenhydrAMINE (BENADRYL) 25 mg capsule take 2 capsule by oral route  1 hour before mri     DROPLET PEN NEEDLES 32G X 4 MM MISC      escitalopram (LEXAPRO) 10 MG tablet Take 10 mg by mouth daily.     FARXIGA 5 MG TABS tablet Take 5 mg by mouth every morning.     fluticasone (FLONASE) 50 MCG/ACT nasal spray Place into the nose.     gabapentin (NEURONTIN) 600 MG tablet Take 600 mg by mouth 3 (three) times daily.     GAVILYTE-C 240 g solution Take 1 mL by mouth as directed.     hydrochlorothiazide (HYDRODIURIL) 12.5 MG tablet Take 12.5 mg by mouth daily.     hydrOXYzine (ATARAX/VISTARIL) 25 MG tablet Take 25 mg by mouth 3 (three) times daily.     ibuprofen (ADVIL) 800 MG tablet      insulin lispro (HUMALOG) 100 UNIT/ML KwikPen Inject into the skin.     LANTUS SOLOSTAR 100 UNIT/ML Solostar Pen Inject 30 Units into the skin daily.     lidocaine (LIDODERM) 5 % 1 patch daily.     lisinopril (ZESTRIL) 40 MG tablet Take 40 mg by mouth daily.     lisinopril-hydrochlorothiazide (ZESTORETIC) 20-25 MG tablet Take 1 tablet by mouth daily.     meloxicam (MOBIC) 7.5 MG tablet Take by mouth.     metFORMIN (GLUCOPHAGE) 500 MG tablet Take 1 tablet by mouth 2 (two) times daily with a meal.     metoprolol tartrate (LOPRESSOR) 50 MG tablet Take 50 mg by mouth 2 (two) times daily.     Multiple Vitamin (MULTI-VITAMINS) TABS Take 1 tablet by mouth daily.     naproxen  (NAPROSYN) 250 MG tablet Take by mouth.     NOVOLOG FLEXPEN 100 UNIT/ML FlexPen Inject 15 Units into the skin 4 (four) times daily -  before meals and at bedtime.     traZODone (DESYREL) 150 MG tablet Take 150 mg by mouth at bedtime.     TRULICITY 4.5 MG/0.5ML SOPN Inject into the skin.     No current facility-administered medications on file prior to visit.    Allergies  Allergen Reactions   Penicillins Swelling and Other (See Comments)    Facial swelling Did it  involve swelling of the face/tongue/throat, SOB, or low BP? Yes Did it involve sudden or severe rash/hives, skin peeling, or any reaction on the inside of your mouth or nose? No Did you need to seek medical attention at a hospital or doctor's office?    #  #  YES  #  #  When did it last happen?      10+ years If all above answers are "NO", may proceed with cephalosporin use.    Augmentin [Amoxicillin-Pot Clavulanate] Nausea And Vomiting   Codeine Nausea And Vomiting   Iodinated Contrast Media Hives and Itching    Just contrast dye , no reactions to other -dines    Keflex [Cephalexin] Nausea And Vomiting   Clavulanic Acid    Fd&C Blue #1 (Brilliant Blue)     Other Reaction(s) from [employer]: Hives, Itching   Ioversol    Iodine Hives    Other Reaction(s) from [employer]: Hives, Itching    Social History   Occupational History   Occupation: Retired   Tobacco Use   Smoking status: Former    Current packs/day: 3.00    Average packs/day: 3.0 packs/day for 20.0 years (60.0 ttl pk-yrs)    Types: Cigarettes   Smokeless tobacco: Never  Vaping Use   Vaping status: Never Used  Substance and Sexual Activity   Alcohol use: Yes    Comment: occassionally   Drug use: Never   Sexual activity: Yes    Birth control/protection: None, Surgical   Family History  Problem Relation Age of Onset   Diabetes Mother    Hypertension Mother    Heart Problems Mother    High Cholesterol Mother    Diabetes Father    Hypertension  Father    Kidney cancer Father        One kidney removed d/t cancer   High Cholesterol Father    Diabetes Brother    Hypertension Brother    Cancer Brother        Tonsill   Breast cancer Neg Hx    Immunization History  Administered Date(s) Administered   Hepatitis A, Adult 12/18/2017   Influenza Split 09/08/2019, 09/06/2020   Influenza, High Dose Seasonal PF 08/06/2019, 08/30/2021, 10/01/2022   Influenza, Seasonal, Injecte, Preservative Fre 09/24/2018   Influenza,inj,Quad PF,6+ Mos 08/21/2017   Influenza,inj,quad, With Preservative 08/21/2017   Moderna Covid-19 Fall Seasonal Vaccine 15yrs & older 10/08/2023   PFIZER(Purple Top)SARS-COV-2 Vaccination 02/11/2020, 03/07/2020, 10/03/2020   Pfizer(Comirnaty)Fall Seasonal Vaccine 12 years and older 01/16/2023   Pneumococcal Conjugate,unspecified 03/31/2022   Pneumococcal Conjugate-13 10/02/2014   Pneumococcal Polysaccharide-23 06/01/2018, 09/08/2019, 08/30/2021   Tdap 06/08/2015, 03/31/2022   Zoster Recombinant(Shingrix) 09/06/2020, 04/01/2021     Review of Systems: Negative except as noted in the HPI.   Objective: There were no vitals filed for this visit.  ANAYA BOVEE is a pleasant 68 y.o. female in NAD. AAO X 3.  Diabetic foot exam was performed with the following findings:   Vascular Examination: Capillary refill time immediate b/l. Vascular status intact b/l with palpable pedal pulses. Pedal hair present b/l. No pain with calf compression b/l. Skin temperature gradient WNL b/l. No cyanosis or clubbing b/l. No ischemia or gangrene noted b/l.   Neurological Examination: Sensation grossly intact b/l with 10 gram monofilament. Vibratory sensation intact b/l. Pt has subjective symptoms of neuropathy.  Dermatological Examination: Pedal skin with normal turgor, texture and tone b/l.  No open wounds. No interdigital macerations.   Toenails 1-5 b/l thick, discolored, elongated  with subungual debris and pain on dorsal  palpation.   No corns, calluses nor porokeratotic lesions noted.  Musculoskeletal Examination: Muscle strength 5/5 to all lower extremity muscle groups bilaterally. HAV with bunion deformity noted b/l LE. Utilizes walker for ambulation assistance.  Radiographs: None     Lab Results  Component Value Date   HGBA1C 8.2 (H) 08/07/2020    ADA Risk Categorization: Low Risk :  Patient has all of the following: Intact protective sensation No prior foot ulcer  No severe deformity Pedal pulses present  Assessment: 1. Pain due to onychomycosis of toenails of both feet   2. Hallux valgus, acquired, bilateral   3. Diabetic peripheral neuropathy (HCC)   4. Encounter for diabetic foot exam (HCC)     Plan: Diabetic foot examination performed today. All patient's and/or POA's questions/concerns addressed on today's visit. Toenails 1-5 debrided in length and girth without incident. Continue foot and shoe inspections daily. Monitor blood glucose per PCP/Endocrinologist's recommendations. Continue soft, supportive shoe gear daily. Report any pedal injuries to medical professional. Call office if there are any questions/concerns. -Patient/POA to call should there be question/concern in the interim. Return in about 3 months (around 05/12/2024).  Freddie Breech, DPM      [place] LOCATION: [address] [place] [address]                   Office [phone]   [facility] LOCATION: [address] [place], [place] [address] Office [phone]

## 2024-05-17 ENCOUNTER — Ambulatory Visit (INDEPENDENT_AMBULATORY_CARE_PROVIDER_SITE_OTHER): Payer: 59 | Admitting: Podiatry

## 2024-05-17 DIAGNOSIS — Z91198 Patient's noncompliance with other medical treatment and regimen for other reason: Secondary | ICD-10-CM

## 2024-05-22 NOTE — Progress Notes (Signed)
 1. Failure to attend appointment with reason given   Appointment rescheduled.

## 2024-05-31 ENCOUNTER — Ambulatory Visit: Admitting: Podiatry

## 2024-05-31 DIAGNOSIS — Z91199 Patient's noncompliance with other medical treatment and regimen due to unspecified reason: Secondary | ICD-10-CM

## 2024-06-02 NOTE — Progress Notes (Signed)
 1. No-show for appointment
